# Patient Record
Sex: Male | Born: 1955
Health system: Southern US, Community
[De-identification: ages and names within clinical notes are randomized; demographics above are authoritative.]

## PROBLEM LIST (undated history)

## (undated) DIAGNOSIS — F909 Attention-deficit hyperactivity disorder, unspecified type: Secondary | ICD-10-CM

## (undated) DIAGNOSIS — T7840XA Allergy, unspecified, initial encounter: Secondary | ICD-10-CM

## (undated) DIAGNOSIS — N529 Male erectile dysfunction, unspecified: Secondary | ICD-10-CM

## (undated) DIAGNOSIS — F3181 Bipolar II disorder: Secondary | ICD-10-CM

## (undated) DIAGNOSIS — F419 Anxiety disorder, unspecified: Secondary | ICD-10-CM

## (undated) DIAGNOSIS — E785 Hyperlipidemia, unspecified: Secondary | ICD-10-CM

## (undated) DIAGNOSIS — F32A Depression, unspecified: Secondary | ICD-10-CM

## (undated) DIAGNOSIS — G0490 Encephalitis and encephalomyelitis, unspecified: Secondary | ICD-10-CM

## (undated) DIAGNOSIS — E559 Vitamin D deficiency, unspecified: Secondary | ICD-10-CM

## (undated) DIAGNOSIS — F329 Major depressive disorder, single episode, unspecified: Secondary | ICD-10-CM

## (undated) DIAGNOSIS — K5790 Diverticulosis of intestine, part unspecified, without perforation or abscess without bleeding: Secondary | ICD-10-CM

## (undated) DIAGNOSIS — Z86010 Personal history of colonic polyps: Secondary | ICD-10-CM

## (undated) HISTORY — PX: MOUTH SURGERY: SHX715

## (undated) HISTORY — DX: Allergy, unspecified, initial encounter: T78.40XA

## (undated) HISTORY — DX: Attention-deficit hyperactivity disorder, unspecified type: F90.9

## (undated) HISTORY — PX: BRAIN SURGERY: SHX531

## (undated) HISTORY — DX: Hyperlipidemia, unspecified: E78.5

## (undated) HISTORY — DX: Depression, unspecified: F32.A

## (undated) HISTORY — DX: Bipolar II disorder: F31.81

## (undated) HISTORY — DX: Vitamin D deficiency, unspecified: E55.9

## (undated) HISTORY — DX: Encephalitis and encephalomyelitis, unspecified: G04.90

## (undated) HISTORY — DX: Major depressive disorder, single episode, unspecified: F32.9

## (undated) HISTORY — PX: COLONOSCOPY: SHX174

## (undated) HISTORY — DX: Anxiety disorder, unspecified: F41.9

## (undated) HISTORY — DX: Male erectile dysfunction, unspecified: N52.9

## (undated) HISTORY — PX: TOE SURGERY: SHX1073

---
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
## 2016-07-10 ENCOUNTER — Ambulatory Visit (INDEPENDENT_AMBULATORY_CARE_PROVIDER_SITE_OTHER): Payer: 59 | Admitting: Family Medicine

## 2016-07-10 ENCOUNTER — Telehealth: Payer: Self-pay

## 2016-07-10 ENCOUNTER — Encounter: Payer: Self-pay | Admitting: Family Medicine

## 2016-07-10 VITALS — BP 134/78 | HR 63 | Temp 98.3°F | Ht 69.0 in | Wt 187.8 lb

## 2016-07-10 DIAGNOSIS — E785 Hyperlipidemia, unspecified: Secondary | ICD-10-CM | POA: Diagnosis not present

## 2016-07-10 DIAGNOSIS — F909 Attention-deficit hyperactivity disorder, unspecified type: Secondary | ICD-10-CM

## 2016-07-10 DIAGNOSIS — F3181 Bipolar II disorder: Secondary | ICD-10-CM

## 2016-07-10 DIAGNOSIS — N529 Male erectile dysfunction, unspecified: Secondary | ICD-10-CM | POA: Diagnosis not present

## 2016-07-10 MED ORDER — ARIPIPRAZOLE 5 MG PO TABS: 5.0000 mg | ORAL_TABLET | Freq: Every day | ORAL | 3 refills | Status: DC

## 2016-07-10 MED ORDER — AMPHETAMINE-DEXTROAMPHETAMINE 20 MG PO TABS: 20.0000 mg | ORAL_TABLET | Freq: Two times a day (BID) | ORAL | 0 refills | Status: DC

## 2016-07-10 MED ORDER — AMITRIPTYLINE HCL 10 MG PO TABS: 10.0000 mg | ORAL_TABLET | Freq: Every day | ORAL | 3 refills | Status: DC

## 2016-07-10 MED ORDER — ESCITALOPRAM OXALATE 20 MG PO TABS: 20.0000 mg | ORAL_TABLET | Freq: Every day | ORAL | 3 refills | Status: DC

## 2016-07-10 NOTE — Telephone Encounter (Signed)
ROI faxed to [facility] and [place].

## 2016-07-10 NOTE — Progress Notes (Signed)
Michael Durham is a 61 y.o. male is here to [place].   Patient Care Team: Briscoe Deutscher, DO as PCP - General (Family Medicine)   History of Present Illness:   Michael Durham, CMA, acting as scribe for Dr. Juleen China.  HPI  Patient comes in today to establish care.  Needs refills on his medications, but cannot remember the names of his medications with the exception of Abilify.  Recently moved to the area and needs a new PCP.  Previously was being seen in [place].  No concerns or complaints today.    Health Maintenance Due  Topic Date Due  . Hepatitis C Screening  [birth year]  . HIV Screening  07/22/1968  . TETANUS/TDAP  07/22/1972  . COLONOSCOPY  07/23/2003   PMHx, SurgHx, SocialHx, Medications, and Allergies were reviewed in the Visit Navigator and updated as appropriate.   Past Medical History:  Diagnosis Date  . ADHD   . Anxiety   . Bipolar 2 disorder (Michael Durham)   . Depression   . ED (erectile dysfunction)   . Encephalitis, Age 67   . HLD (hyperlipidemia)   . Vitamin D deficiency    Past Surgical History:  Procedure Laterality Date  . BRAIN SURGERY     Family History  Problem Relation Age of Onset  . Cancer Father   . Diabetes Brother    Social History  Substance Use Topics  . Smoking status: Former Research scientist (life sciences)  . Smokeless tobacco: Never Used  . Alcohol use Yes     Comment: occasionally   Current Medications and Allergies:   Current Outpatient Prescriptions:  .  amitriptyline (ELAVIL) 10 MG tablet, Take 1-2 tablets (10-20 mg total) by mouth at bedtime., Disp: 60 tablet, Rfl: 3 .  ARIPiprazole (ABILIFY) 5 MG tablet, Take 1 tablet (5 mg total) by mouth at bedtime., Disp: 30 tablet, Rfl: 3 .  atorvastatin (LIPITOR) 20 MG tablet, Take 20 mg by mouth daily., Disp: , Rfl: 2 .  Cholecalciferol (VITAMIN D) 2000 units CAPS, Take 2,000 Units by mouth daily., Disp: , Rfl:  .  escitalopram (LEXAPRO) 20 MG tablet, Take 1 tablet (20 mg total) by mouth daily., Disp: 30  tablet, Rfl: 3 .  tadalafil (CIALIS) 20 MG tablet, Take 20 mg by mouth daily as needed for erectile dysfunction., Disp: , Rfl:  .  amphetamine-dextroamphetamine (ADDERALL) 20 MG tablet, Take 1 tablet (20 mg total) by mouth 2 (two) times daily., Disp: 60 tablet, Rfl: 0  Allergies  Allergen Reactions  . Michael Durham [Bupropion] Other (See Comments)    Severe headache.   Review of Systems:   Review of Systems  All other systems reviewed and are negative.  Vitals:   Vitals:   07/10/16 1411  BP: 134/78  Pulse: 63  Temp: 98.3 F (36.8 C)  TempSrc: Oral  SpO2: 98%  Weight: 187 lb 12.8 oz (85.2 kg)  Height: 5\' 9"  (1.753 m)     Body mass index is 27.73 kg/m.  Physical Exam:   Physical Exam  Constitutional: He is oriented to person, place, and time. He appears well-developed and well-nourished. No distress.  HENT:  Head: Normocephalic and atraumatic.  Right Ear: External ear normal.  Left Ear: External ear normal.  Nose: Nose normal.  Mouth/Throat: Oropharynx is clear and moist.  Eyes: Conjunctivae and EOM are normal. Pupils are equal, round, and reactive to light.  Neck: Normal range of motion. Neck supple.  Cardiovascular: Normal rate, regular rhythm, normal heart sounds and intact distal pulses.  Pulmonary/Chest: Effort normal and breath sounds normal.  Abdominal: Soft. Bowel sounds are normal.  Musculoskeletal: Normal range of motion.  Neurological: He is alert and oriented to person, place, and time.  Skin: Skin is warm and dry.  Psychiatric: He has a normal mood and affect. His behavior is normal. Judgment and thought content normal.  Nursing note and vitals reviewed.  Assessment and Plan:   Michael Durham was seen today for establish care.  Diagnoses and all orders for this visit:  Adult ADHD Comments: One month refill today. Requesting records. Controlled substance contract signed.  Orders: -     amphetamine-dextroamphetamine (ADDERALL) 20 MG tablet; Take 1 tablet (20  mg total) by mouth 2 (two) times daily.  Hyperlipidemia, unspecified hyperlipidemia type Comments: No signs of complications, medication side effects, or red flags. Continue current regimen.    Erectile dysfunction, unspecified erectile dysfunction type Comments: No signs of complications, medication side effects, or red flags. Continue current regimen.    Bipolar 2 disorder ([place]) Comments: No signs of complications, medication side effects, or red flags. Continue current regimen.   Orders: -     ARIPiprazole (ABILIFY) 5 MG tablet; Take 1 tablet (5 mg total) by mouth at bedtime. -     escitalopram (LEXAPRO) 20 MG tablet; Take 1 tablet (20 mg total) by mouth daily.   . Reviewed expectations re: course of current medical issues. . Discussed self-management of symptoms. . Outlined signs and symptoms indicating need for more acute intervention. . Patient verbalized understanding and all questions were answered. Marland Kitchen Health Maintenance issues including appropriate healthy diet, exercise, and smoking avoidance were discussed with patient. . See orders for this visit as documented in the electronic medical record. . Patient received an After Visit Summary.  CMA served as Education administrator during this visit. History, Physical, and Plan performed by medical provider. The above documentation has been reviewed and is accurate and complete. Briscoe Deutscher, D.O.  Records requested if needed. Time spent with the patient: 30 minutes, of which >50% was spent in obtaining information about his symptoms, reviewing his previous labs, evaluations, and treatments, counseling him about his condition (please see the discussed topics above), and developing a plan to further investigate it; he had a number of questions which I addressed.   Briscoe Deutscher, DO [place] 07/12/2016  Future Appointments Date Time Provider [place]  08/11/2016 9:00 AM Briscoe Deutscher, DO [facility] None

## 2016-07-12 ENCOUNTER — Encounter: Payer: Self-pay | Admitting: Family Medicine

## 2016-07-12 DIAGNOSIS — N529 Male erectile dysfunction, unspecified: Secondary | ICD-10-CM | POA: Insufficient documentation

## 2016-07-12 DIAGNOSIS — F909 Attention-deficit hyperactivity disorder, unspecified type: Secondary | ICD-10-CM | POA: Insufficient documentation

## 2016-07-12 DIAGNOSIS — F3181 Bipolar II disorder: Secondary | ICD-10-CM | POA: Insufficient documentation

## 2016-07-12 DIAGNOSIS — E785 Hyperlipidemia, unspecified: Secondary | ICD-10-CM | POA: Insufficient documentation

## 2016-08-11 ENCOUNTER — Encounter: Payer: Self-pay | Admitting: Family Medicine

## 2016-08-11 ENCOUNTER — Ambulatory Visit (INDEPENDENT_AMBULATORY_CARE_PROVIDER_SITE_OTHER): Payer: 59 | Admitting: Family Medicine

## 2016-08-11 VITALS — BP 126/80 | HR 60 | Temp 97.8°F | Ht 69.0 in | Wt 190.0 lb

## 2016-08-11 DIAGNOSIS — F909 Attention-deficit hyperactivity disorder, unspecified type: Secondary | ICD-10-CM | POA: Insufficient documentation

## 2016-08-11 DIAGNOSIS — F902 Attention-deficit hyperactivity disorder, combined type: Secondary | ICD-10-CM

## 2016-08-11 DIAGNOSIS — R6889 Other general symptoms and signs: Secondary | ICD-10-CM

## 2016-08-11 DIAGNOSIS — F3181 Bipolar II disorder: Secondary | ICD-10-CM

## 2016-08-11 LAB — COMPREHENSIVE METABOLIC PANEL
ALT: 20 U/L (ref 0–53)
AST: 15 U/L (ref 0–37)
Albumin: 4.7 g/dL (ref 3.5–5.2)
Alkaline Phosphatase: 55 U/L (ref 39–117)
BUN: 10 mg/dL (ref 6–23)
CO2: 29 mEq/L (ref 19–32)
Calcium: 9.6 mg/dL (ref 8.4–10.5)
Chloride: 103 mEq/L (ref 96–112)
Creatinine, Ser: 1.11 mg/dL (ref 0.40–1.50)
GFR: 71.1 mL/min (ref >60.00)
Glucose, Bld: 102 mg/dL — ABNORMAL HIGH (ref 70–99)
Potassium: 5 mEq/L (ref 3.5–5.1)
Sodium: 137 mEq/L (ref 135–145)
Total Bilirubin: 0.5 mg/dL (ref 0.2–1.2)
Total Protein: 7.3 g/dL (ref 6.0–8.3)

## 2016-08-11 LAB — TSH: TSH: 2.44 u[IU]/mL (ref 0.35–4.50)

## 2016-08-11 LAB — CBC WITH DIFFERENTIAL/PLATELET
Basophils Absolute: 0.1 10*3/uL (ref 0.0–0.1)
Basophils Relative: 1.4 % (ref 0.0–3.0)
Eosinophils Absolute: 0.1 10*3/uL (ref 0.0–0.7)
Eosinophils Relative: 1.6 % (ref 0.0–5.0)
HCT: 47.8 % (ref 39.0–52.0)
Hemoglobin: 15.8 g/dL (ref 13.0–17.0)
Lymphocytes Relative: 34.3 % (ref 12.0–46.0)
Lymphs Abs: 2.1 10*3/uL (ref 0.7–4.0)
MCHC: 33.2 g/dL (ref 30.0–36.0)
MCV: 93.9 fl (ref 78.0–100.0)
Monocytes Absolute: 0.5 10*3/uL (ref 0.1–1.0)
Monocytes Relative: 7.7 % (ref 3.0–12.0)
Neutro Abs: 3.4 10*3/uL (ref 1.4–7.7)
Neutrophils Relative %: 55 % (ref 43.0–77.0)
Platelets: 247 10*3/uL (ref 150.0–400.0)
RBC: 5.09 Mil/uL (ref 4.22–5.81)
RDW: 13.6 % (ref 11.5–15.5)
WBC: 6.3 10*3/uL (ref 4.0–10.5)

## 2016-08-11 MED ORDER — AMPHETAMINE-DEXTROAMPHETAMINE 20 MG PO TABS: 20.0000 mg | ORAL_TABLET | Freq: Two times a day (BID) | ORAL | 0 refills | Status: DC

## 2016-08-11 NOTE — Progress Notes (Signed)
Michael Durham is a 61 y.o. male is here for follow up.  History of Present Illness:   Water quality scientist, CMA, acting as scribe for Dr. Juleen Durham.  HPI:  Patient comes in today for follow up.  States that at last visit he did not get his prescription for his regular release Adderall.  He has not been taking his Adderall since last visit because it is long-acting and he states this keeps him awake at night.  He has been taking all his other medications without difficulty.  States that he occasionally will get very cold to the point that he begins shaking uncontrollably.  He states this will last for several minutes.    Health Maintenance Due  Topic Date Due  . Hepatitis C Screening  12-09-1955  . HIV Screening  07/22/1968  . TETANUS/TDAP  07/22/1972  . COLONOSCOPY  07/23/2003  . INFLUENZA VACCINE  08/06/2016   Depression screen PHQ 2/9 07/10/2016  Decreased Interest 0  Down, Depressed, Hopeless 1  PHQ - 2 Score 1  Altered sleeping 0  Change in appetite 0  Feeling bad or failure about yourself  0  Trouble concentrating 1  Moving slowly or fidgety/restless 0  Suicidal thoughts 0  PHQ-9 Score 2   PMHx, SurgHx, SocialHx, FamHx, Medications, and Allergies were reviewed in the Visit Navigator and updated as appropriate.   Patient Active Problem List   Diagnosis Date Noted  . HLD (hyperlipidemia)   . ADHD   . ED (erectile dysfunction)   . Bipolar 2 disorder ([place])   . Adult ADHD 07/10/2016  . Depression 07/10/2016  . Anxiety 07/10/2016   Social History  Substance Use Topics  . Smoking status: Former Research scientist (life sciences)  . Smokeless tobacco: Never Used  . Alcohol use Yes     Comment: occasionally   Current Medications and Allergies:   Current Outpatient Prescriptions:  .  amitriptyline (ELAVIL) 10 MG tablet, Take 1-2 tablets (10-20 mg total) by mouth at bedtime., Disp: 60 tablet, Rfl: 3 .  ARIPiprazole (ABILIFY) 5 MG tablet, Take 1 tablet (5 mg total) by mouth at bedtime., Disp: 30  tablet, Rfl: 3 .  atorvastatin (LIPITOR) 20 MG tablet, Take 20 mg by mouth daily., Disp: , Rfl: 2 .  Cholecalciferol (VITAMIN D) 2000 units CAPS, Take 2,000 Units by mouth daily., Disp: , Rfl:  .  escitalopram (LEXAPRO) 20 MG tablet, Take 1 tablet (20 mg total) by mouth daily., Disp: 30 tablet, Rfl: 3 .  tadalafil (CIALIS) 20 MG tablet, Take 20 mg by mouth daily as needed for erectile dysfunction., Disp: , Rfl:  .  amphetamine-dextroamphetamine (ADDERALL) 20 MG tablet, Take 1 tablet (20 mg total) by mouth 2 (two) times daily. (Patient not taking: Reported on 08/11/2016), Disp: 60 tablet, Rfl: 0  Allergies  Allergen Reactions  . Wellbutrin [Bupropion] Other (See Comments)    Severe headache.   Review of Systems   Pertinent items are noted in the HPI. Otherwise, ROS is negative.  Vitals:   Vitals:   08/11/16 0908  BP: 126/80  Pulse: 60  Temp: 97.8 F (36.6 C)  TempSrc: Oral  SpO2: 99%  Weight: 190 lb (86.2 kg)  Height: 5\' 9"  (1.753 m)     Body mass index is 28.06 kg/m.  Physical Exam:   Physical Exam  Constitutional: He is oriented to person, place, and time. He appears well-developed and well-nourished. No distress.  HENT:  Head: Normocephalic and atraumatic.  Right Ear: External ear normal.  Left Ear:  External ear normal.  Nose: Nose normal.  Mouth/Throat: Oropharynx is clear and moist.  Eyes: Pupils are equal, round, and reactive to light. Conjunctivae and EOM are normal.  Neck: Normal range of motion. Neck supple.  Cardiovascular: Normal rate, regular rhythm, normal heart sounds and intact distal pulses.   Pulmonary/Chest: Effort normal and breath sounds normal.  Abdominal: Soft. Bowel sounds are normal.  Musculoskeletal: Normal range of motion.  Neurological: He is alert and oriented to person, place, and time.  Skin: Skin is warm and dry.  Psychiatric: He has a normal mood and affect. His behavior is normal. Judgment and thought content normal.  Nursing note and  vitals reviewed.   Assessment and Plan:   Michael Durham was seen today for follow-up.  Diagnoses and all orders for this visit:  Attention deficit hyperactivity disorder (ADHD), combined type -     amphetamine-dextroamphetamine (ADDERALL) 20 MG tablet; Take 1 tablet (20 mg total) by mouth 2 (two) times daily. -     amphetamine-dextroamphetamine (ADDERALL) 20 MG tablet; Take 1 tablet (20 mg total) by mouth 2 (two) times daily. -     amphetamine-dextroamphetamine (ADDERALL) 20 MG tablet; Take 1 tablet (20 mg total) by mouth 2 (two) times daily.  Bipolar 2 disorder ([place]) Comments: Doing well. Continue current regimen.   Cold feeling Comments: Will check labs. Orders: -     CBC with Differential/Platelet; Future -     Comprehensive metabolic panel -     TSH -     CBC with Differential/Platelet   . Reviewed expectations re: course of current medical issues. . Discussed self-management of symptoms. . Outlined signs and symptoms indicating need for more acute intervention. . Patient verbalized understanding and all questions were answered. Marland Kitchen Health Maintenance issues including appropriate healthy diet, exercise, and smoking avoidance were discussed with patient. . See orders for this visit as documented in the electronic medical record. . Patient received an After Visit Summary.  CMA served as Education administrator during this visit. History, Physical, and Plan performed by medical provider. The above documentation has been reviewed and is accurate and complete. Michael Durham, D.O.  Michael Deutscher, DO [place], [facility] 08/11/2016  Future Appointments Date Time Provider [place]  11/12/2016 9:45 AM Michael Deutscher, DO [facility] None

## 2016-10-31 ENCOUNTER — Other Ambulatory Visit: Payer: Self-pay | Admitting: Family Medicine

## 2016-10-31 DIAGNOSIS — F3181 Bipolar II disorder: Secondary | ICD-10-CM

## 2016-11-12 ENCOUNTER — Encounter: Payer: Self-pay | Admitting: Family Medicine

## 2016-11-12 ENCOUNTER — Ambulatory Visit (INDEPENDENT_AMBULATORY_CARE_PROVIDER_SITE_OTHER): Payer: 59 | Admitting: Family Medicine

## 2016-11-12 VITALS — BP 130/88 | HR 76 | Temp 98.1°F | Ht 69.0 in | Wt 190.8 lb

## 2016-11-12 DIAGNOSIS — G4700 Insomnia, unspecified: Secondary | ICD-10-CM

## 2016-11-12 DIAGNOSIS — F909 Attention-deficit hyperactivity disorder, unspecified type: Secondary | ICD-10-CM | POA: Diagnosis not present

## 2016-11-12 DIAGNOSIS — F3181 Bipolar II disorder: Secondary | ICD-10-CM

## 2016-11-12 DIAGNOSIS — E559 Vitamin D deficiency, unspecified: Secondary | ICD-10-CM | POA: Insufficient documentation

## 2016-11-12 DIAGNOSIS — Z23 Encounter for immunization: Secondary | ICD-10-CM

## 2016-11-12 DIAGNOSIS — E785 Hyperlipidemia, unspecified: Secondary | ICD-10-CM | POA: Diagnosis not present

## 2016-11-12 DIAGNOSIS — L819 Disorder of pigmentation, unspecified: Secondary | ICD-10-CM

## 2016-11-12 DIAGNOSIS — F331 Major depressive disorder, recurrent, moderate: Secondary | ICD-10-CM | POA: Insufficient documentation

## 2016-11-12 LAB — POCT GLYCOSYLATED HEMOGLOBIN (HGB A1C): Hemoglobin A1C: 5.6

## 2016-11-12 MED ORDER — AMPHETAMINE-DEXTROAMPHETAMINE 20 MG PO TABS: 20.0000 mg | ORAL_TABLET | Freq: Two times a day (BID) | ORAL | 0 refills | Status: DC

## 2016-11-12 MED ORDER — ATORVASTATIN CALCIUM 20 MG PO TABS: 20.0000 mg | ORAL_TABLET | Freq: Every day | ORAL | 2 refills | Status: DC

## 2016-11-12 MED ORDER — ARIPIPRAZOLE 5 MG PO TABS
ORAL_TABLET | ORAL | 1 refills | Status: DC
Start: 2016-11-12 — End: 2017-04-01

## 2016-11-12 MED ORDER — ESCITALOPRAM OXALATE 20 MG PO TABS
ORAL_TABLET | ORAL | 2 refills | Status: DC
Start: 2016-11-12 — End: 2017-04-01

## 2016-11-12 MED ORDER — AMITRIPTYLINE HCL 10 MG PO TABS: ORAL_TABLET | ORAL | 3 refills | Status: DC

## 2016-11-12 NOTE — Progress Notes (Signed)
The 10-year ASCVD risk score Mikey Bussing DC Brooke Bonito., et al., 2013) is: 9.1%   Values used to calculate the score:     Age: 61 years     Sex: Male     Is Non-Hispanic African American: No     Diabetic: No     Tobacco smoker: No     Systolic Blood Pressure: 161 mmHg     Is BP treated: No     HDL Cholesterol: 52 mg/dL     Total Cholesterol: 158 mg/dL

## 2016-11-12 NOTE — Progress Notes (Signed)
Michael Durham is a 61 y.o. male is here for follow up.  History of Present Illness:   HPI:   1. Adult ADHD. Since the last visit has the patient had any:  Appetite changes? No Unintentional weight loss? No Is medication working well ? Yes Does patient take drug holidays? No Difficulties falling to sleep or maintaining sleep? No Any anxiety?  No Any cardiac issues (fainting or paliptations)? No Suicidal thoughts? No Changes in health since last visit? No New medications? No Any illicit substance abuse? No Has the patient taken his medication today? Yes    2. Atypical pigmented skin lesion. Flank. Unknown length of time. Does not know if it has change. No itch.    3. Bipolar 2 disorder ([place]). Stable on current regimen.    4. Hyperlipidemia. Needs refills. Tolerates medication without issues.   6. Insomnia. Needs refills. No concerns.    Health Maintenance Due  Topic Date Due  . Hepatitis C Screening  1955-09-11  . HIV Screening  07/22/1968  . TETANUS/TDAP  07/22/1972  . COLONOSCOPY  07/23/2003  . INFLUENZA VACCINE  08/06/2016   Depression screen PHQ 2/9 07/10/2016  Decreased Interest 0  Down, Depressed, Hopeless 1  PHQ - 2 Score 1  Altered sleeping 0  Change in appetite 0  Feeling bad or failure about yourself  0  Trouble concentrating 1  Moving slowly or fidgety/restless 0  Suicidal thoughts 0  PHQ-9 Score 2   PMHx, SurgHx, SocialHx, FamHx, Medications, and Allergies were reviewed in the Visit Navigator and updated as appropriate.   Patient Active Problem List   Diagnosis Date Noted  . Vitamin D deficiency 11/12/2016  . Recurrent major depressive episodes, moderate ([place]) 11/12/2016  . Adult ADHD 08/11/2016  . Cold feeling 08/11/2016  . HLD (hyperlipidemia)   . ADHD   . ED (erectile dysfunction)   . Bipolar 2 disorder ([facility])    Social History   Tobacco Use  . Smoking status: Former Research scientist (life sciences)  . Smokeless tobacco: Never Used  Substance Use Topics  .  Alcohol use: Yes    Comment: occasionally  . Drug use: No   Current Medications and Allergies:   .  amitriptyline (ELAVIL) 10 MG tablet, TAKE 1 TO 2 TABLETS(10 TO 20 MG) BY MOUTH AT BEDTIME, Disp: 60 tablet, Rfl: 0 .  amphetamine-dextroamphetamine (ADDERALL) 20 MG tablet, Take 1 tablet (20 mg total) by mouth 2 (two) times daily., Disp: 60 tablet, Rfl: 0 .  amphetamine-dextroamphetamine (ADDERALL) 20 MG tablet, Take 1 tablet (20 mg total) by mouth 2 (two) times daily., Disp: 60 tablet, Rfl: 0 .  amphetamine-dextroamphetamine (ADDERALL) 20 MG tablet, Take 1 tablet (20 mg total) by mouth 2 (two) times daily., Disp: 60 tablet, Rfl: 0 .  ARIPiprazole (ABILIFY) 5 MG tablet, TAKE 1 TABLET(5 MG) BY MOUTH AT BEDTIME, Disp: 30 tablet, Rfl: 0 .  atorvastatin (LIPITOR) 20 MG tablet, Take 20 mg by mouth daily., Disp: , Rfl: 2 .  Cholecalciferol (VITAMIN D) 2000 units CAPS, Take 2,000 Units by mouth daily., Disp: , Rfl:  .  escitalopram (LEXAPRO) 20 MG tablet, TAKE 1 TABLET(20 MG) BY MOUTH DAILY, Disp: 30 tablet, Rfl: 0 .  tadalafil (CIALIS) 20 MG tablet, Take 20 mg by mouth daily as needed for erectile dysfunction., Disp: , Rfl:   Allergies  Allergen Reactions  . Wellbutrin [Bupropion] Other (See Comments)    Severe headache.   Review of Systems   Pertinent items are noted in the HPI. Otherwise,  ROS is negative.  Vitals:   Vitals:   11/12/16 0950  BP: 130/88  Pulse: 76  Temp: 98.1 F (36.7 C)  TempSrc: Oral  SpO2: 97%  Weight: 190 lb 12.8 oz (86.5 kg)  Height: 5\' 9"  (1.753 m)     Body mass index is 28.18 kg/m.   Physical Exam:   Physical Exam  Constitutional: He is oriented to person, place, and time. He appears well-developed and well-nourished. No distress.  HENT:  Head: Normocephalic and atraumatic.  Right Ear: External ear normal.  Left Ear: External ear normal.  Nose: Nose normal.  Mouth/Throat: Oropharynx is clear and moist.  Eyes: Conjunctivae and EOM are normal. Pupils  are equal, round, and reactive to light.  Neck: Normal range of motion. Neck supple.  Cardiovascular: Normal rate, regular rhythm, normal heart sounds and intact distal pulses.  Pulmonary/Chest: Effort normal and breath sounds normal.  Abdominal: Soft. Bowel sounds are normal.  Musculoskeletal: Normal range of motion.  Neurological: He is alert and oriented to person, place, and time.  Skin: Skin is warm and dry.  Irregular 5 mm hyperpigmented lesion on right flank.  Psychiatric: He has a normal mood and affect. His behavior is normal. Judgment and thought content normal.  Nursing note and vitals reviewed.   Results for orders placed or performed in visit on 08/11/16  Comprehensive metabolic panel  Result Value Ref Range   Sodium 137 135 - 145 mEq/L   Potassium 5.0 3.5 - 5.1 mEq/L   Chloride 103 96 - 112 mEq/L   CO2 29 19 - 32 mEq/L   Glucose, Bld 102 (H) 70 - 99 mg/dL   BUN 10 6 - 23 mg/dL   Creatinine, Ser 1.11 0.40 - 1.50 mg/dL   Total Bilirubin 0.5 0.2 - 1.2 mg/dL   Alkaline Phosphatase 55 39 - 117 U/L   AST 15 0 - 37 U/L   ALT 20 0 - 53 U/L   Total Protein 7.3 6.0 - 8.3 g/dL   Albumin 4.7 3.5 - 5.2 g/dL   Calcium 9.6 8.4 - 10.5 mg/dL   GFR 71.10 >60.00 mL/min  TSH  Result Value Ref Range   TSH 2.44 0.35 - 4.50 uIU/mL  CBC with Differential/Platelet  Result Value Ref Range   WBC 6.3 4.0 - 10.5 K/uL   RBC 5.09 4.22 - 5.81 Mil/uL   Hemoglobin 15.8 13.0 - 17.0 g/dL   HCT 47.8 39.0 - 52.0 %   MCV 93.9 78.0 - 100.0 fl   MCHC 33.2 30.0 - 36.0 g/dL   RDW 13.6 11.5 - 15.5 %   Platelets 247.0 150.0 - 400.0 K/uL   Neutrophils Relative % 55.0 43.0 - 77.0 %   Lymphocytes Relative 34.3 12.0 - 46.0 %   Monocytes Relative 7.7 3.0 - 12.0 %   Eosinophils Relative 1.6 0.0 - 5.0 %   Basophils Relative 1.4 0.0 - 3.0 %   Neutro Abs 3.4 1.4 - 7.7 K/uL   Lymphs Abs 2.1 0.7 - 4.0 K/uL   Monocytes Absolute 0.5 0.1 - 1.0 K/uL   Eosinophils Absolute 0.1 0.0 - 0.7 K/uL   Basophils  Absolute 0.1 0.0 - 0.1 K/uL   Assessment and Plan:   Diagnoses and all orders for this visit:  Adult ADHD -     amphetamine-dextroamphetamine (ADDERALL) 20 MG tablet; Take 1 tablet (20 mg total) 2 (two) times daily by mouth. -     amphetamine-dextroamphetamine (ADDERALL) 20 MG tablet; Take 1 tablet (20 mg total) 2 (  two) times daily by mouth. -     amphetamine-dextroamphetamine (ADDERALL) 20 MG tablet; Take 1 tablet (20 mg total) 2 (two) times daily by mouth.  Atypical pigmented skin lesion Comments: Patient will return for lesion removal/biopsy.  Bipolar 2 disorder ([facility]) -     escitalopram (LEXAPRO) 20 MG tablet; TAKE 1 TABLET(20 MG) BY MOUTH DAILY -     ARIPiprazole (ABILIFY) 5 MG tablet; TAKE 1 TABLET(5 MG) BY MOUTH AT BEDTIME  Hyperlipidemia, unspecified hyperlipidemia type -     atorvastatin (LIPITOR) 20 MG tablet; Take 1 tablet (20 mg total) daily by mouth. -     POCT glycosylated hemoglobin (Hb A1C)  Need for immunization against influenza -     Flu Vaccine QUAD 36+ mos IM  Insomnia, unspecified type -     amitriptyline (ELAVIL) 10 MG tablet; TAKE 1 TO 2 TABLETS(10 TO 20 MG) BY MOUTH AT BEDTIME   . Reviewed expectations re: course of current medical issues. . Discussed self-management of symptoms. . Outlined signs and symptoms indicating need for more acute intervention. . Patient verbalized understanding and all questions were answered. Marland Kitchen Health Maintenance issues including appropriate healthy diet, exercise, and smoking avoidance were discussed with patient. . See orders for this visit as documented in the electronic medical record. . Patient received an After Visit Summary.  Briscoe Deutscher, DO [place], [facility] 11/12/2016

## 2016-11-12 NOTE — Patient Instructions (Signed)
Schedule another appointment to remove the lesion on your right flank.

## 2016-11-19 ENCOUNTER — Ambulatory Visit: Payer: 59 | Admitting: Family Medicine

## 2016-12-03 ENCOUNTER — Encounter: Payer: Self-pay | Admitting: Family Medicine

## 2016-12-03 ENCOUNTER — Ambulatory Visit (INDEPENDENT_AMBULATORY_CARE_PROVIDER_SITE_OTHER): Payer: 59 | Admitting: Family Medicine

## 2016-12-03 VITALS — BP 122/84 | HR 67 | Temp 98.1°F | Ht 69.0 in | Wt 197.0 lb

## 2016-12-03 DIAGNOSIS — L989 Disorder of the skin and subcutaneous tissue, unspecified: Secondary | ICD-10-CM | POA: Diagnosis not present

## 2016-12-03 NOTE — Progress Notes (Signed)
Punch Biopsy  Indication: suspicious lesion Location: right flank Size: 4 x 4 mm Verbal informed consent obtained.  Pt aware of risks not limited to but including infection, bleeding, damage to near by organs. Prep: etoh/betadine Anesthesia: 1% lidocaine with epi, good effect Punch made with 3 mm punch Tolerated well, without no blood loss Routine postprocedure instructions d/w pt- keep area clean and bandaged, follow up if concerns/spreading erythema/pain

## 2017-01-22 ENCOUNTER — Telehealth: Payer: Self-pay | Admitting: Family Medicine

## 2017-01-22 NOTE — Telephone Encounter (Signed)
Copied from [place] [id number]. Topic: General - Other >> Jan 22, 2017  3:31 PM Darl Householder, RMA wrote: Reason for CRM: Medication refill request for atorvastatin 20 mg to be sent to [facility] [address]

## 2017-01-22 NOTE — Telephone Encounter (Signed)
[facility]   On    [address]      Pt  Has   Atorvastatin     rx   Available   For  Pick  Up     Pt  Advised     Per  Phone

## 2017-02-12 ENCOUNTER — Ambulatory Visit: Payer: 59 | Admitting: Family Medicine

## 2017-02-12 NOTE — Progress Notes (Signed)
Schneur Mikah Poss is a 62 y.o. male is here for follow up.  History of Present Illness:   HPI:   Since the last visit has the patient had any:  Appetite changes? No Unintentional weight loss? No Is medication working well ? Yes Does patient take drug holidays? Yes Difficulties falling to sleep or maintaining sleep? No Any anxiety?  No Any cardiac issues (fainting or paliptations)? No Suicidal thoughts? No Changes in health since last visit? No New medications? No Any illicit substance abuse? No Has the patient taken his medication today? Yes  Health Maintenance Due  Topic Date Due  . Hepatitis C Screening  1955/05/20  . HIV Screening  07/22/1968  . COLONOSCOPY  07/23/2003   Depression screen PHQ 2/9 07/10/2016  Decreased Interest 0  Down, Depressed, Hopeless 1  PHQ - 2 Score 1  Altered sleeping 0  Change in appetite 0  Feeling bad or failure about yourself  0  Trouble concentrating 1  Moving slowly or fidgety/restless 0  Suicidal thoughts 0  PHQ-9 Score 2   PMHx, SurgHx, SocialHx, FamHx, Medications, and Allergies were reviewed in the Visit Navigator and updated as appropriate.   Patient Active Problem List   Diagnosis Date Noted  . Vitamin D deficiency 11/12/2016  . Recurrent major depressive episodes, moderate ([place]) 11/12/2016  . HLD (hyperlipidemia)   . ADHD   . ED (erectile dysfunction)   . Bipolar 2 disorder (HCC)    Social History   Tobacco Use  . Smoking status: Former Research scientist (life sciences)  . Smokeless tobacco: Never Used  Substance Use Topics  . Alcohol use: Yes    Comment: occasionally  . Drug use: No   Current Medications and Allergies:   .  amitriptyline (ELAVIL) 10 MG tablet, TAKE 1 TO 2 TABLETS(10 TO 20 MG) BY MOUTH AT BEDTIME, Disp: 60 tablet, Rfl: 3 .  amphetamine-dextroamphetamine (ADDERALL) 20 MG tablet, Take 1 tablet (20 mg total) 2 (two) times daily by mouth., Disp: 60 tablet, Rfl: 0 .  amphetamine-dextroamphetamine (ADDERALL) 20 MG tablet, Take  1 tablet (20 mg total) 2 (two) times daily by mouth., Disp: 60 tablet, Rfl: 0 .  amphetamine-dextroamphetamine (ADDERALL) 20 MG tablet, Take 1 tablet (20 mg total) 2 (two) times daily by mouth., Disp: 60 tablet, Rfl: 0 .  ARIPiprazole (ABILIFY) 5 MG tablet, TAKE 1 TABLET(5 MG) BY MOUTH AT BEDTIME, Disp: 90 tablet, Rfl: 1 .  atorvastatin (LIPITOR) 20 MG tablet, Take 1 tablet (20 mg total) daily by mouth., Disp: 90 tablet, Rfl: 2 .  Cholecalciferol (VITAMIN D) 2000 units CAPS, Take 2,000 Units by mouth daily., Disp: , Rfl:  .  escitalopram (LEXAPRO) 20 MG tablet, TAKE 1 TABLET(20 MG) BY MOUTH DAILY, Disp: 90 tablet, Rfl: 2 .  tadalafil (CIALIS) 20 MG tablet, Take 20 mg by mouth daily as needed for erectile dysfunction., Disp: , Rfl:    Allergies  Allergen Reactions  . Wellbutrin [Bupropion] Other (See Comments)    Severe headache.   Review of Systems   Pertinent items are noted in the HPI. Otherwise, ROS is negative.  Vitals:   Vitals:   02/13/17 1005  BP: 130/82  Pulse: 88  Temp: 98 F (36.7 C)  TempSrc: Oral  SpO2: 98%  Weight: 189 lb 12.8 oz (86.1 kg)     Body mass index is 28.03 kg/m.   Physical Exam:   Physical Exam  Constitutional: He is oriented to person, place, and time. He appears well-developed and well-nourished. No distress.  HENT:  Head: Normocephalic and atraumatic.  Right Ear: External ear normal.  Left Ear: External ear normal.  Nose: Nose normal.  Mouth/Throat: Oropharynx is clear and moist.  Eyes: Conjunctivae and EOM are normal. Pupils are equal, round, and reactive to light.  Neck: Normal range of motion. Neck supple.  Cardiovascular: Normal rate, regular rhythm, normal heart sounds and intact distal pulses.  Pulmonary/Chest: Effort normal and breath sounds normal.  Abdominal: Soft. Bowel sounds are normal.  Musculoskeletal: Normal range of motion.  Neurological: He is alert and oriented to person, place, and time.  Skin: Skin is warm and dry.    Psychiatric: He has a normal mood and affect. His behavior is normal. Judgment and thought content normal.  Nursing note and vitals reviewed.   Assessment and Plan:   Jourdain was seen today for follow-up.  Diagnoses and all orders for this visit:  Mixed hyperlipidemia Comments: Due for FLP. No concerns with current treatment.  Orders: -     Comprehensive metabolic panel -     Lipid panel  Attention deficit hyperactivity disorder (ADHD), predominantly inattentive type Comments: Well controlled.  No signs of complications, medication side effects, or red flags.  Continue current regimen.    Orders: -     amphetamine-dextroamphetamine (ADDERALL) 20 MG tablet; Take 1 tablet (20 mg total) by mouth 2 (two) times daily. -     amphetamine-dextroamphetamine (ADDERALL) 20 MG tablet; Take 1 tablet (20 mg total) by mouth 2 (two) times daily. -     amphetamine-dextroamphetamine (ADDERALL) 20 MG tablet; Take 1 tablet (20 mg total) by mouth 2 (two) times daily.  Screening for HIV (human immunodeficiency virus) -     HIV antibody  Encounter for hepatitis C virus screening test for high risk patient -     Hepatitis C antibody   . Reviewed expectations re: course of current medical issues. . Discussed self-management of symptoms. . Outlined signs and symptoms indicating need for more acute intervention. . Patient verbalized understanding and all questions were answered. Marland Kitchen Health Maintenance issues including appropriate healthy diet, exercise, and smoking avoidance were discussed with patient. . See orders for this visit as documented in the electronic medical record. . Patient received an After Visit Summary.  Briscoe Deutscher, DO [place] 02/13/2017  No future appointments.

## 2017-02-13 ENCOUNTER — Encounter: Payer: Self-pay | Admitting: Family Medicine

## 2017-02-13 ENCOUNTER — Ambulatory Visit (INDEPENDENT_AMBULATORY_CARE_PROVIDER_SITE_OTHER): Payer: 59 | Admitting: Family Medicine

## 2017-02-13 VITALS — BP 130/82 | HR 88 | Temp 98.0°F | Wt 189.8 lb

## 2017-02-13 DIAGNOSIS — Z114 Encounter for screening for human immunodeficiency virus [HIV]: Secondary | ICD-10-CM | POA: Diagnosis not present

## 2017-02-13 DIAGNOSIS — F900 Attention-deficit hyperactivity disorder, predominantly inattentive type: Secondary | ICD-10-CM

## 2017-02-13 DIAGNOSIS — E782 Mixed hyperlipidemia: Secondary | ICD-10-CM

## 2017-02-13 DIAGNOSIS — Z1159 Encounter for screening for other viral diseases: Secondary | ICD-10-CM | POA: Diagnosis not present

## 2017-02-13 DIAGNOSIS — Z9189 Other specified personal risk factors, not elsewhere classified: Secondary | ICD-10-CM

## 2017-02-13 LAB — LIPID PANEL
Cholesterol: 135 mg/dL (ref 0–200)
HDL: 41 mg/dL (ref >39.00)
LDL Cholesterol: 84 mg/dL (ref 0–99)
NonHDL: 93.58
Total CHOL/HDL Ratio: 3
Triglycerides: 48 mg/dL (ref 0.0–149.0)
VLDL: 9.6 mg/dL (ref 0.0–40.0)

## 2017-02-13 LAB — COMPREHENSIVE METABOLIC PANEL
ALT: 18 U/L (ref 0–53)
AST: 14 U/L (ref 0–37)
Albumin: 4.5 g/dL (ref 3.5–5.2)
Alkaline Phosphatase: 54 U/L (ref 39–117)
BUN: 21 mg/dL (ref 6–23)
CO2: 27 mEq/L (ref 19–32)
Calcium: 9.3 mg/dL (ref 8.4–10.5)
Chloride: 104 mEq/L (ref 96–112)
Creatinine, Ser: 1.05 mg/dL (ref 0.40–1.50)
GFR: 75.68 mL/min (ref >60.00)
Glucose, Bld: 98 mg/dL (ref 70–99)
Potassium: 4.3 mEq/L (ref 3.5–5.1)
Sodium: 136 mEq/L (ref 135–145)
Total Bilirubin: 0.6 mg/dL (ref 0.2–1.2)
Total Protein: 7.1 g/dL (ref 6.0–8.3)

## 2017-02-13 MED ORDER — AMPHETAMINE-DEXTROAMPHETAMINE 20 MG PO TABS: 20.0000 mg | ORAL_TABLET | Freq: Two times a day (BID) | ORAL | 0 refills | Status: DC

## 2017-02-15 LAB — HEPATITIS C ANTIBODY
Hepatitis C Ab: NONREACTIVE
SIGNAL TO CUT-OFF: 0.21 (ref <1.00)

## 2017-02-15 LAB — HIV ANTIBODY (ROUTINE TESTING W REFLEX): HIV 1&2 Ab, 4th Generation: NONREACTIVE

## 2017-04-01 ENCOUNTER — Other Ambulatory Visit: Payer: Self-pay | Admitting: Surgical

## 2017-04-01 DIAGNOSIS — G4700 Insomnia, unspecified: Secondary | ICD-10-CM

## 2017-04-01 DIAGNOSIS — E785 Hyperlipidemia, unspecified: Secondary | ICD-10-CM

## 2017-04-01 DIAGNOSIS — F3181 Bipolar II disorder: Secondary | ICD-10-CM

## 2017-04-01 MED ORDER — ESCITALOPRAM OXALATE 20 MG PO TABS: ORAL_TABLET | ORAL | 2 refills | Status: DC

## 2017-04-01 MED ORDER — AMITRIPTYLINE HCL 10 MG PO TABS: ORAL_TABLET | ORAL | 1 refills | Status: DC

## 2017-04-01 MED ORDER — ARIPIPRAZOLE 5 MG PO TABS: ORAL_TABLET | ORAL | 1 refills | Status: DC

## 2017-04-01 MED ORDER — ATORVASTATIN CALCIUM 20 MG PO TABS: 20.0000 mg | ORAL_TABLET | Freq: Every day | ORAL | 2 refills | Status: DC

## 2017-04-01 NOTE — Progress Notes (Signed)
Pharmacy changed

## 2017-04-05 NOTE — Progress Notes (Signed)
Michael Durham is a 62 y.o. male is here for follow up.  History of Present Illness:   HPI: Patient presents today for medication management. He requested the appointment.   Patient is concerned of the cost of his Cialis.  He was able to find that [facility] provides 30 of the 5 mg tablets for $90.  He requests that his prescription be changed.  No concerns with the medication itself.  The patient was seen just 1 month ago.  I am okay with giving him new prescriptions for his Adderall today.  Controlled substance agreement completed today.  Patient also mentions some issues with urinary hesitancy.  He describes this as a shy bladder.  It seems to be worse at night.  He does have some decreased stream as well.  Patient states that this is been going on for years and that it was happening prior to any of his current medications.  Previous PSA normal.  There are no preventive care reminders to display for this patient. Depression screen [facility] 2/9 04/06/2017 07/10/2016  Decreased Interest 1 0  Down, Depressed, Hopeless 1 1  PHQ - 2 Score 2 1  Altered sleeping 2 0  Tired, decreased energy 1 -  Change in appetite 2 0  Feeling bad or failure about yourself  1 0  Trouble concentrating 1 1  Moving slowly or fidgety/restless 1 0  Suicidal thoughts 0 0  PHQ-9 Score 10 2  Difficult doing work/chores Somewhat difficult -   PMHx, SurgHx, SocialHx, FamHx, Medications, and Allergies were reviewed in the Visit Navigator and updated as appropriate.   Patient Active Problem List   Diagnosis Date Noted  . Vitamin D deficiency 11/12/2016  . Recurrent major depressive episodes, moderate ([place]) 11/12/2016  . HLD (hyperlipidemia)   . ADHD   . ED (erectile dysfunction)   . Bipolar 2 disorder (HCC)    Social History   Tobacco Use  . Smoking status: Former Research scientist (life sciences)  . Smokeless tobacco: Never Used  Substance Use Topics  . Alcohol use: Yes    Comment: occasionally  . Drug use: No   Current  Medications and Allergies:   .  amitriptyline (ELAVIL) 10 MG tablet, TAKE 1 TO 2 TABLETS(10 TO 20 MG) BY MOUTH AT BEDTIME, Disp: 60 tablet, Rfl: 1 .  amphetamine-dextroamphetamine (ADDERALL) 20 MG tablet, Take 1 tablet (20 mg total) by mouth 2 (two) times daily., Disp: 60 tablet, Rfl: 0 .  amphetamine-dextroamphetamine (ADDERALL) 20 MG tablet, Take 1 tablet (20 mg total) by mouth 2 (two) times daily., Disp: 60 tablet, Rfl: 0 .  amphetamine-dextroamphetamine (ADDERALL) 20 MG tablet, Take 1 tablet (20 mg total) by mouth 2 (two) times daily., Disp: 60 tablet, Rfl: 0 .  ARIPiprazole (ABILIFY) 5 MG tablet, TAKE 1 TABLET(5 MG) BY MOUTH AT BEDTIME, Disp: 90 tablet, Rfl: 1 .  atorvastatin (LIPITOR) 20 MG tablet, Take 1 tablet (20 mg total) by mouth daily., Disp: 90 tablet, Rfl: 2 .  Cholecalciferol (VITAMIN D) 2000 units CAPS, Take 2,000 Units by mouth daily., Disp: , Rfl:  .  escitalopram (LEXAPRO) 20 MG tablet, TAKE 1 TABLET(20 MG) BY MOUTH DAILY, Disp: 90 tablet, Rfl: 2 .  tadalafil (CIALIS) 20 MG tablet, Take 20 mg by mouth daily as needed for erectile dysfunction., Disp: , Rfl:    Allergies  Allergen Reactions  . Wellbutrin [Bupropion] Other (See Comments)    Severe headache.   Review of Systems   Pertinent items are noted in the HPI. Otherwise,  ROS is negative.  Vitals:   Vitals:   04/06/17 0909  BP: 122/84  Pulse: 81  Temp: 97.7 F (36.5 C)  TempSrc: Oral  SpO2: 99%  Weight: 204 lb 3.2 oz (92.6 kg)  Height: 5\' 9"  (1.753 m)     Body mass index is 30.16 kg/m. Physical Exam:   Physical Exam  Constitutional: He is oriented to person, place, and time. He appears well-developed and well-nourished. No distress.  HENT:  Head: Normocephalic and atraumatic.  Right Ear: External ear normal.  Left Ear: External ear normal.  Nose: Nose normal.  Mouth/Throat: Oropharynx is clear and moist.  Eyes: Pupils are equal, round, and reactive to light. Conjunctivae and EOM are normal.  Neck:  Normal range of motion. Neck supple.  Cardiovascular: Normal rate, regular rhythm, normal heart sounds and intact distal pulses.  Pulmonary/Chest: Effort normal and breath sounds normal.  Abdominal: Soft. Bowel sounds are normal.  Musculoskeletal: Normal range of motion.  Neurological: He is alert and oriented to person, place, and time.  Skin: Skin is warm and dry.  Psychiatric: He has a normal mood and affect. His behavior is normal. Judgment and thought content normal.  Nursing note and vitals reviewed.   Assessment and Plan:   Michael Durham was seen today for medication refill.  Diagnoses and all orders for this visit:  Erectile dysfunction, unspecified erectile dysfunction type -     tadalafil (CIALIS) 5 MG tablet; 1-3 tabs po daily as needed.  Printed and given to patient.  Attention deficit hyperactivity disorder (ADHD), predominantly inattentive type Comments: Refills today.  Printed and given to patient. Orders: -     amphetamine-dextroamphetamine (ADDERALL) 20 MG tablet; Take 1 tablet (20 mg total) by mouth 2 (two) times daily. -     amphetamine-dextroamphetamine (ADDERALL) 20 MG tablet; Take 1 tablet (20 mg total) by mouth 2 (two) times daily. -     amphetamine-dextroamphetamine (ADDERALL) 20 MG tablet; Take 1 tablet (20 mg total) by mouth 2 (two) times daily.  Bipolar 2 disorder (HCC) -     ARIPiprazole (ABILIFY) 5 MG tablet; TAKE 1 TABLET(5 MG) BY MOUTH AT BEDTIME   . Reviewed expectations re: course of current medical issues. . Discussed self-management of symptoms. . Outlined signs and symptoms indicating need for more acute intervention. . Patient verbalized understanding and all questions were answered. Marland Kitchen Health Maintenance issues including appropriate healthy diet, exercise, and smoking avoidance were discussed with patient. . See orders for this visit as documented in the electronic medical record. . Patient received an After Visit Summary.  Briscoe Deutscher,  DO [place] 04/06/2017  No future appointments.

## 2017-04-06 ENCOUNTER — Encounter: Payer: Self-pay | Admitting: Family Medicine

## 2017-04-06 ENCOUNTER — Encounter: Payer: Self-pay | Admitting: Surgical

## 2017-04-06 ENCOUNTER — Ambulatory Visit (INDEPENDENT_AMBULATORY_CARE_PROVIDER_SITE_OTHER): Payer: 59 | Admitting: Family Medicine

## 2017-04-06 VITALS — BP 122/84 | HR 81 | Temp 97.7°F | Ht 69.0 in | Wt 204.2 lb

## 2017-04-06 DIAGNOSIS — N529 Male erectile dysfunction, unspecified: Secondary | ICD-10-CM | POA: Diagnosis not present

## 2017-04-06 DIAGNOSIS — F3181 Bipolar II disorder: Secondary | ICD-10-CM

## 2017-04-06 DIAGNOSIS — F900 Attention-deficit hyperactivity disorder, predominantly inattentive type: Secondary | ICD-10-CM

## 2017-04-06 MED ORDER — AMPHETAMINE-DEXTROAMPHETAMINE 20 MG PO TABS: 20.0000 mg | ORAL_TABLET | Freq: Two times a day (BID) | ORAL | 0 refills | Status: DC

## 2017-04-06 MED ORDER — TADALAFIL 5 MG PO TABS: ORAL_TABLET | ORAL | 0 refills | Status: DC

## 2017-04-06 MED ORDER — ARIPIPRAZOLE 5 MG PO TABS: ORAL_TABLET | ORAL | 1 refills | Status: DC

## 2017-09-28 ENCOUNTER — Other Ambulatory Visit: Payer: Self-pay | Admitting: Family Medicine

## 2017-09-28 DIAGNOSIS — F3181 Bipolar II disorder: Secondary | ICD-10-CM

## 2017-12-09 ENCOUNTER — Other Ambulatory Visit: Payer: Self-pay | Admitting: Family Medicine

## 2017-12-09 DIAGNOSIS — F3181 Bipolar II disorder: Secondary | ICD-10-CM

## 2017-12-27 ENCOUNTER — Other Ambulatory Visit: Payer: Self-pay | Admitting: Family Medicine

## 2017-12-27 DIAGNOSIS — E785 Hyperlipidemia, unspecified: Secondary | ICD-10-CM

## 2018-02-09 ENCOUNTER — Other Ambulatory Visit: Payer: Self-pay | Admitting: Family Medicine

## 2018-02-09 DIAGNOSIS — N529 Male erectile dysfunction, unspecified: Secondary | ICD-10-CM

## 2018-02-09 DIAGNOSIS — F900 Attention-deficit hyperactivity disorder, predominantly inattentive type: Secondary | ICD-10-CM

## 2018-02-10 NOTE — Telephone Encounter (Signed)
Pt requesting refill has an appointment on March 4th.

## 2018-02-11 MED ORDER — AMPHETAMINE-DEXTROAMPHETAMINE 20 MG PO TABS: 20.0000 mg | ORAL_TABLET | Freq: Two times a day (BID) | ORAL | 0 refills | Status: DC

## 2018-03-10 ENCOUNTER — Encounter: Payer: Self-pay | Admitting: Family Medicine

## 2018-03-10 ENCOUNTER — Other Ambulatory Visit: Payer: Self-pay | Admitting: Family Medicine

## 2018-03-10 ENCOUNTER — Ambulatory Visit (INDEPENDENT_AMBULATORY_CARE_PROVIDER_SITE_OTHER): Payer: 59 | Admitting: Family Medicine

## 2018-03-10 VITALS — BP 122/64 | HR 71 | Temp 97.8°F | Ht 69.0 in | Wt 196.6 lb

## 2018-03-10 DIAGNOSIS — N529 Male erectile dysfunction, unspecified: Secondary | ICD-10-CM | POA: Diagnosis not present

## 2018-03-10 DIAGNOSIS — Z79899 Other long term (current) drug therapy: Secondary | ICD-10-CM

## 2018-03-10 DIAGNOSIS — E785 Hyperlipidemia, unspecified: Secondary | ICD-10-CM | POA: Diagnosis not present

## 2018-03-10 DIAGNOSIS — F900 Attention-deficit hyperactivity disorder, predominantly inattentive type: Secondary | ICD-10-CM | POA: Diagnosis not present

## 2018-03-10 DIAGNOSIS — F3181 Bipolar II disorder: Secondary | ICD-10-CM

## 2018-03-10 DIAGNOSIS — Z0000 Encounter for general adult medical examination without abnormal findings: Secondary | ICD-10-CM

## 2018-03-10 DIAGNOSIS — M79673 Pain in unspecified foot: Secondary | ICD-10-CM

## 2018-03-10 LAB — CBC WITH DIFFERENTIAL/PLATELET
Basophils Absolute: 0.1 10*3/uL (ref 0.0–0.1)
Basophils Relative: 1.5 % (ref 0.0–3.0)
Eosinophils Absolute: 0.1 10*3/uL (ref 0.0–0.7)
Eosinophils Relative: 1.6 % (ref 0.0–5.0)
HCT: 44.2 % (ref 39.0–52.0)
Hemoglobin: 15 g/dL (ref 13.0–17.0)
Lymphocytes Relative: 27.3 % (ref 12.0–46.0)
Lymphs Abs: 1.8 10*3/uL (ref 0.7–4.0)
MCHC: 34 g/dL (ref 30.0–36.0)
MCV: 90.9 fl (ref 78.0–100.0)
Monocytes Absolute: 0.5 10*3/uL (ref 0.1–1.0)
Monocytes Relative: 7.3 % (ref 3.0–12.0)
Neutro Abs: 4.1 10*3/uL (ref 1.4–7.7)
Neutrophils Relative %: 62.3 % (ref 43.0–77.0)
Platelets: 241 10*3/uL (ref 150.0–400.0)
RBC: 4.86 Mil/uL (ref 4.22–5.81)
RDW: 13.3 % (ref 11.5–15.5)
WBC: 6.6 10*3/uL (ref 4.0–10.5)

## 2018-03-10 LAB — COMPREHENSIVE METABOLIC PANEL
ALT: 19 U/L (ref 0–53)
AST: 14 U/L (ref 0–37)
Albumin: 4.7 g/dL (ref 3.5–5.2)
Alkaline Phosphatase: 57 U/L (ref 39–117)
BUN: 16 mg/dL (ref 6–23)
CO2: 30 mEq/L (ref 19–32)
Calcium: 9.6 mg/dL (ref 8.4–10.5)
Chloride: 101 mEq/L (ref 96–112)
Creatinine, Ser: 1.05 mg/dL (ref 0.40–1.50)
GFR: 70.97 mL/min (ref >60.00)
Glucose, Bld: 93 mg/dL (ref 70–99)
Potassium: 4.7 mEq/L (ref 3.5–5.1)
Sodium: 137 mEq/L (ref 135–145)
Total Bilirubin: 0.5 mg/dL (ref 0.2–1.2)
Total Protein: 7.3 g/dL (ref 6.0–8.3)

## 2018-03-10 LAB — LIPID PANEL
Cholesterol: 140 mg/dL (ref 0–200)
HDL: 56.5 mg/dL (ref >39.00)
LDL Cholesterol: 74 mg/dL (ref 0–99)
NonHDL: 83.72
Total CHOL/HDL Ratio: 2
Triglycerides: 50 mg/dL (ref 0.0–149.0)
VLDL: 10 mg/dL (ref 0.0–40.0)

## 2018-03-10 LAB — HEMOGLOBIN A1C: Hgb A1c MFr Bld: 5.7 % (ref 4.6–6.5)

## 2018-03-10 LAB — TSH: TSH: 2.06 u[IU]/mL (ref 0.35–4.50)

## 2018-03-10 MED ORDER — ARIPIPRAZOLE 5 MG PO TABS: ORAL_TABLET | ORAL | 1 refills | Status: DC

## 2018-03-10 MED ORDER — ATORVASTATIN CALCIUM 20 MG PO TABS: 20.0000 mg | ORAL_TABLET | Freq: Every day | ORAL | 0 refills | Status: DC

## 2018-03-10 MED ORDER — AMPHETAMINE-DEXTROAMPHETAMINE 20 MG PO TABS: 20.0000 mg | ORAL_TABLET | Freq: Two times a day (BID) | ORAL | 0 refills | Status: DC

## 2018-03-10 MED ORDER — ESCITALOPRAM OXALATE 20 MG PO TABS: ORAL_TABLET | ORAL | 0 refills | Status: DC

## 2018-03-10 MED ORDER — TADALAFIL 5 MG PO TABS: ORAL_TABLET | ORAL | 0 refills | Status: DC

## 2018-03-10 NOTE — Progress Notes (Signed)
Subjective:    Michael Durham is a 63 y.o. male who presents today for his Complete Annual Exam. Doing well.   Since the last visit has the patient had any:  Appetite changes? No Unintentional weight loss? No Is medication working well ? Yes Does patient take drug holidays? No Difficulties falling to sleep or maintaining sleep? No Any anxiety?  No Any cardiac issues (fainting or paliptations)? No Suicidal thoughts? No Changes in health since last visit? No New medications? No Any illicit substance abuse? No Has the patient taken his medication today? Yes  Is the patient taking medications without problems? Yes. Does the patient complain of muscle aches?  No. Trying to exercise on a regular basis? Yes. Compliant with diet? Yes.  Lab Results  Component Value Date   CHOL 140 03/10/2018   HDL 56.50 03/10/2018   LDLCALC 74 03/10/2018   TRIG 50.0 03/10/2018   CHOLHDL 2 03/10/2018   Lab Results  Component Value Date   ALT 19 03/10/2018   AST 14 03/10/2018   ALKPHOS 57 03/10/2018   BILITOT 0.5 03/10/2018     Depression screen [facility] 2/9 03/10/2018 04/06/2017 07/10/2016  Decreased Interest 1 1 0  Down, Depressed, Hopeless 1 1 1   PHQ - 2 Score 2 2 1   Altered sleeping 1 2 0  Tired, decreased energy 1 1 -  Change in appetite 0 2 0  Feeling bad or failure about yourself  1 1 0  Trouble concentrating 1 1 1   Moving slowly or fidgety/restless 0 1 0  Suicidal thoughts 0 0 0  PHQ-9 Score 6 10 2   Difficult doing work/chores Somewhat difficult Somewhat difficult -    Current Outpatient Medications:  .  amphetamine-dextroamphetamine (ADDERALL) 20 MG tablet, Take 1 tablet (20 mg total) by mouth 2 (two) times daily for 30 days., Disp: 60 tablet, Rfl: 0 .  ARIPiprazole (ABILIFY) 5 MG tablet, TAKE 1 TABLET(5 MG) BY MOUTH AT BEDTIME, Disp: 90 tablet, Rfl: 1 .  atorvastatin (LIPITOR) 20 MG tablet, Take 1 tablet (20 mg total) by mouth daily., Disp: 90 tablet, Rfl: 0 .  Cholecalciferol  (VITAMIN D) 2000 units CAPS, Take 2,000 Units by mouth daily., Disp: , Rfl:  .  escitalopram (LEXAPRO) 20 MG tablet, TAKE 1 TABLET DAILY, Disp: 90 tablet, Rfl: 0 .  tadalafil (CIALIS) 5 MG tablet, 1-3 tabs po daily as needed., Disp: 30 tablet, Rfl: 0  There are no preventive care reminders to display for this patient.  PMHx, SurgHx, SocialHx, Medications, and Allergies were reviewed in the Visit Navigator and updated as appropriate.   Past Medical History:  Diagnosis Date  . ADHD   . Anxiety   . Bipolar 2 disorder ([place])   . Depression   . ED (erectile dysfunction)   . Encephalitis, Age 5   . HLD (hyperlipidemia)   . Vitamin D deficiency      Past Surgical History:  Procedure Laterality Date  . BRAIN SURGERY       Family History  Problem Relation Age of Onset  . Cancer Father   . Diabetes Brother     Social History   Tobacco Use  . Smoking status: Former Research scientist (life sciences)  . Smokeless tobacco: Never Used  Substance Use Topics  . Alcohol use: Yes    Comment: occasionally  . Drug use: No    Review of Systems:   Pertinent items are noted in the HPI. Otherwise, ROS is negative.  Objective:    Vitals:  03/10/18 1028  BP: 122/64  Pulse: 71  Temp: 97.8 F (36.6 C)  SpO2: 99%   Body mass index is 29.03 kg/m.  General  Alert, cooperative, no distress, appears stated age  Head:  Normocephalic, without obvious abnormality, atraumatic  Eyes:  PERRL, conjunctiva/corneas clear, EOM's intact, fundi benign, both eyes       Ears:  Normal TM's and external ear canals, both ears  Nose: Nares normal, septum midline, mucosa normal, no drainage or sinus tenderness  Throat: Lips, mucosa, and tongue normal; teeth and gums normal  Neck: Supple, symmetrical, trachea midline, no adenopathy; thyroid: no enlargement/tenderness/nodules; no carotid bruit or JVD  Back:   Symmetric, no curvature, ROM normal, no CVA tenderness  Lungs:   Clear to auscultation bilaterally, respirations  unlabored  Chest Wall:  No tenderness or deformity  Heart:  Regular rate and rhythm, S1 and S2 normal, no murmur, rub or gallop  Abdomen:   Soft, non-tender, bowel sounds active all four quadrants, no masses, no organomegaly  Extremities: Extremities normal, atraumatic, no cyanosis or edema  Prostate: Not done   Skin: Skin color, texture, turgor normal, no rashes or lesions  Lymph: Cervical, supraclavicular, and axillary nodes normal  Neurologic: CNII-XII grossly intact. Normal strength, sensation and reflexes throughout   EKG: normal EKG, normal sinus rhythm.   AssessmentPlan:   Michael Durham was seen today for annual exam.  Diagnoses and all orders for this visit:  Routine physical examination  Bipolar 2 disorder ([place]) -     escitalopram (LEXAPRO) 20 MG tablet; TAKE 1 TABLET DAILY -     ARIPiprazole (ABILIFY) 5 MG tablet; TAKE 1 TABLET(5 MG) BY MOUTH AT BEDTIME  Hyperlipidemia, unspecified hyperlipidemia type -     Comprehensive metabolic panel -     Lipid panel -     atorvastatin (LIPITOR) 20 MG tablet; Take 1 tablet (20 mg total) by mouth daily. -     Lipid panel  Attention deficit hyperactivity disorder (ADHD), predominantly inattentive type Comments: Refill today. Orders: -     amphetamine-dextroamphetamine (ADDERALL) 20 MG tablet; Take 1 tablet (20 mg total) by mouth 2 (two) times daily for 30 days.  Erectile dysfunction, unspecified erectile dysfunction type -     tadalafil (CIALIS) 5 MG tablet; 1-3 tabs po daily as needed.  Medication management -     CBC with Differential/Platelet -     TSH -     EKG 12-Lead -     CBC with Differential/Platelet -     Comprehensive metabolic panel -     Hemoglobin A1c -     TSH  Pain of foot, unspecified laterality -     Ambulatory referral to Podiatry    Patient Counseling: [x]   Nutrition: Stressed importance of moderation in sodium/caffeine intake, saturated fat and cholesterol, caloric balance, sufficient intake of fresh  fruits, vegetables, and fiber  [x]   Stressed the importance of regular exercise.   []   Substance Abuse: Discussed cessation/primary prevention of tobacco, alcohol, or other drug use; driving or other dangerous activities under the influence; availability of treatment for abuse.   [x]   Injury prevention: Discussed safety belts, safety helmets, smoke detector, smoking near bedding or upholstery.   []   Sexuality: Discussed sexually transmitted diseases, partner selection, use of condoms, avoidance of unintended pregnancy  and contraceptive alternatives.   [x]   Dental health: Discussed importance of regular tooth brushing, flossing, and dental visits.  [x]   Health maintenance and immunizations reviewed. Please refer to Health maintenance section.  Briscoe Deutscher, DO [place]

## 2018-03-16 ENCOUNTER — Ambulatory Visit (INDEPENDENT_AMBULATORY_CARE_PROVIDER_SITE_OTHER): Payer: 59 | Admitting: Podiatry

## 2018-03-16 ENCOUNTER — Ambulatory Visit (INDEPENDENT_AMBULATORY_CARE_PROVIDER_SITE_OTHER): Payer: 59

## 2018-03-16 DIAGNOSIS — M79671 Pain in right foot: Secondary | ICD-10-CM | POA: Diagnosis not present

## 2018-03-16 NOTE — Patient Instructions (Signed)
Ingrown Toenail  An ingrown toenail occurs when the corner or sides of a toenail grow into the surrounding skin. This causes discomfort and pain. The big toe is most commonly affected, but any of the toes can be affected. If an ingrown toenail is not treated, it can become infected.  What are the causes?  This condition may be caused by:   Wearing shoes that are too small or tight.   An injury, such as stubbing your toe or having your toe stepped on.   Improper cutting or care of your toenails.   Having nail or foot abnormalities that were present from birth (congenital abnormalities), such as having a nail that is too big for your toe.  What increases the risk?  The following factors may make you more likely to develop ingrown toenails:   Age. Nails tend to get thicker with age, so ingrown nails are more common among older people.   Cutting your toenails incorrectly, such as cutting them very short or cutting them unevenly.  An ingrown toenail is more likely to get infected if you have:   Diabetes.   Blood flow (circulation) problems.  What are the signs or symptoms?  Symptoms of an ingrown toenail may include:   Pain, soreness, or tenderness.   Redness.   Swelling.   Hardening of the skin that surrounds the toenail.  Signs that an ingrown toenail may be infected include:   Fluid or pus.   Symptoms that get worse instead of better.  How is this diagnosed?  An ingrown toenail may be diagnosed based on your medical history, your symptoms, and a physical exam. If you have fluid or blood coming from your toenail, a sample may be collected to test for the specific type of bacteria that is causing the infection.  How is this treated?  Treatment depends on how severe your ingrown toenail is. You may be able to care for your toenail at home.   If you have an infection, you may be prescribed antibiotic medicines.   If you have fluid or pus draining from your toenail, your health care provider may drain  it.   If you have trouble walking, you may be given crutches to use.   If you have a severe or infected ingrown toenail, you may need a procedure to remove part or all of the nail.  Follow these instructions at home:  Foot care     Do not pick at your toenail or try to remove it yourself.   Soak your foot in warm, soapy water. Do this for 20 minutes, 3 times a day, or as often as told by your health care provider. This helps to keep your toe clean and keep your skin soft.   Wear shoes that fit well and are not too tight. Your health care provider may recommend that you wear open-toed shoes while you heal.   Trim your toenails regularly and carefully. Cut your toenails straight across to prevent injury to the skin at the corners of the toenail. Do not cut your nails in a curved shape.   Keep your feet clean and dry to help prevent infection.  Medicines   Take over-the-counter and prescription medicines only as told by your health care provider.   If you were prescribed an antibiotic, take it as told by your health care provider. Do not stop taking the antibiotic even if you start to feel better.  Activity   Return to your normal activities   as told by your health care provider. Ask your health care provider what activities are safe for you.   Avoid activities that cause pain.  General instructions   If your health care provider told you to use crutches to help you move around, use them as instructed.   Keep all follow-up visits as told by your health care provider. This is important.  Contact a health care provider if:   You have more redness, swelling, pain, or other symptoms that do not improve with treatment.   You have fluid, blood, or pus coming from your toenail.  Get help right away if:   You have a red streak on your skin that starts at your foot and spreads up your leg.   You have a fever.  Summary   An ingrown toenail occurs when the corner or sides of a toenail grow into the surrounding  skin. This causes discomfort and pain. The big toe is most commonly affected, but any of the toes can be affected.   If an ingrown toenail is not treated, it can become infected.   Fluid or pus draining from your toenail is a sign of infection. Your health care provider may need to drain it. You may be given antibiotics to treat the infection.   Trimming your toenails regularly and properly can help you prevent an ingrown toenail.  This information is not intended to replace advice given to you by your health care provider. Make sure you discuss any questions you have with your health care provider.  Document Released: 12/21/1999 Document Revised: 09/10/2016 Document Reviewed: 09/10/2016  [employer]vier Interactive Patient Education  2019 [employer]

## 2018-03-24 ENCOUNTER — Other Ambulatory Visit: Payer: Self-pay | Admitting: Podiatry

## 2018-03-24 DIAGNOSIS — M79671 Pain in right foot: Secondary | ICD-10-CM

## 2018-03-25 NOTE — Progress Notes (Signed)
Subjective:   Patient ID: Michael Durham, male   DOB: 63 y.o.   MRN: [id number]   HPI 63 year old male presents the office today for concerns of a painful bunion on the right side which is been on there for last 10 years.  He states that he previously did drop an object off about 10 to 15 years ago and he thinks this is what started it.  He states that certain shoes to cause irritation and pain.  He also states that he gets an ingrown toenail to the right big toenail as well pointing the lateral aspect which is been a chronic issue for the last couple of years.  He denies any drainage or pus coming from the area.  No swelling or redness.  He has no other concerns.   Review of Systems  All other systems reviewed and are negative.  Past Medical History:  Diagnosis Date  . ADHD   . Anxiety   . Bipolar 2 disorder ([place])   . Depression   . ED (erectile dysfunction)   . Encephalitis, Age 38   . HLD (hyperlipidemia)   . Vitamin D deficiency     Past Surgical History:  Procedure Laterality Date  . BRAIN SURGERY       Current Outpatient Medications:  .  amphetamine-dextroamphetamine (ADDERALL) 20 MG tablet, Take 1 tablet (20 mg total) by mouth 2 (two) times daily for 30 days., Disp: 60 tablet, Rfl: 0 .  ARIPiprazole (ABILIFY) 5 MG tablet, TAKE 1 TABLET(5 MG) BY MOUTH AT BEDTIME, Disp: 90 tablet, Rfl: 1 .  atorvastatin (LIPITOR) 20 MG tablet, Take 1 tablet (20 mg total) by mouth daily., Disp: 90 tablet, Rfl: 0 .  Cholecalciferol (VITAMIN D) 2000 units CAPS, Take 2,000 Units by mouth daily., Disp: , Rfl:  .  escitalopram (LEXAPRO) 20 MG tablet, TAKE 1 TABLET DAILY, Disp: 90 tablet, Rfl: 0 .  tadalafil (CIALIS) 5 MG tablet, 1-3 tabs po daily as needed., Disp: 30 tablet, Rfl: 0  Allergies  Allergen Reactions  . Wellbutrin [Bupropion] Other (See Comments)    Severe headache.         Objective:  Physical Exam  General: AAO x3, NAD  Dermatological: Mild incurvation present to the  lateral aspect the right hallux toenail but there is no significant edema, erythema there is no drainage of pus or any signs of infection today.  No open lesions.  Vascular: Dorsalis Pedis artery and Posterior Tibial artery pedal pulses are 2/4 bilateral with immedate capillary fill time. There is no pain with calf compression, swelling, warmth, erythema.   Neruologic: Grossly intact via light touch bilateral.  Protective threshold with Semmes Wienstein monofilament intact to all pedal sites bilateral.   Musculoskeletal: Significant restriction in range of motion of the right first MPJ there is dorsal spurring present the first metatarsal phalangeal joint.  There is mild erythema formed rubs inside shoes but there is no skin breakdown increased warmth or signs of infection.  Muscular strength 5/5 in all groups tested bilateral.  Gait: Unassisted, Nonantalgic.     Assessment:   Right first MPJ hallux rigidus; ingrown toenail    Plan:  -Treatment options discussed including all alternatives, risks, and complications -Etiology of symptoms were discussed -X-rays were obtained and reviewed with the patient.  Significant arthritic changes are present the first MPJ. -This time we discussed the etiology natural progression of hallux rigidus.  At this time we discussed orthotics as well as wearing a rigid bottom shoe.  Ultimately if needed we can consider steroid injection based on very minimal discomfort today.  Also long-term consider first MPJ arthrodesis if needed. -Regarding ingrown toenail we discussed ingrown toenails we discussed a partial nail avulsion but he wants to hold off on this.  Discussed nail trimming techniques.  Monitoring signs or symptoms of infection.  Trula Slade DPM

## 2018-03-28 ENCOUNTER — Other Ambulatory Visit: Payer: Self-pay | Admitting: Family Medicine

## 2018-03-28 DIAGNOSIS — E785 Hyperlipidemia, unspecified: Secondary | ICD-10-CM

## 2018-04-05 ENCOUNTER — Encounter: Payer: Self-pay | Admitting: Family Medicine

## 2018-04-12 ENCOUNTER — Encounter: Payer: Self-pay | Admitting: Family Medicine

## 2018-05-24 ENCOUNTER — Encounter: Payer: Self-pay | Admitting: Family Medicine

## 2018-05-24 ENCOUNTER — Other Ambulatory Visit: Payer: Self-pay

## 2018-05-24 DIAGNOSIS — F900 Attention-deficit hyperactivity disorder, predominantly inattentive type: Secondary | ICD-10-CM

## 2018-05-24 MED ORDER — AMPHETAMINE-DEXTROAMPHETAMINE 20 MG PO TABS: 20.0000 mg | ORAL_TABLET | Freq: Two times a day (BID) | ORAL | 0 refills | Status: DC

## 2018-05-24 NOTE — Telephone Encounter (Signed)
One refill ready for you to send he will need f/u after this one have noted on refill.

## 2018-05-27 ENCOUNTER — Encounter: Payer: Self-pay | Admitting: Family Medicine

## 2018-05-28 NOTE — Telephone Encounter (Signed)
Called pt and advised that refill was sent to [facility] on 05/24/18. Scheduled virtual med f/u for June 17th.

## 2018-06-01 ENCOUNTER — Other Ambulatory Visit: Payer: Self-pay | Admitting: Family Medicine

## 2018-06-01 NOTE — Telephone Encounter (Signed)
See request °

## 2018-06-01 NOTE — Telephone Encounter (Signed)
Copied from [place] [id number]. Topic: Quick Communication - Rx Refill/Question >> Jun 01, 2018  1:17 PM Celene Kras A wrote: Medication: amphetamine-dextroamphetamine (ADDERALL) 20 MG tablet  Has the patient contacted their pharmacy? Yes.  Medication was sent to the wrong pharmacy. He prefers the medication go to [facility]. Pt states he is out of medication today. Please advise. (Agent: If no, request that the patient contact the pharmacy for the refill.) (Agent: If yes, when and what did the pharmacy advise?)  Preferred Pharmacy (with phone number or street name): [place], [place] [address] Phone: [phone] Fax: [phone] Not a 24 hour pharmacy; exact hours not known.    Agent: Please be advised that RX refills may take up to 3 business days. We ask that you follow-up with your pharmacy.

## 2018-06-02 ENCOUNTER — Other Ambulatory Visit: Payer: Self-pay

## 2018-06-02 ENCOUNTER — Other Ambulatory Visit: Payer: Self-pay | Admitting: Family Medicine

## 2018-06-02 DIAGNOSIS — F900 Attention-deficit hyperactivity disorder, predominantly inattentive type: Secondary | ICD-10-CM

## 2018-06-02 MED ORDER — AMPHETAMINE-DEXTROAMPHETAMINE 20 MG PO TABS: 20.0000 mg | ORAL_TABLET | Freq: Two times a day (BID) | ORAL | 0 refills | Status: DC

## 2018-06-02 NOTE — Telephone Encounter (Signed)
Pt has an appt already on 6/17. Does pt need to come in sooner?

## 2018-06-02 NOTE — Telephone Encounter (Signed)
Last OV 03/10/18 Last refill 05/24/2018 #60/0 to [facility] pt and left VM to call the pharmacy. - Is he able to pick rx up from [facility] or do we need to change it to [facility]?

## 2018-06-02 NOTE — Telephone Encounter (Signed)
Please call patient we will not send refill until app made.

## 2018-06-02 NOTE — Addendum Note (Signed)
Addended by: Francella Solian on: 06/02/2018 03:55 PM   Modules accepted: Orders

## 2018-06-02 NOTE — Telephone Encounter (Signed)
See note  Copied from [place] [id number]. Topic: Quick Communication - Rx Refill/Question >> Jun 01, 2018  1:17 PM Celene Kras A wrote: Medication: amphetamine-dextroamphetamine (ADDERALL) 20 MG tablet  Has the patient contacted their pharmacy? Yes.  Medication was sent to the wrong pharmacy. He prefers the medication go to [facility]. Pt states he is out of medication today. Please advise. (Agent: If no, request that the patient contact the pharmacy for the refill.) (Agent: If yes, when and what did the pharmacy advise?)  Preferred Pharmacy (with phone number or street name): [place], [place] [address] Phone: [phone] Fax: [phone] Not a 24 hour pharmacy; exact hours not known.    Agent: Please be advised that RX refills may take up to 3 business days. We ask that you follow-up with your pharmacy. >> Jun 02, 2018 10:00 AM Jodie Echevaria wrote: Patient called back and request that Rx is sent to the [facility] please

## 2018-06-22 NOTE — Progress Notes (Signed)
Virtual Visit via Video   Due to the COVID-19 pandemic, this visit was completed with telemedicine (audio/video) technology to reduce patient and provider exposure as well as to preserve personal protective equipment.   I connected with Michael Durham by a video enabled telemedicine application and verified that I am speaking with the correct person using two identifiers. Location patient: Home Location provider: [facility], Office Persons participating in the virtual visit: Melchizedek Espinola, Briscoe Deutscher, DO Lonell Grandchild, CMA acting as scribe for Dr. Briscoe Deutscher.   I discussed the limitations of evaluation and management by telemedicine and the availability of in person appointments. The patient expressed understanding and agreed to proceed.  Care Team   Patient Care Team: Briscoe Deutscher, DO as PCP - General (Family Medicine)  Subjective:   HPI:  ADHD: Patient is currently taking Adderall 20mg  twice a day.  Since the last visit has the patient had any:  Appetite changes? No Unintentional weight loss? No Is medication working well ? Yes Does patient take drug holidays? No Difficulties falling to sleep or maintaining sleep? No Any anxiety?  No Any cardiac issues (fainting or paliptations)? No Suicidal thoughts? No Changes in health since last visit? No New medications? No Any illicit substance abuse? No Has the patient taken his medication today? Yes  Review of Systems  Constitutional: Negative for chills and fever.  HENT: Negative for hearing loss and tinnitus.   Eyes: Negative for blurred vision and double vision.  Respiratory: Negative for cough, shortness of breath and wheezing.   Cardiovascular: Negative for chest pain, palpitations and leg swelling.  Gastrointestinal: Negative for nausea and vomiting.  Genitourinary: Negative for dysuria, frequency and urgency.  Musculoskeletal: Negative for myalgias and neck pain.  Neurological: Negative for  dizziness and headaches.  Psychiatric/Behavioral: Negative for depression and suicidal ideas.    Patient Active Problem List   Diagnosis Date Noted  . Vitamin D deficiency 11/12/2016  . Recurrent major depressive episodes, moderate ([place]) 11/12/2016  . HLD (hyperlipidemia)   . ADHD   . ED (erectile dysfunction)   . Bipolar 2 disorder ([facility])     Social History   Tobacco Use  . Smoking status: Former Research scientist (life sciences)  . Smokeless tobacco: Never Used  Substance Use Topics  . Alcohol use: Yes    Comment: occasionally   Current Outpatient Medications:  .  amphetamine-dextroamphetamine (ADDERALL) 20 MG tablet, Take 1 tablet (20 mg total) by mouth 2 (two) times daily for 30 days., Disp: 60 tablet, Rfl: 0 .  ARIPiprazole (ABILIFY) 5 MG tablet, TAKE 1 TABLET(5 MG) BY MOUTH AT BEDTIME, Disp: 90 tablet, Rfl: 1 .  atorvastatin (LIPITOR) 20 MG tablet, TAKE 1 TABLET DAILY, Disp: 90 tablet, Rfl: 3 .  Cholecalciferol (VITAMIN D) 2000 units CAPS, Take 2,000 Units by mouth daily., Disp: , Rfl:  .  escitalopram (LEXAPRO) 20 MG tablet, TAKE 1 TABLET DAILY, Disp: 90 tablet, Rfl: 0 .  tadalafil (CIALIS) 5 MG tablet, 1-3 tabs po daily as needed., Disp: 30 tablet, Rfl: 0 .  amphetamine-dextroamphetamine (ADDERALL) 20 MG tablet, Take 1 tablet (20 mg total) by mouth daily., Disp: 60 tablet, Rfl: 00 .  latanoprost (XALATAN) 0.005 % ophthalmic solution, INSTILL 1 DROP INTO EACH EYE AT BEDTIME, Disp: , Rfl:   Allergies  Allergen Reactions  . Wellbutrin [Bupropion] Other (See Comments)    Severe headache.   Objective:   VITALS: Per patient if applicable, see vitals. GENERAL: Alert, appears well and in no acute distress. HEENT:  Atraumatic, conjunctiva clear, no obvious abnormalities on inspection of external nose and ears. NECK: Normal movements of the head and neck. CARDIOPULMONARY: No increased WOB. Speaking in clear sentences. I:E ratio WNL.  MS: Moves all visible extremities without noticeable  abnormality. PSYCH: Pleasant and cooperative, well-groomed. Speech normal rate and rhythm. Affect is appropriate. Insight and judgement are appropriate. Attention is focused, linear, and appropriate.  NEURO: CN grossly intact. Oriented as arrived to appointment on time with no prompting. Moves both UE equally.  SKIN: No obvious lesions, wounds, erythema, or cyanosis noted on face or hands.  Depression screen [facility] 2/9 06/23/2018 03/10/2018 04/06/2017  Decreased Interest 0 1 1  Down, Depressed, Hopeless 0 1 1  PHQ - 2 Score 0 2 2  Altered sleeping 0 1 2  Tired, decreased energy 0 1 1  Change in appetite 0 0 2  Feeling bad or failure about yourself  0 1 1  Trouble concentrating 0 1 1  Moving slowly or fidgety/restless 0 0 1  Suicidal thoughts 0 0 0  PHQ-9 Score 0 6 10  Difficult doing work/chores Not difficult at all Somewhat difficult Somewhat difficult   Assessment and Plan:   [name] was seen today for follow-up.  Diagnoses and all orders for this visit:  Attention deficit hyperactivity disorder (ADHD), combined type -     amphetamine-dextroamphetamine (ADDERALL) 20 MG tablet; Take 1 tablet (20 mg total) by mouth daily. -     amphetamine-dextroamphetamine (ADDERALL) 20 MG tablet; Take 1 tablet (20 mg total) by mouth 2 (two) times daily. -     amphetamine-dextroamphetamine (ADDERALL) 20 MG tablet; Take 1 tablet (20 mg total) by mouth 2 (two) times daily.  Encounter for screening colonoscopy -     Ambulatory referral to Gastroenterology   . COVID-19 Education: The signs and symptoms of COVID-19 were discussed with the patient and how to seek care for testing if needed. The importance of social distancing was discussed today. . Reviewed expectations re: course of current medical issues. . Discussed self-management of symptoms. . Outlined signs and symptoms indicating need for more acute intervention. . Patient verbalized understanding and all questions were answered. Marland Kitchen Health Maintenance  issues including appropriate healthy diet, exercise, and smoking avoidance were discussed with patient. . See orders for this visit as documented in the electronic medical record.  Briscoe Deutscher, DO  Records requested if needed. Time spent: 25 minutes, of which >50% was spent in obtaining information about his symptoms, reviewing his previous labs, evaluations, and treatments, counseling him about his condition (please see the discussed topics above), and developing a plan to further investigate it; he had a number of questions which I addressed.

## 2018-06-23 ENCOUNTER — Ambulatory Visit (INDEPENDENT_AMBULATORY_CARE_PROVIDER_SITE_OTHER): Payer: 59 | Admitting: Family Medicine

## 2018-06-23 ENCOUNTER — Other Ambulatory Visit: Payer: Self-pay

## 2018-06-23 ENCOUNTER — Encounter: Payer: Self-pay | Admitting: Family Medicine

## 2018-06-23 VITALS — Ht 69.0 in | Wt 200.0 lb

## 2018-06-23 DIAGNOSIS — F902 Attention-deficit hyperactivity disorder, combined type: Secondary | ICD-10-CM | POA: Diagnosis not present

## 2018-06-23 DIAGNOSIS — Z1211 Encounter for screening for malignant neoplasm of colon: Secondary | ICD-10-CM | POA: Diagnosis not present

## 2018-06-23 MED ORDER — AMPHETAMINE-DEXTROAMPHETAMINE 20 MG PO TABS: 20.0000 mg | ORAL_TABLET | Freq: Two times a day (BID) | ORAL | 0 refills | Status: DC

## 2018-06-23 MED ORDER — AMPHETAMINE-DEXTROAMPHETAMINE 20 MG PO TABS: 20.0000 mg | ORAL_TABLET | Freq: Every day | ORAL | 0 refills | Status: DC

## 2018-07-13 ENCOUNTER — Other Ambulatory Visit: Payer: Self-pay | Admitting: Family Medicine

## 2018-07-13 ENCOUNTER — Encounter: Payer: Self-pay | Admitting: Family Medicine

## 2018-07-13 ENCOUNTER — Other Ambulatory Visit: Payer: Self-pay

## 2018-07-13 ENCOUNTER — Ambulatory Visit: Payer: 59 | Admitting: Family Medicine

## 2018-07-13 DIAGNOSIS — F3181 Bipolar II disorder: Secondary | ICD-10-CM

## 2018-07-13 MED ORDER — ESCITALOPRAM OXALATE 20 MG PO TABS: ORAL_TABLET | ORAL | 0 refills | Status: DC

## 2018-07-23 ENCOUNTER — Other Ambulatory Visit: Payer: Self-pay

## 2018-07-23 ENCOUNTER — Encounter: Payer: Self-pay | Admitting: Family Medicine

## 2018-07-23 ENCOUNTER — Other Ambulatory Visit: Payer: Self-pay | Admitting: Family Medicine

## 2018-07-23 DIAGNOSIS — F902 Attention-deficit hyperactivity disorder, combined type: Secondary | ICD-10-CM

## 2018-07-23 DIAGNOSIS — F900 Attention-deficit hyperactivity disorder, predominantly inattentive type: Secondary | ICD-10-CM

## 2018-07-23 DIAGNOSIS — F3181 Bipolar II disorder: Secondary | ICD-10-CM

## 2018-07-23 NOTE — Progress Notes (Unsigned)
Patient needing rx before next date due for the rx.

## 2018-08-03 ENCOUNTER — Ambulatory Visit (INDEPENDENT_AMBULATORY_CARE_PROVIDER_SITE_OTHER): Payer: Medicare Other | Admitting: Family Medicine

## 2018-08-03 ENCOUNTER — Encounter: Payer: Self-pay | Admitting: Family Medicine

## 2018-08-03 VITALS — BP 120/78 | HR 78 | Temp 98.2°F | Ht 69.0 in | Wt 205.0 lb

## 2018-08-03 DIAGNOSIS — Z79899 Other long term (current) drug therapy: Secondary | ICD-10-CM

## 2018-08-03 DIAGNOSIS — F3181 Bipolar II disorder: Secondary | ICD-10-CM

## 2018-08-03 DIAGNOSIS — F902 Attention-deficit hyperactivity disorder, combined type: Secondary | ICD-10-CM | POA: Diagnosis not present

## 2018-08-03 DIAGNOSIS — Z860100 Personal history of colon polyps, unspecified: Secondary | ICD-10-CM

## 2018-08-03 DIAGNOSIS — F331 Major depressive disorder, recurrent, moderate: Secondary | ICD-10-CM | POA: Diagnosis not present

## 2018-08-03 DIAGNOSIS — H9193 Unspecified hearing loss, bilateral: Secondary | ICD-10-CM

## 2018-08-03 DIAGNOSIS — E782 Mixed hyperlipidemia: Secondary | ICD-10-CM

## 2018-08-03 DIAGNOSIS — Z0000 Encounter for general adult medical examination without abnormal findings: Secondary | ICD-10-CM

## 2018-08-03 DIAGNOSIS — N529 Male erectile dysfunction, unspecified: Secondary | ICD-10-CM

## 2018-08-03 DIAGNOSIS — K5790 Diverticulosis of intestine, part unspecified, without perforation or abscess without bleeding: Secondary | ICD-10-CM

## 2018-08-03 DIAGNOSIS — Z23 Encounter for immunization: Secondary | ICD-10-CM

## 2018-08-03 HISTORY — DX: Personal history of colonic polyps: Z86.010

## 2018-08-03 HISTORY — DX: Diverticulosis of intestine, part unspecified, without perforation or abscess without bleeding: K57.90

## 2018-08-03 HISTORY — DX: Personal history of colon polyps, unspecified: Z86.0100

## 2018-08-03 LAB — CBC WITH DIFFERENTIAL/PLATELET
Basophils Absolute: 0.1 10*3/uL (ref 0.0–0.1)
Basophils Relative: 1.5 % (ref 0.0–3.0)
Eosinophils Absolute: 0.1 10*3/uL (ref 0.0–0.7)
Eosinophils Relative: 1.8 % (ref 0.0–5.0)
HCT: 43.7 % (ref 39.0–52.0)
Hemoglobin: 14.5 g/dL (ref 13.0–17.0)
Lymphocytes Relative: 27.9 % (ref 12.0–46.0)
Lymphs Abs: 1.9 10*3/uL (ref 0.7–4.0)
MCHC: 33.3 g/dL (ref 30.0–36.0)
MCV: 90.6 fl (ref 78.0–100.0)
Monocytes Absolute: 0.4 10*3/uL (ref 0.1–1.0)
Monocytes Relative: 6.4 % (ref 3.0–12.0)
Neutro Abs: 4.1 10*3/uL (ref 1.4–7.7)
Neutrophils Relative %: 62.4 % (ref 43.0–77.0)
Platelets: 208 10*3/uL (ref 150.0–400.0)
RBC: 4.82 Mil/uL (ref 4.22–5.81)
RDW: 13.5 % (ref 11.5–15.5)
WBC: 6.6 10*3/uL (ref 4.0–10.5)

## 2018-08-03 LAB — COMPREHENSIVE METABOLIC PANEL
ALT: 32 U/L (ref 0–53)
AST: 21 U/L (ref 0–37)
Albumin: 4.5 g/dL (ref 3.5–5.2)
Alkaline Phosphatase: 59 U/L (ref 39–117)
BUN: 12 mg/dL (ref 6–23)
CO2: 31 mEq/L (ref 19–32)
Calcium: 9.6 mg/dL (ref 8.4–10.5)
Chloride: 101 mEq/L (ref 96–112)
Creatinine, Ser: 1.16 mg/dL (ref 0.40–1.50)
GFR: 63.18 mL/min (ref >60.00)
Glucose, Bld: 98 mg/dL (ref 70–99)
Potassium: 4.8 mEq/L (ref 3.5–5.1)
Sodium: 136 mEq/L (ref 135–145)
Total Bilirubin: 0.6 mg/dL (ref 0.2–1.2)
Total Protein: 7.1 g/dL (ref 6.0–8.3)

## 2018-08-03 LAB — LIPID PANEL
Cholesterol: 121 mg/dL (ref 0–200)
HDL: 50.7 mg/dL (ref >39.00)
LDL Cholesterol: 57 mg/dL (ref 0–99)
NonHDL: 70.39
Total CHOL/HDL Ratio: 2
Triglycerides: 66 mg/dL (ref 0.0–149.0)
VLDL: 13.2 mg/dL (ref 0.0–40.0)

## 2018-08-03 LAB — HEMOGLOBIN A1C: Hgb A1c MFr Bld: 5.9 % (ref 4.6–6.5)

## 2018-08-03 NOTE — Progress Notes (Signed)
Subjective:   The patient is here for annual Medicare wellness examination and management of other chronic and acute problems.  . The risk factors are reflected in the social history. . The roster of all physicians providing medical care to patient - is listed in the Snapshot section of the chart. . Activities of daily living:  The patient is 100% independent in all ADLs: dressing, toileting, feeding as well as independent mobility. . Home safety  The patient has smoke detectors in the home. They wear seatbelts.  There are no firearms at home. There is no violence in the home.  . There is no risks for hepatitis, STDs, or HIV. There is no history of blood transfusion. They have no travel history to infectious disease endemic areas of the world. . The patient has seen a dentist in the last six months. They have seen their eye doctor in the last year. They admit to slight hearing difficulty with regard to whispered voices and some television programs.  They have deferred audiologic testing in the last year.  They do not  have excessive sun exposure. Discussed the need for sun protection: hats, long sleeves and use of sunscreen if there is significant sun exposure.  . The importance of a healthy diet is discussed. They do have a healthy diet. . The benefits of regular aerobic exercise were discussed.  . There are no signs or vegative symptoms of depression, including irritability, change in appetite, anhedonia, sadness/tearfullness. . Cognitive assessment completed and without concerns. . The following portions of the patient's history were reviewed and updated as appropriate: allergies, current medications, past family history, past medical history, past surgical history, past social history  and problem list.  Patient Care Team: Briscoe Deutscher, DO as PCP - General (Family Medicine)  Past medical, surgical, social and family history reviewed and updated:  Patient Active Problem List    Diagnosis Date Noted  . Diverticulosis 08/03/2018  . Hx of colonic polyp - last colonoscopy with Dr. Alinda Sierras on 12/21/13 08/03/2018  . Vitamin D deficiency 11/12/2016  . Recurrent major depressive episodes, moderate ([place]) 11/12/2016  . HLD (hyperlipidemia)   . ADHD   . ED (erectile dysfunction)   . Bipolar 2 disorder [facility])    Past Surgical History:  Procedure Laterality Date  . BRAIN SURGERY     Social History   Tobacco Use  . Smoking status: Former Research scientist (life sciences)  . Smokeless tobacco: Never Used  . Tobacco comment: Quit > 15 years ago, smoked 1-2 ppd for 30 years  Substance Use Topics  . Alcohol use: Yes    Comment: occasionally   Social History   Socioeconomic History  . Marital status: Married    Spouse name: Not on file  . Number of children: Not on file  . Years of education: Not on file  . Highest education level: Not on file  Occupational History  . Not on file  Social Needs  . Financial resource strain: Not on file  . Food insecurity    Worry: Never true    Inability: Never true  . Transportation needs    Medical: No    Non-medical: No  Tobacco Use  . Smoking status: Former Research scientist (life sciences)  . Smokeless tobacco: Never Used  . Tobacco comment: Quit > 15 years ago, smoked 1-2 ppd for 30 years  Substance and Sexual Activity  . Alcohol use: Yes    Comment: occasionally  . Drug use: No  . Sexual activity: Yes  Partners: Female    Birth control/protection: None  Lifestyle  . Physical activity    Days per week: 0 days    Minutes per session: 0 min  . Stress: To some extent  Relationships  . Social Herbalist on phone: Not on file    Gets together: Not on file    Attends religious service: Not on file    Active member of club or organization: Not on file    Attends meetings of clubs or organizations: Not on file    Relationship status: Married  . Intimate partner violence    Fear of current or ex partner: No    Emotionally abused: No    Physically abused:  No    Forced sexual activity: No  Other Topics Concern  . Not on file  Social History Narrative  . Not on file   Current medication list and allergy/intolerance information reviewed and updated:   Current Outpatient Medications:  .  amphetamine-dextroamphetamine (ADDERALL) 20 MG tablet, Take 1 tablet (20 mg total) by mouth daily., Disp: 60 tablet, Rfl: 0 .  ARIPiprazole (ABILIFY) 5 MG tablet, TAKE 1 TABLET(5 MG) BY MOUTH AT BEDTIME, Disp: 90 tablet, Rfl: 1 .  atorvastatin (LIPITOR) 20 MG tablet, TAKE 1 TABLET DAILY (NEED FOLLOW UP FOR FURTHER REFILLS), Disp: 90 tablet, Rfl: 3 .  Cholecalciferol (VITAMIN D) 2000 units CAPS, Take 2,000 Units by mouth daily., Disp: , Rfl:  .  escitalopram (LEXAPRO) 20 MG tablet, TAKE 1 TABLET DAILY, Disp: 90 tablet, Rfl: 0 .  latanoprost (XALATAN) 0.005 % ophthalmic solution, INSTILL 1 DROP INTO EACH EYE AT BEDTIME, Disp: , Rfl:  .  tadalafil (CIALIS) 5 MG tablet, 1-3 tabs po daily as needed., Disp: 30 tablet, Rfl: 0  Allergies  Allergen Reactions  . Wellbutrin [Bupropion] Other (See Comments)    Severe headache.   Review of Systems: No headache, visual changes, nausea, vomiting, diarrhea, constipation, dizziness, abdominal pain, skin rash, fevers, chills, night sweats, weight loss, swollen lymph nodes, body aches, joint swelling, muscle aches, chest pain, shortness of breath, mood changes, visual or auditory hallucinations.   Health Risk Assessment:   CLINICAL INTAKE, INCLUDING ADLS, SENSORY DIFFICULTY Pre-visit preparation completed: Yes  Pain : No/denies pain  Nutritional Status: BMI > 30  Obese  Activities of Daily Living: Independent Ambulation: Independent Medication Administration: Independent Home Management: Independent  Do you feel unsafe in your current relationship?: No Do you feel physically threatened by others?: No Anyone hurting you at home, work, or school?: No  How often do you need to have someone help you when you read  instructions, pamphlets, or other written materials from your doctor or pharmacy?: 1 - Never  Interpreter Needed?: No  GOALS Goals    . DIET - INCREASE WATER INTAKE    . Increase physical activity      FUNCTIONAL STATUS SURVEY, EXERCISE, CARDIAC RISK FACTORS Is the patient deaf or have difficulty hearing?: Yes(referral to autiology) Does the patient have difficulty seeing, even when wearing glasses/contacts?: No Does the patient have difficulty concentrating, remembering, or making decisions?: No Does the patient have difficulty walking or climbing stairs?: No Does the patient have difficulty dressing or bathing?: No Does the patient have difficulty doing errands alone such as visiting a doctor's office or shopping?: No  DEPRESSION QUESTIONNAIRE Depression screen [facility] 2/9 08/03/2018 06/23/2018 03/10/2018  Decreased Interest 1 0 1  Down, Depressed, Hopeless 0 0 1  PHQ - 2 Score 1 0 2  Altered sleeping 1 0 1  Tired, decreased energy 2 0 1  Change in appetite 0 0 0  Feeling bad or failure about yourself  0 0 1  Trouble concentrating 1 0 1  Moving slowly or fidgety/restless 0 0 0  Suicidal thoughts 0 0 0  PHQ-9 Score 5 0 6  Difficult doing work/chores Not difficult at all Not difficult at all Somewhat difficult   FALL RISK Fall Risk  08/03/2018 06/23/2018 03/10/2018 07/10/2016  Falls in the past year? 0 0 0 No  Number falls in past yr: 0 0 0 -  Injury with Fall? 0 0 0 -   COGNITIVE FUNCTION Mini-Cog - 08/03/18 1211    Normal clock drawing test?  yes    How many words correct?  3      Objective:   Vitals:   08/03/18 1040  BP: 120/78  Pulse: 78  Temp: 98.2 F (36.8 C)  SpO2: 97%   Body mass index is 30.27 kg/m.  Physical Exam  Constitutional: He is oriented to person, place, and time. He appears well-developed and well-nourished. No distress.  HENT:  Head: Normocephalic and atraumatic.  Eyes: Conjunctivae are normal.  Neck: Normal range of motion. No thyromegaly present.   Cardiovascular: Normal rate, regular rhythm and intact distal pulses. Exam reveals no gallop and no friction rub.  No murmur heard. Pulmonary/Chest: Effort normal and breath sounds normal. No respiratory distress. He has no wheezes. He exhibits no tenderness.  Abdominal: Soft. Bowel sounds are normal. There is no abdominal tenderness.  Musculoskeletal: Normal range of motion.        General: No deformity or edema.  Neurological: He is alert and oriented to person, place, and time.  Skin: Skin is warm and dry. He is not diaphoretic.  Psychiatric: He has a normal mood and affect.   EKG: normal sinus rhythm (reviewed March EKG).  Timed Get Up and Go performed: yes, taking < 12 seconds  Assessment and Plan:   Lc was seen today for medicare wellness.  Diagnoses and all orders for this visit:  Medicare welcome exam  Recurrent major depressive episodes, moderate (HCC)  Mixed hyperlipidemia -     Comprehensive metabolic panel -     Lipid panel  Attention deficit hyperactivity disorder (ADHD), combined type  Erectile dysfunction, unspecified erectile dysfunction type  Bipolar 2 disorder (HCC)  Decreased hearing of both ears -     Ambulatory referral to Audiology  Medication management -     CBC with Differential/Platelet -     Hemoglobin A1c  Need for pneumococcal vaccine -     Pneumococcal polysaccharide vaccine 23-valent greater than or equal to 2yo subcutaneous/IM  Diverticulosis  Hx of colonic polyp - last colonoscopy with Dr. Alinda Sierras on 12/21/13   Immunization History  Administered Date(s) Administered  . Influenza Inj Mdck Quad Pf 12/07/2015  . Influenza Whole 10/20/2017  . Influenza, Seasonal, Injecte, Preservative Fre 10/14/2012  . Influenza,inj,Quad PF,6+ Mos 10/20/2013, 10/26/2014, 11/12/2016  . Influenza,inj,quad, With Preservative 11/19/2017  . Influenza-Unspecified 12/07/2015  . Pneumococcal Conjugate-13 10/26/2014  . Pneumococcal Polysaccharide-23  08/03/2018  . Tdap 01/06/2006, 12/07/2015  . Zoster 10/20/2013   Qualifies for Shingles Vaccine? Yes Note: Contraindications include severe allergic reaction (e.g., anaphylaxis) after a previous dose or to a vaccine component, known severe immunodeficiency (e.g., from hematologic and solid tumors, receipt of chemotherapy, congenital immunodeficiency,  long-term immunosuppressive therapy(g) or patients with HIV infection who are severely immunocompromised), and pregnancy.  Screening Tests Health Maintenance  Topic Date Due  . PNA vac Low Risk Adult (2 of 2 - PPSV23) 07/23/2018  . INFLUENZA VACCINE  08/07/2018  . COLONOSCOPY  12/22/2018  . TETANUS/TDAP  12/06/2025  . Hepatitis C Screening  Completed  . HIV Screening  Completed   Cancer Screenings: Lung: Low Dose CT Chest recommended if Age 68-80 years, 30 pack-year currently smoking OR have quit w/in 15years. Patient does not qualify. Colorectal: due in December.  Advanced Directives has an advanced directive - a copy will be provided.  I have personally reviewed and noted the following in the patient's chart:   . Medical and social history . Use of alcohol, tobacco or illicit drugs  . Current medications and supplements . Functional ability and status . Nutritional status . Physical activity . Advanced directives . List of other physicians . Hospitalizations, surgeries, and ER visits in previous 12 months . Vitals . Screenings to include cognitive, depression, and falls . Referrals and appointments  In addition, I have reviewed and discussed with patient certain preventive protocols, quality metrics, and best practice recommendations. A personalized care plan for preventive services as well as general preventive health recommendations were provided to patient.   Briscoe Deutscher, DO

## 2018-08-03 NOTE — Patient Instructions (Signed)
Health Maintenance  Topic Date Due  . PNA vac Low Risk Adult (2 of 2 - PPSV23) 07/23/2018  . INFLUENZA VACCINE  08/07/2018  . COLONOSCOPY  12/22/2018  . TETANUS/TDAP  12/06/2025  . Hepatitis C Screening  Completed  . HIV Screening  Completed   Dr .Alinda Sierras was your GI doctor. I updated the records.

## 2018-08-05 ENCOUNTER — Encounter: Payer: Self-pay | Admitting: Family Medicine

## 2018-08-05 DIAGNOSIS — N529 Male erectile dysfunction, unspecified: Secondary | ICD-10-CM

## 2018-08-05 MED ORDER — TADALAFIL 5 MG PO TABS: ORAL_TABLET | ORAL | 0 refills | Status: DC

## 2018-08-05 NOTE — Telephone Encounter (Signed)
Pulled up and ready for you send

## 2018-08-06 MED ORDER — TADALAFIL 5 MG PO TABS: ORAL_TABLET | ORAL | 0 refills | Status: DC

## 2018-08-06 NOTE — Telephone Encounter (Signed)
Sorry sent to wrong pharmacy

## 2018-09-06 ENCOUNTER — Other Ambulatory Visit: Payer: Self-pay | Admitting: Family Medicine

## 2018-09-06 DIAGNOSIS — F902 Attention-deficit hyperactivity disorder, combined type: Secondary | ICD-10-CM

## 2018-09-07 ENCOUNTER — Encounter: Payer: Self-pay | Admitting: Family Medicine

## 2018-09-07 DIAGNOSIS — F902 Attention-deficit hyperactivity disorder, combined type: Secondary | ICD-10-CM

## 2018-09-08 MED ORDER — AMPHETAMINE-DEXTROAMPHETAMINE 20 MG PO TABS: 20.0000 mg | ORAL_TABLET | Freq: Two times a day (BID) | ORAL | 0 refills | Status: DC

## 2018-09-08 NOTE — Telephone Encounter (Signed)
See note

## 2018-09-08 NOTE — Telephone Encounter (Signed)
Pt called for an update on the Rx refill request. Pt stated the Rx is at the pharmacy but they will not allow him to get it due to the way the Rx was written. Pt requests call back.

## 2018-09-08 NOTE — Telephone Encounter (Signed)
Last fill 06/23/18  #60/0 Last OV 08/03/18 Ok for refill?

## 2018-09-10 MED ORDER — AMPHETAMINE-DEXTROAMPHETAMINE 20 MG PO TABS: 20.0000 mg | ORAL_TABLET | Freq: Two times a day (BID) | ORAL | 0 refills | Status: DC

## 2018-10-09 ENCOUNTER — Other Ambulatory Visit: Payer: Self-pay | Admitting: Family Medicine

## 2018-10-09 ENCOUNTER — Encounter: Payer: Self-pay | Admitting: Family Medicine

## 2018-10-09 ENCOUNTER — Encounter: Payer: Self-pay | Admitting: Podiatry

## 2018-10-09 DIAGNOSIS — F3181 Bipolar II disorder: Secondary | ICD-10-CM

## 2018-10-11 ENCOUNTER — Other Ambulatory Visit: Payer: Self-pay

## 2018-10-11 DIAGNOSIS — F3181 Bipolar II disorder: Secondary | ICD-10-CM

## 2018-10-11 MED ORDER — ESCITALOPRAM OXALATE 20 MG PO TABS: ORAL_TABLET | ORAL | 0 refills | Status: DC

## 2018-10-11 MED ORDER — ARIPIPRAZOLE 5 MG PO TABS: ORAL_TABLET | ORAL | 1 refills | Status: DC

## 2018-10-14 ENCOUNTER — Encounter: Payer: Self-pay | Admitting: Family Medicine

## 2018-10-14 ENCOUNTER — Ambulatory Visit (INDEPENDENT_AMBULATORY_CARE_PROVIDER_SITE_OTHER): Payer: Medicare Other

## 2018-10-14 ENCOUNTER — Other Ambulatory Visit: Payer: Self-pay

## 2018-10-14 DIAGNOSIS — Z23 Encounter for immunization: Secondary | ICD-10-CM

## 2018-10-27 ENCOUNTER — Other Ambulatory Visit: Payer: Self-pay | Admitting: Family Medicine

## 2018-10-27 DIAGNOSIS — N529 Male erectile dysfunction, unspecified: Secondary | ICD-10-CM

## 2018-10-27 MED ORDER — TADALAFIL 5 MG PO TABS: ORAL_TABLET | ORAL | 0 refills | Status: DC

## 2018-11-24 ENCOUNTER — Other Ambulatory Visit: Payer: Self-pay

## 2018-11-25 ENCOUNTER — Encounter: Payer: Self-pay | Admitting: Family Medicine

## 2018-11-25 ENCOUNTER — Ambulatory Visit (INDEPENDENT_AMBULATORY_CARE_PROVIDER_SITE_OTHER): Payer: Medicare Other | Admitting: Family Medicine

## 2018-11-25 VITALS — BP 120/64 | HR 82 | Temp 98.5°F | Wt 201.0 lb

## 2018-11-25 DIAGNOSIS — F902 Attention-deficit hyperactivity disorder, combined type: Secondary | ICD-10-CM

## 2018-11-25 DIAGNOSIS — F331 Major depressive disorder, recurrent, moderate: Secondary | ICD-10-CM

## 2018-11-25 NOTE — Progress Notes (Signed)
Subjective:    Patient ID: Michael Durham, male    DOB: 23-Nov-1955, 63 y.o.   MRN: [id number]  No chief complaint on file.   HPI Patient was seen today for f/u on chronic conditions and TOC, previously seen by Dr. Briscoe Durham.  Pt states he is on lexapro 20 mg but wants to decrease it to 10 mg daily.  Pt notes sleep, appetite, and mood are good.  Pt states he has not interest/motivation.  Pt is now retired.  He may take naps during the day.  Pt is learning to play the guitar with online lessons.  Pt is not currently exercising. Pt is also on Adderall 20 mg BID.  Pt states he had formal ADHD testing about 10 yrs ago.  Past Medical History:  Diagnosis Date  . ADHD   . Anxiety   . Bipolar 2 disorder ([place])   . Depression   . Diverticulosis 08/03/2018  . ED (erectile dysfunction)   . Encephalitis, Age 38   . HLD (hyperlipidemia)   . Hx of colonic polyp - last colonoscopy with Dr. Alinda Durham on 12/21/13 08/03/2018  . Vitamin D deficiency     Allergies  Allergen Reactions  . Wellbutrin [Bupropion] Other (See Comments)    Severe headache.    ROS General: Denies fever, chills, night sweats, changes in weight, changes in appetite HEENT: Denies headaches, ear pain, changes in vision, rhinorrhea, sore throat CV: Denies CP, palpitations, SOB, orthopnea Pulm: Denies SOB, cough, wheezing GI: Denies abdominal pain, nausea, vomiting, diarrhea, constipation GU: Denies dysuria, hematuria, frequency, vaginal discharge Msk: Denies muscle cramps, joint pains Neuro: Denies weakness, numbness, tingling Skin: Denies rashes, bruising Psych: Denies anxiety, hallucinations  +depression, decreased motivation     Objective:    Blood pressure 120/64, pulse 82, temperature 98.5 F (36.9 C), temperature source Temporal, weight 201 lb (91.2 kg), SpO2 98 %.   Gen. Pleasant, well-nourished, flat affect, in no distress HEENT: [place]/AT, face symmetric, conjunctiva clear, no scleral icterus, PERRLA, EOMI, nares  patent without drainage Lungs: no accessory muscle use, CTAB, no wheezes or rales Cardiovascular: RRR, no m/r/g, no peripheral edema Neuro:  A&Ox3, CN II-XII intact, normal gait Skin:  Warm, no lesions/ rash   Wt Readings from Last 3 Encounters:  11/25/18 201 lb (91.2 kg)  08/03/18 205 lb (93 kg)  06/23/18 200 lb (90.7 kg)    Lab Results  Component Value Date   WBC 6.6 08/03/2018   HGB 14.5 08/03/2018   HCT 43.7 08/03/2018   PLT 208.0 08/03/2018   GLUCOSE 98 08/03/2018   CHOL 121 08/03/2018   TRIG 66.0 08/03/2018   HDL 50.70 08/03/2018   LDLCALC 57 08/03/2018   ALT 32 08/03/2018   AST 21 08/03/2018   NA 136 08/03/2018   K 4.8 08/03/2018   CL 101 08/03/2018   CREATININE 1.16 08/03/2018   BUN 12 08/03/2018   CO2 31 08/03/2018   TSH 2.06 03/10/2018   HGBA1C 5.9 08/03/2018    Assessment/Plan:  Recurrent major depressive episodes, moderate (HCC) -Stable -PHQ 9 score 7 -GAD-7 score 1 -Discussed hesitation with decreasing Lexapro 20 mg to 10 mg given patient's lack of motivation however agreed to try.  Patient to start taking half Lexapro 20 mg tab daily -discussed finding activities to get pt up and active. -Given contact information for counseling.  Pt to schedule an appt. -f/u in 4-6 wks  Attention deficit hyperactivity disorder (ADHD), combined type -Stable -Continue Adderall 20 mg twice daily  F/u  4-6 weeks, sooner if needed  Michael Mitts, MD

## 2018-11-25 NOTE — Patient Instructions (Addendum)
Today we discussed decreasing your Lexapro to 10 mg daily.  You can take 1/2 of the pills you currently have.  If you feel like the decrease is not working for you, then you can restart the whole pill.  Living With Depression Everyone experiences occasional disappointment, sadness, and loss in their lives. When you are feeling down, blue, or sad for at least 2 weeks in a row, it may mean that you have depression. Depression can affect your thoughts and feelings, relationships, daily activities, and physical health. It is caused by changes in the way your brain functions. If you receive a diagnosis of depression, your health care provider will tell you which type of depression you have and what treatment options are available to you. If you are living with depression, there are ways to help you recover from it and also ways to prevent it from coming back. How to cope with lifestyle changes Coping with stress     Stress is your body's reaction to life changes and events, both good and bad. Stressful situations may include:  Getting married.  The death of a spouse.  Losing a job.  Retiring.  Having a baby. Stress can last just a few hours or it can be ongoing. Stress can play a major role in depression, so it is important to learn both how to cope with stress and how to think about it differently. Talk with your health care provider or a counselor if you would like to learn more about stress reduction. He or she may suggest some stress reduction techniques, such as:  Music therapy. This can include creating music or listening to music. Choose music that you enjoy and that inspires you.  Mindfulness-based meditation. This kind of meditation can be done while sitting or walking. It involves being aware of your normal breaths, rather than trying to control your breathing.  Centering prayer. This is a kind of meditation that involves focusing on a spiritual word or phrase. Choose a word, phrase,  or sacred image that is meaningful to you and that brings you peace.  Deep breathing. To do this, expand your stomach and inhale slowly through your nose. Hold your breath for 3-5 seconds, then exhale slowly, allowing your stomach muscles to relax.  Muscle relaxation. This involves intentionally tensing muscles then relaxing them. Choose a stress reduction technique that fits your lifestyle and personality. Stress reduction techniques take time and practice to develop. Set aside 5-15 minutes a day to do them. Therapists can offer training in these techniques. The training may be covered by some insurance plans. Other things you can do to manage stress include:  Keeping a stress diary. This can help you learn what triggers your stress and ways to control your response.  Understanding what your limits are and saying no to requests or events that lead to a schedule that is too full.  Thinking about how you respond to certain situations. You may not be able to control everything, but you can control how you react.  Adding humor to your life by watching funny films or TV shows.  Making time for activities that help you relax and not feeling guilty about spending your time this way.  Medicines Your health care provider may suggest certain medicines if he or she feels that they will help improve your condition. Avoid using alcohol and other substances that may prevent your medicines from working properly (may interact). It is also important to:  Talk with your pharmacist or  health care provider about all the medicines that you take, their possible side effects, and what medicines are safe to take together.  Make it your goal to take part in all treatment decisions (shared decision-making). This includes giving input on the side effects of medicines. It is best if shared decision-making with your health care provider is part of your total treatment plan. If your health care provider prescribes a  medicine, you may not notice the full benefits of it for 4-8 weeks. Most people who are treated for depression need to be on medicine for at least 6-12 months after they feel better. If you are taking medicines as part of your treatment, do not stop taking medicines without first talking to your health care provider. You may need to have the medicine slowly decreased (tapered) over time to decrease the risk of harmful side effects. Relationships Your health care provider may suggest family therapy along with individual therapy and drug therapy. While there may not be family problems that are causing you to feel depressed, it is still important to make sure your family learns as much as they can about your mental health. Having your family's support can help make your treatment successful. How to recognize changes in your condition Everyone has a different response to treatment for depression. Recovery from major depression happens when you have not had signs of major depression for two months. This may mean that you will start to:  Have more interest in doing activities.  Feel less hopeless than you did 2 months ago.  Have more energy.  Overeat less often, or have better or improving appetite.  Have better concentration. Your health care provider will work with you to decide the next steps in your recovery. It is also important to recognize when your condition is getting worse. Watch for these signs:  Having fatigue or low energy.  Eating too much or too little.  Sleeping too much or too little.  Feeling restless, agitated, or hopeless.  Having trouble concentrating or making decisions.  Having unexplained physical complaints.  Feeling irritable, angry, or aggressive. Get help as soon as you or your family members notice these symptoms coming back. How to get support and help from others How to talk with friends and family members about your condition  Talking to friends and family  members about your condition can provide you with one way to get support and guidance. Reach out to trusted friends or family members, explain your symptoms to them, and let them know that you are working with a health care provider to treat your depression. Financial resources Not all insurance plans cover mental health care, so it is important to check with your insurance carrier. If paying for co-pays or counseling services is a problem, search for a local or county mental health care center. They may be able to offer public mental health care services at low or no cost when you are not able to see a private health care provider. If you are taking medicine for depression, you may be able to get the generic form, which may be less expensive. Some makers of prescription medicines also offer help to patients who cannot afford the medicines they need. Follow these instructions at home:   Get the right amount and quality of sleep.  Cut down on using caffeine, tobacco, alcohol, and other potentially harmful substances.  Try to exercise, such as walking or lifting small weights.  Take over-the-counter and prescription medicines only as told  by your health care provider.  Eat a healthy diet that includes plenty of vegetables, fruits, whole grains, low-fat dairy products, and lean protein. Do not eat a lot of foods that are high in solid fats, added sugars, or salt.  Keep all follow-up visits as told by your health care provider. This is important. Contact a health care provider if:  You stop taking your antidepressant medicines, and you have any of these symptoms: ? Nausea. ? Headache. ? Feeling lightheaded. ? Chills and body aches. ? Not being able to sleep (insomnia).  You or your friends and family think your depression is getting worse. Get help right away if:  You have thoughts of hurting yourself or others. If you ever feel like you may hurt yourself or others, or have thoughts about  taking your own life, get help right away. You can go to your nearest emergency department or call:  Your local emergency services (911 in the [place]  A suicide crisis helpline, such as the [place] at 671-646-8733. This is open 24-hours a day. Summary  If you are living with depression, there are ways to help you recover from it and also ways to prevent it from coming back.  Work with your health care team to create a management plan that includes counseling, stress management techniques, and healthy lifestyle habits. This information is not intended to replace advice given to you by your health care provider. Make sure you discuss any questions you have with your health care provider. Document Released: 11/26/2015 Document Revised: 04/16/2018 Document Reviewed: 11/26/2015 Elsevier Patient Education  2020 [place] With Attention Deficit Hyperactivity Disorder If you have been diagnosed with attention deficit hyperactivity disorder (ADHD), you may be relieved that you now know why you have felt or behaved a certain way. Still, you may feel overwhelmed about the treatment ahead. You may also wonder how to get the support you need and how to deal with the condition day-to-day. With treatment and support, you can live with ADHD and manage your symptoms. How to manage lifestyle changes Managing stress Stress is your body's reaction to life changes and events, both good and bad. To cope with the stress of an ADHD diagnosis, it may help to:  Learn more about ADHD.  Exercise regularly. Even a short daily walk can lower stress levels.  Participate in training or education programs (including social skills training classes) that teach you to deal with symptoms.  Medicines Your health care provider may suggest certain medicines if he or she feels that they will help to improve your condition. Stimulant medicines are usually prescribed to treat ADHD, and  therapy may also be prescribed. It is important to:  Avoid using alcohol and other substances that may prevent your medicines from working properly [facility]).  Talk with your pharmacist or health care provider about all the medicines that you take, their possible side effects, and what medicines are safe to take together.  Make it your goal to take part in all treatment decisions (shared decision-making). Ask about possible side effects of medicines that your health care provider recommends, and tell him or her how you feel about having those side effects. It is best if shared decision-making with your health care provider is part of your total treatment plan. Relationships To strengthen your relationships with family members while treating your condition, consider taking part in family therapy. You might also attend self-help groups alone or with a loved one. Be honest  about how your symptoms affect your relationships. Make an effort to communicate respectfully instead of fighting, and find ways to show others that you care. Psychotherapy may be useful in helping you cope with how ADHD affects your relationships. How to recognize changes in your condition The following signs may mean that your treatment is working well and your condition is improving:  Consistently being on time for appointments.  Being more organized at home and work.  Other people noticing improvements in your behavior.  Achieving goals that you set for yourself.  Thinking more clearly. The following signs may mean that your treatment is not working very well:  Feeling impatience or more confusion.  Missing, forgetting, or being late for appointments.  An increasing sense of disorganization and messiness.  More difficulty in reaching goals that you set for yourself.  Loved ones becoming angry or frustrated with you. Where to find support Talking to others  Keep emotion out of important discussions and  speak in a calm, logical way.  Listen closely and patiently to your loved ones. Try to understand their point of view, and try to avoid getting defensive.  Take responsibility for the consequences of your actions.  Ask that others do not take your behaviors personally.  Aim to solve problems as they come up, and express your feelings instead of bottling them up.  Talk openly about what you need from your loved ones and how they can support you.  Consider going to family therapy sessions or having your family meet with a specialist who deals with ADHD-related behavior problems. Finances Not all insurance plans cover mental health care, so it is important to check with your insurance carrier. If paying for co-pays or counseling services is a problem, search for a local or county mental health care center. Public mental health care services may be offered there at a low cost or no cost when you are not able to see a private health care provider. If you are taking medicine for ADHD, you may be able to get the generic form, which may be less expensive than brand-name medicine. Some makers of prescription medicines also offer help to patients who cannot afford the medicines that they need. Follow these instructions at home:  Take over-the-counter and prescription medicines only as told by your health care provider. Check with your health care provider before taking any new medicines.  Create structure and an organized atmosphere at home. For example: ? Make a list of tasks, then rank them from most important to least important. Work on one task at a time until your listed tasks are done. ? Make a daily schedule and follow it consistently every day. ? Use an appointment calendar, and check it 2 or 3 times a day to keep on track. Keep it with you when you leave the house. ? Create spaces where you keep certain things, and always put things back in their places after you use them.  Keep all  follow-up visits as told by your health care provider. This is important. Questions to ask your health care provider:  What are the risks and benefits of taking medicines?  Would I benefit from therapy?  How often should I follow up with a health care provider? Contact a health care provider if:  You have side effects from your medicines, such as: ? Repeated muscle twitches, coughing, or speech outbursts. ? Sleep problems. ? Loss of appetite. ? Depression. ? New or worsening behavior problems. ? Dizziness. ?  Unusually fast heartbeat. ? Stomach pains. ? Headaches. Get help right away if:  You have a severe reaction to a medicine.  Your behavior suddenly gets worse. Summary  With treatment and support, you can live with ADHD and manage your symptoms.  The medicines that are most often prescribed for ADHD are stimulants.  Consider taking part in family therapy or self-help groups with family members or friends.  When you talk with friends and family about your ADHD, be patient and communicate openly.  Take over-the-counter and prescription medicines only as told by your health care provider. Check with your health care provider before taking any new medicines. This information is not intended to replace advice givour health care provider. Document Released: 04/24/2016 Document Revised: 04/16/2018 Document Reviewed: 04/24/2016 Elsevier Patient Education  [place].

## 2018-12-19 ENCOUNTER — Encounter: Payer: Self-pay | Admitting: Family Medicine

## 2018-12-21 ENCOUNTER — Other Ambulatory Visit: Payer: Self-pay | Admitting: Family Medicine

## 2018-12-21 DIAGNOSIS — F902 Attention-deficit hyperactivity disorder, combined type: Secondary | ICD-10-CM

## 2018-12-21 MED ORDER — AMPHETAMINE-DEXTROAMPHETAMINE 20 MG PO TABS: 20.0000 mg | ORAL_TABLET | Freq: Two times a day (BID) | ORAL | 0 refills | Status: DC

## 2018-12-29 ENCOUNTER — Telehealth (INDEPENDENT_AMBULATORY_CARE_PROVIDER_SITE_OTHER): Payer: Medicare Other | Admitting: Family Medicine

## 2018-12-29 DIAGNOSIS — F902 Attention-deficit hyperactivity disorder, combined type: Secondary | ICD-10-CM | POA: Diagnosis not present

## 2018-12-29 DIAGNOSIS — F331 Major depressive disorder, recurrent, moderate: Secondary | ICD-10-CM | POA: Diagnosis not present

## 2018-12-29 MED ORDER — AMPHETAMINE-DEXTROAMPHETAMINE 20 MG PO TABS: 20.0000 mg | ORAL_TABLET | Freq: Two times a day (BID) | ORAL | 0 refills | Status: DC

## 2018-12-29 NOTE — Progress Notes (Signed)
Virtual Visit via Video Note  I connected with Michael Durham on 12/29/18 at  1:00 PM EST by a video enabled telemedicine application 2/2 [id number] pandemic and verified that I am speaking with the correct person using two identifiers.  Location patient: home Location provider:work or home office Persons participating in the virtual visit: patient, provider  I discussed the limitations of evaluation and management by telemedicine and the availability of in person appointments. The patient expressed understanding and agreed to proceed.   HPI: Pt is a 63 yo male with pmh sig for AHDD, depression, anxiety, diverticulosis, HLD, vit D def.  Pt states he has been doing well since last OFV.  Pt decided not to decrease lexapro from 20 mg to 10 mg until he has an appt with [facility].  Pt has yet to schedule a [facility] appt.    Taking Adderall 20 mg BID for ADHD.  ROS: See pertinent positives and negatives per HPI.  Past Medical History:  Diagnosis Date  . ADHD   . Anxiety   . Bipolar 2 disorder ([place])   . Depression   . Diverticulosis 08/03/2018  . ED (erectile dysfunction)   . Encephalitis, Age 66   . HLD (hyperlipidemia)   . Hx of colonic polyp - last colonoscopy with Dr. Alinda Sierras on 12/21/13 08/03/2018  . Vitamin D deficiency     Past Surgical History:  Procedure Laterality Date  . BRAIN SURGERY      Family History  Problem Relation Age of Onset  . Cancer Father   . Diabetes Brother   . Dementia Mother      Current Outpatient Medications:  .  amphetamine-dextroamphetamine (ADDERALL) 20 MG tablet, Take 1 tablet (20 mg total) by mouth 2 (two) times daily., Disp: 60 tablet, Rfl: 0 .  amphetamine-dextroamphetamine (ADDERALL) 20 MG tablet, Take 1 tablet (20 mg total) by mouth 2 (two) times daily., Disp: 60 tablet, Rfl: 0 .  ARIPiprazole (ABILIFY) 5 MG tablet, TAKE 1 TABLET(5 MG) BY MOUTH AT BEDTIME, Disp: 90 tablet, Rfl: 1 .  atorvastatin (LIPITOR) 20 MG tablet, TAKE 1 TABLET DAILY (NEED FOLLOW UP FOR  FURTHER REFILLS), Disp: 90 tablet, Rfl: 3 .  Cholecalciferol (VITAMIN D) 2000 units CAPS, Take 2,000 Units by mouth daily., Disp: , Rfl:  .  escitalopram (LEXAPRO) 20 MG tablet, TAKE 1 TABLET DAILY, Disp: 90 tablet, Rfl: 0 .  latanoprost (XALATAN) 0.005 % ophthalmic solution, INSTILL 1 DROP INTO EACH EYE AT BEDTIME, Disp: , Rfl:  .  tadalafil (CIALIS) 5 MG tablet, 1-3 tabs po daily as needed., Disp: 30 tablet, Rfl: 0  EXAM:  VITALS per patient if applicable: RR between 123456 bpm  GENERAL: Pleasant, alert, oriented, appears well and in no acute distress  HEENT: atraumatic, conjunctiva clear, no obvious abnormalities on inspection of external nose and ears  NECK: normal movements of the head and neck  LUNGS: on inspection no signs of respiratory distress, breathing rate appears normal, no obvious gross SOB, gasping or wheezing  CV: no obvious cyanosis  MS: moves all visible extremities without noticeable abnormality  PSYCH/NEURO: pleasant and cooperative, no obvious depression or anxiety, speech and thought processing grossly intact  ASSESSMENT AND PLAN:  Discussed the following assessment and plan:  Recurrent major depressive episodes, moderate (HCC) -stable -PHQ 9 score 7 last OFV 11/25/18 -GAD 7 score 1 last OFV 11/25/18 -Agree with continuing lexapro 20 mg daily until seen by [facility]. -given info for area providers.  (Dr. Casimiro Needle and Dr. Clovis Pu)  Pt encouraged  to schedule an appt. -will continue to monitor. -given precautions  Attention deficit hyperactivity disorder (ADHD), combined type  -Stable -refill sent, can be picked up on or after 01/21/19 - Plan: amphetamine-dextroamphetamine (ADDERALL) 20 MG tablet   F/u in 1-2 months, sooner if needed.  Of note: pt arrived to office for virtual visit.  Upon chart review , it seems a generic mychart message was sent last wk asking pt to arrive 15 min prior to appt.  I discussed the assessment and treatment plan with the patient.  The patient was provided an opportunity to ask questions and all were answered. The patient agreed with the plan and demonstrated an understanding of the instructions.   The patient was advised to call back or seek an in-person evaluation if the symptoms worsen or if the condition fails to improve as anticipated.   Billie Ruddy, MD

## 2019-01-09 ENCOUNTER — Encounter: Payer: Self-pay | Admitting: Family Medicine

## 2019-01-11 ENCOUNTER — Other Ambulatory Visit: Payer: Self-pay | Admitting: Family Medicine

## 2019-01-11 DIAGNOSIS — F3181 Bipolar II disorder: Secondary | ICD-10-CM

## 2019-01-11 MED ORDER — ESCITALOPRAM OXALATE 20 MG PO TABS: ORAL_TABLET | ORAL | 0 refills | Status: DC

## 2019-01-14 ENCOUNTER — Other Ambulatory Visit: Payer: Self-pay | Admitting: Family Medicine

## 2019-01-14 DIAGNOSIS — Z86010 Personal history of colonic polyps: Secondary | ICD-10-CM

## 2019-01-18 ENCOUNTER — Other Ambulatory Visit: Payer: Self-pay

## 2019-01-18 DIAGNOSIS — N529 Male erectile dysfunction, unspecified: Secondary | ICD-10-CM

## 2019-01-18 MED ORDER — TADALAFIL 5 MG PO TABS: ORAL_TABLET | ORAL | 0 refills | Status: DC

## 2019-01-25 ENCOUNTER — Encounter: Payer: Self-pay | Admitting: Gastroenterology

## 2019-01-25 ENCOUNTER — Encounter: Payer: Self-pay | Admitting: Family Medicine

## 2019-01-25 ENCOUNTER — Other Ambulatory Visit: Payer: Self-pay

## 2019-01-25 DIAGNOSIS — E785 Hyperlipidemia, unspecified: Secondary | ICD-10-CM

## 2019-01-25 MED ORDER — ATORVASTATIN CALCIUM 20 MG PO TABS: ORAL_TABLET | ORAL | 3 refills | Status: DC

## 2019-01-25 NOTE — Telephone Encounter (Signed)
Rx sent 

## 2019-02-10 ENCOUNTER — Encounter: Payer: Self-pay | Admitting: Family Medicine

## 2019-02-16 ENCOUNTER — Encounter: Payer: Self-pay | Admitting: Family Medicine

## 2019-02-21 ENCOUNTER — Other Ambulatory Visit: Payer: Self-pay | Admitting: Family Medicine

## 2019-02-21 DIAGNOSIS — F902 Attention-deficit hyperactivity disorder, combined type: Secondary | ICD-10-CM

## 2019-02-21 MED ORDER — AMPHETAMINE-DEXTROAMPHETAMINE 20 MG PO TABS: 20.0000 mg | ORAL_TABLET | Freq: Two times a day (BID) | ORAL | 0 refills | Status: DC

## 2019-02-24 ENCOUNTER — Ambulatory Visit: Payer: Medicare Other | Admitting: Gastroenterology

## 2019-03-15 ENCOUNTER — Encounter: Payer: 59 | Admitting: Family Medicine

## 2019-04-07 ENCOUNTER — Ambulatory Visit (INDEPENDENT_AMBULATORY_CARE_PROVIDER_SITE_OTHER): Payer: Medicare Other | Admitting: Gastroenterology

## 2019-04-07 ENCOUNTER — Encounter: Payer: Self-pay | Admitting: Gastroenterology

## 2019-04-07 ENCOUNTER — Encounter: Payer: Self-pay | Admitting: Family Medicine

## 2019-04-07 VITALS — BP 130/80 | HR 76 | Temp 97.6°F | Ht 69.0 in | Wt 202.4 lb

## 2019-04-07 DIAGNOSIS — Z86010 Personal history of colonic polyps: Secondary | ICD-10-CM

## 2019-04-07 DIAGNOSIS — Z1211 Encounter for screening for malignant neoplasm of colon: Secondary | ICD-10-CM | POA: Diagnosis not present

## 2019-04-07 MED ORDER — SUPREP BOWEL PREP KIT 17.5-3.13-1.6 GM/177ML PO SOLN: 1.0000 | ORAL | 0 refills | Status: DC

## 2019-04-07 NOTE — Patient Instructions (Signed)
If you are age 66 or older, your body mass index should be between 23-30. Your Body mass index is 29.89 kg/m. If this is out of the aforementioned range listed, please consider follow up with your Primary Care Provider.  If you are age 42 or younger, your body mass index should be between 19-25. Your Body mass index is 29.89 kg/m. If this is out of the aformentioned range listed, please consider follow up with your Primary Care Provider.    Due to recent changes in healthcare laws, you may see the results of your imaging and laboratory studies on MyChart before your provider has had a chance to review them.  We understand that in some cases there may be results that are confusing or concerning to you. Not all laboratory results come back in the same time frame and the provider may be waiting for multiple results in order to interpret others.  Please give [place] 48 hours in order for your provider to thoroughly review all the results before contacting the office for clarification of your results.   You have been scheduled for a colonoscopy. Please follow written instructions given to you at your visit today.  Please pick up your prep supplies at the pharmacy within the next 1-3 days. If you use inhalers (even only as needed), please bring them with you on the day of your procedure.  We have sent the following medications to your pharmacy for you to pick up at your convenience: Suprep   Thank you for choosing me and [facility].  Dr. Rush Landmark

## 2019-04-07 NOTE — Progress Notes (Signed)
[facility] VISIT   Primary Care Provider Billie Ruddy, MD [place] [address] [phone]  Referring Provider Billie Ruddy, MD [address] [place],  [place] [address] [phone]  Patient Profile: Michael Durham is a 64 y.o. male with a pmh significant for bipolar disorder, anxiety/depression, ADHD, ED, reported diverticulosis, hyperlipidemia, reported colon polyps.  The patient presents to the [facility] for an evaluation and management of problem(s) noted below:  Problem List 1. Hx of colonic polyps   2. Colon cancer screening     History of Present Illness This is the patient's first visit to the outpatient [facility] clinic.  The patient has had a prior colonoscopy within the last 6 years and has a history of colon polyps per his report.  We have not been able to obtain report of prior colonoscopy or pathology from colonoscopy.  He was told to have a 5-year follow-up and he is here for that at this time.  The patient describes having a bowel movement at least once daily.  His bowel movements are noted to be soft and formed.  He does not notice any blood in his stools (melena/hematochezia/maroon).  Patient describes no other significant GI symptoms as noted below.  The patient has never had an upper endoscopy.  Patient does not take significant nonsteroidals or BC/Goody powders.  GI Review of Systems Positive as above Negative for pyrosis, dysphagia, odynophagia, nausea, vomiting, pain, change in bowel habits  Review of Systems General: Denies fevers/chills/weight loss HEENT: Denies oral lesions Cardiovascular: Denies chest pain/palpitations Pulmonary: Denies shortness of breath Gastroenterological: See HPI Genitourinary: Denies darkened urine or hematuria Hematological: Denies easy bruising/bleeding Endocrine: Denies temperature intolerance Dermatological: Denies  jaundice Psychological: Mood is stable   Medications Current Outpatient Medications  Medication Sig Dispense Refill  . amphetamine-dextroamphetamine (ADDERALL) 20 MG tablet Take 1 tablet (20 mg total) by mouth 2 (two) times daily. 60 tablet 0  . ARIPiprazole (ABILIFY) 5 MG tablet TAKE 1 TABLET(5 MG) BY MOUTH AT BEDTIME 90 tablet 1  . atorvastatin (LIPITOR) 20 MG tablet Take 1 tablet by mouth daily 90 tablet 3  . Cholecalciferol (VITAMIN D) 2000 units CAPS Take 2,000 Units by mouth daily.    Marland Kitchen escitalopram (LEXAPRO) 20 MG tablet TAKE 1 TABLET DAILY 90 tablet 0  . latanoprost (XALATAN) 0.005 % ophthalmic solution INSTILL 1 DROP INTO EACH EYE AT BEDTIME    . tadalafil (CIALIS) 5 MG tablet 1-3 tabs po daily as needed. 30 tablet 0  . Na Sulfate-K Sulfate-Mg Sulf (SUPREP BOWEL PREP KIT) 17.5-3.13-1.6 GM/177ML SOLN Take 1 kit by mouth as directed. For colonoscopy prep 354 mL 0   No current facility-administered medications for this visit.    Allergies Allergies  Allergen Reactions  . Wellbutrin [Bupropion] Other (See Comments)    Severe headache.  . Tetanus Toxoids     "old horse serum preparation"    Histories Past Medical History:  Diagnosis Date  . ADHD   . Anxiety   . Bipolar 2 disorder ([place])   . Depression   . Diverticulosis 08/03/2018  . ED (erectile dysfunction)   . Encephalitis, Age 13   . HLD (hyperlipidemia)   . Hx of colonic polyp - last colonoscopy with Dr. Alinda Sierras on 12/21/13 08/03/2018  . Vitamin D deficiency    Past Surgical History:  Procedure Laterality Date  . BRAIN SURGERY    . MOUTH SURGERY     Social History  Socioeconomic History  . Marital status: Married    Spouse name: Not on file  . Number of children: Not on file  . Years of education: Not on file  . Highest education level: Not on file  Occupational History  . Not on file  Tobacco Use  . Smoking status: Former Research scientist (life sciences)  . Smokeless tobacco: Never Used  . Tobacco comment: Quit > 15 years ago,  smoked 1-2 ppd for 30 years  Substance and Sexual Activity  . Alcohol use: Yes    Comment: occasionally  . Drug use: No  . Sexual activity: Yes    Partners: Female    Birth control/protection: None  Other Topics Concern  . Not on file  Social History Narrative  . Not on file   Social Determinants of Health   Financial Resource Strain:   . Difficulty of Paying Living Expenses:   Food Insecurity: No Food Insecurity  . Worried About Charity fundraiser in the Last Year: Never true  . Ran Out of Food in the Last Year: Never true  Transportation Needs: No Transportation Needs  . Lack of Transportation (Medical): No  . Lack of Transportation (Non-Medical): No  Physical Activity: Inactive  . Days of Exercise per Week: 0 days  . Minutes of Exercise per Session: 0 min  Stress: Stress Concern Present  . Feeling of Stress : To some extent  Social Connections: Unknown  . Frequency of Communication with Friends and Family: Not on file  . Frequency of Social Gatherings with Friends and Family: Not on file  . Attends Religious Services: Not on file  . Active Member of Clubs or Organizations: Not on file  . Attends Archivist Meetings: Not on file  . Marital Status: Married  Human resources officer Violence: Not At Risk  . Fear of Current or Ex-Partner: No  . Emotionally Abused: No  . Physically Abused: No  . Sexually Abused: No   Family History  Problem Relation Age of Onset  . Prostate cancer Father   . Bladder Cancer Father   . Diabetes Brother   . Dementia Mother   . Breast cancer Maternal Aunt   . Colon cancer Neg Hx   . Esophageal cancer Neg Hx   . Stomach cancer Neg Hx   . Pancreatic cancer Neg Hx   . Inflammatory bowel disease Neg Hx   . Liver disease Neg Hx   . Rectal cancer Neg Hx    I have reviewed his medical, social, and family history in detail and updated the electronic medical record as necessary.    PHYSICAL EXAMINATION  BP 130/80   Pulse 76   Temp  97.6 F (36.4 C)   Ht _0  (1.753 m)   Wt 202 lb 6.4 oz (91.8 kg)   BMI 29.89 kg/m  Wt Readings from Last 3 Encounters:  04/07/19 202 lb 6.4 oz (91.8 kg)  11/25/18 201 lb (91.2 kg)  08/03/18 205 lb (93 kg)  GEN: NAD, appears stated age, doesn't appear chronically ill PSYCH: Cooperative, without pressured speech EYE: Conjunctivae pink, sclerae anicteric ENT: MMM CV: RR without R/Gs  RESP: CTAB posteriorly, without wheezing GI: NABS, soft, NT/ND, without rebound or guarding MSK/EXT: No significant lower extremity edema SKIN: No jaundice NEURO:  Alert & Oriented x 3, no focal deficits   REVIEW OF DATA  I reviewed the following data at the time of this encounter:  GI Procedures and Studies  Previously reported colonoscopy within 5 to 6  years and a history of polyps but unable to obtain pathology and was told to have a 5-year follow-up  Laboratory Studies  Reviewed those in epic  Imaging Studies  No relevant studies to review   ASSESSMENT  Mr. Vreeland is a 64 y.o. male with a pmh significant for bipolar disorder, anxiety/depression, ADHD, ED, reported diverticulosis, hyperlipidemia, reported colon polyps.  The patient is seen today for evaluation and management of:  1. Hx of colonic polyps   2. Colon cancer screening    The patient is hemodynamically and clinically stable.  There is a report of prior colon polyps with a plan for potential 5-year follow-up per referral status as much as the patient can recall.  Patient does not have any other significant red flag symptoms.  We will proceed with scheduling a colonoscopy for colon cancer screening and reported colon polyp surveillance.  The risks and benefits of endoscopic evaluation were discussed with the patient; these include but are not limited to the risk of perforation, infection, bleeding, missed lesions, lack of diagnosis, severe illness requiring hospitalization, as well as anesthesia and sedation related illnesses.  The  patient is agreeable to proceed.  All patient questions were answered, to the best of my ability, and the patient agrees to the aforementioned plan of action with follow-up as indicated.   PLAN  Proceed with scheduling colonoscopy for screening/polyp surveillance Recommend considering fiber supplementation at least once daily   Orders Placed This Encounter  Procedures  . Ambulatory referral to Gastroenterology    New Prescriptions   NA SULFATE-K SULFATE-MG SULF (SUPREP BOWEL PREP KIT) 17.5-3.13-1.6 GM/177ML SOLN    Take 1 kit by mouth as directed. For colonoscopy prep   Modified Medications   No medications on file    Planned Follow Up No follow-ups on file.   Total Time in Face-to-Face and in Coordination of Care for patient including independent/personal interpretation/review of prior testing, medical history, examination, medication adjustment, communicating results with the patient directly, and documentation with the EHR is 30 minutes.   Justice Britain, MD [facility] Office # [phone]

## 2019-04-07 NOTE — Telephone Encounter (Signed)
Last ov:11/25/2018 Last filled:02/21/19

## 2019-04-09 ENCOUNTER — Encounter: Payer: Self-pay | Admitting: Gastroenterology

## 2019-04-09 DIAGNOSIS — Z1211 Encounter for screening for malignant neoplasm of colon: Secondary | ICD-10-CM | POA: Insufficient documentation

## 2019-04-09 DIAGNOSIS — Z860100 Personal history of colon polyps, unspecified: Secondary | ICD-10-CM | POA: Insufficient documentation

## 2019-04-12 NOTE — Telephone Encounter (Signed)
Pt is scheduled for tomorrow 04/13/2019 at 2.30 pm for adderral refill

## 2019-04-13 ENCOUNTER — Encounter: Payer: Self-pay | Admitting: Family Medicine

## 2019-04-13 ENCOUNTER — Telehealth (INDEPENDENT_AMBULATORY_CARE_PROVIDER_SITE_OTHER): Payer: Medicare Other | Admitting: Family Medicine

## 2019-04-13 DIAGNOSIS — F902 Attention-deficit hyperactivity disorder, combined type: Secondary | ICD-10-CM

## 2019-04-13 DIAGNOSIS — F3181 Bipolar II disorder: Secondary | ICD-10-CM | POA: Diagnosis not present

## 2019-04-13 MED ORDER — AMPHETAMINE-DEXTROAMPHETAMINE 20 MG PO TABS: 20.0000 mg | ORAL_TABLET | Freq: Two times a day (BID) | ORAL | 0 refills | Status: DC

## 2019-04-13 MED ORDER — ESCITALOPRAM OXALATE 20 MG PO TABS: ORAL_TABLET | ORAL | 3 refills | Status: DC

## 2019-04-13 MED ORDER — ARIPIPRAZOLE 5 MG PO TABS: ORAL_TABLET | ORAL | 3 refills | Status: DC

## 2019-04-13 NOTE — Progress Notes (Signed)
Virtual Visit via Video Note  I connected with@ on 04/13/19 at  2:30 PM EDT by a video enabled telemedicine application 2/2 TOIZT-24 pandemic and verified that I am speaking with the correct person using two identifiers.  Location patient: home Location provider:work or home office Persons participating in the virtual visit: patient, provider  I discussed the limitations of evaluation and management by telemedicine and the availability of in person appointments. The patient expressed understanding and agreed to proceed.   HPI: Pt states he is doing well, trying to stay busy.  Pt got his 2nd COVID vaccine.  States his wife gets hers over the wknd.  Pt did have fever, chills, fatigue after the 2nd vaccine which lasted 24 hrs.  Pt states his mood, sleep, and appetite are good.  Pt requesting a refill on Adderall 20 mg, Lexapro, and Abilify.   ROS: See pertinent positives and negatives per HPI.  Past Medical History:  Diagnosis Date  . ADHD   . Anxiety   . Bipolar 2 disorder ([place])   . Depression   . Diverticulosis 08/03/2018  . ED (erectile dysfunction)   . Encephalitis, Age 77   . HLD (hyperlipidemia)   . Hx of colonic polyp - last colonoscopy with Dr. Alinda Sierras on 12/21/13 08/03/2018  . Vitamin D deficiency     Past Surgical History:  Procedure Laterality Date  . BRAIN SURGERY    . MOUTH SURGERY      Family History  Problem Relation Age of Onset  . Prostate cancer Father   . Bladder Cancer Father   . Diabetes Brother   . Dementia Mother   . Breast cancer Maternal Aunt   . Colon cancer Neg Hx   . Esophageal cancer Neg Hx   . Stomach cancer Neg Hx   . Pancreatic cancer Neg Hx   . Inflammatory bowel disease Neg Hx   . Liver disease Neg Hx   . Rectal cancer Neg Hx      Current Outpatient Medications:  .  amphetamine-dextroamphetamine (ADDERALL) 20 MG tablet, Take 1 tablet (20 mg total) by mouth 2 (two) times daily., Disp: 60 tablet, Rfl: 0 .  ARIPiprazole (ABILIFY) 5 MG  tablet, TAKE 1 TABLET(5 MG) BY MOUTH AT BEDTIME, Disp: 90 tablet, Rfl: 1 .  atorvastatin (LIPITOR) 20 MG tablet, Take 1 tablet by mouth daily, Disp: 90 tablet, Rfl: 3 .  Cholecalciferol (VITAMIN D) 2000 units CAPS, Take 2,000 Units by mouth daily., Disp: , Rfl:  .  escitalopram (LEXAPRO) 20 MG tablet, TAKE 1 TABLET DAILY, Disp: 90 tablet, Rfl: 0 .  latanoprost (XALATAN) 0.005 % ophthalmic solution, INSTILL 1 DROP INTO EACH EYE AT BEDTIME, Disp: , Rfl:  .  Na Sulfate-K Sulfate-Mg Sulf (SUPREP BOWEL PREP KIT) 17.5-3.13-1.6 GM/177ML SOLN, Take 1 kit by mouth as directed. For colonoscopy prep, Disp: 354 mL, Rfl: 0 .  tadalafil (CIALIS) 5 MG tablet, 1-3 tabs po daily as needed., Disp: 30 tablet, Rfl: 0  EXAM:  VITALS per patient if applicable:  RR between 12-20 bpm  GENERAL: alert, oriented, appears well and in no acute distress  HEENT: atraumatic, conjunctiva clear, no obvious abnormalities on inspection of external nose and ears  NECK: normal movements of the head and neck  LUNGS: on inspection no signs of respiratory distress, breathing rate appears normal, no obvious gross SOB, gasping or wheezing  CV: no obvious cyanosis  MS: moves all visible extremities without noticeable abnormality  PSYCH/NEURO: pleasant and cooperative, no obvious depression or anxiety,  speech and thought processing grossly intact  ASSESSMENT AND PLAN:  Discussed the following assessment and plan:  Attention deficit hyperactivity disorder (ADHD), combined type  -stable -PDMP database reviewed. - Plan: amphetamine-dextroamphetamine (ADDERALL) 20 MG tablet, amphetamine-dextroamphetamine (ADDERALL) 20 MG tablet, amphetamine-dextroamphetamine (ADDERALL) 20 MG tablet  Bipolar 2 disorder ([facility])  -stable -f/u with BH encouraged. - Plan: escitalopram (LEXAPRO) 20 MG tablet, ARIPiprazole (ABILIFY) 5 MG tablet  F/u in 3 months, sooner if needed.  CPE in June.   I discussed the assessment and treatment plan with  the patient. The patient was provided an opportunity to ask questions and all were answered. The patient agreed with the plan and demonstrated an understanding of the instructions.   The patient was advised to call back or seek an in-person evaluation if the symptoms worsen or if the condition fails to improve as anticipated.   Billie Ruddy, MD

## 2019-05-19 ENCOUNTER — Telehealth: Payer: Self-pay | Admitting: Gastroenterology

## 2019-05-19 NOTE — Telephone Encounter (Signed)
Thank you for update. Understood. GM

## 2019-05-19 NOTE — Telephone Encounter (Signed)
Dr. Rush Landmark, this patient rescheduled his 05/20/19 colonoscopy to 06/17/19.  He stated that he just read prep instructions today and had not been on a low-fibre diet.  He reported that he has eaten nuts and seeds.

## 2019-05-20 ENCOUNTER — Encounter: Payer: Medicare Other | Admitting: Gastroenterology

## 2019-06-04 ENCOUNTER — Encounter: Payer: Self-pay | Admitting: Family Medicine

## 2019-06-14 ENCOUNTER — Ambulatory Visit (AMBULATORY_SURGERY_CENTER): Payer: Medicare Other | Admitting: Gastroenterology

## 2019-06-14 ENCOUNTER — Other Ambulatory Visit: Payer: Self-pay

## 2019-06-14 ENCOUNTER — Encounter: Payer: Self-pay | Admitting: Gastroenterology

## 2019-06-14 VITALS — BP 128/73 | HR 53 | Temp 98.0°F | Resp 12 | Ht 69.0 in | Wt 202.0 lb

## 2019-06-14 DIAGNOSIS — Z86010 Personal history of colonic polyps: Secondary | ICD-10-CM

## 2019-06-14 DIAGNOSIS — D123 Benign neoplasm of transverse colon: Secondary | ICD-10-CM

## 2019-06-14 DIAGNOSIS — D122 Benign neoplasm of ascending colon: Secondary | ICD-10-CM

## 2019-06-14 DIAGNOSIS — D128 Benign neoplasm of rectum: Secondary | ICD-10-CM

## 2019-06-14 DIAGNOSIS — Z1211 Encounter for screening for malignant neoplasm of colon: Secondary | ICD-10-CM

## 2019-06-14 MED ORDER — SODIUM CHLORIDE 0.9 % IV SOLN: 500.0000 mL | Freq: Once | INTRAVENOUS | Status: DC

## 2019-06-14 NOTE — Progress Notes (Signed)
VS-CW 

## 2019-06-14 NOTE — Progress Notes (Signed)
Called to room to assist during endoscopic procedure.  Patient ID and intended procedure confirmed with present staff. Received instructions for my participation in the procedure from the performing physician.  

## 2019-06-14 NOTE — Op Note (Signed)
[facility] Patient Name: Michael Durham Procedure Date: 06/14/2019 2:27 PM MRN: [id number] Endoscopist: Justice Britain , MD Age: 64 Referring MD:  Date of Birth: [birth year] Gender: Male Account #: [id number] Procedure:                Colonoscopy Indications:              High risk colon cancer surveillance: Personal                            history of colonic polyps Medicines:                Monitored Anesthesia Care Procedure:                Pre-Anesthesia Assessment:                           - Prior to the procedure, a History and Physical                            was performed, and patient medications and                            allergies were reviewed. The patient's tolerance of                            previous anesthesia was also reviewed. The risks                            and benefits of the procedure and the sedation                            options and risks were discussed with the patient.                            All questions were answered, and informed consent                            was obtained. Prior Anticoagulants: The patient has                            taken no previous anticoagulant or antiplatelet                            agents. ASA Grade Assessment: II - A patient with                            mild systemic disease. After reviewing the risks                            and benefits, the patient was deemed in                            satisfactory condition to undergo the procedure.  After obtaining informed consent, the colonoscope                            was passed under direct vision. Throughout the                            procedure, the patient's blood pressure, pulse, and                            oxygen saturations were monitored continuously. The                            Colonoscope was introduced through the anus and                            advanced to the the cecum, identified  by                            appendiceal orifice and ileocecal valve. The                            colonoscopy was performed without difficulty. The                            patient tolerated the procedure. The quality of the                            bowel preparation was fair. The ileocecal valve,                            appendiceal orifice, and rectum were photographed. Scope In: 2:42:50 PM Scope Out: 3:17:08 PM Scope Withdrawal Time: 0 hours 31 minutes 19 seconds  Total Procedure Duration: 0 hours 34 minutes 18 seconds  Findings:                 Skin tags were found on perianal exam.                           The digital rectal exam findings include                            hemorrhoids. Pertinent negatives include no                            palpable rectal lesions.                           Copious quantities of semi-liquid stool was found                            in the entire colon, precluding visualization.                            Lavage of the area was performed using copious  amounts, resulting in incomplete clearance with                            fair visualization.                           12 sessile polyps were found in the rectum (3),                            transverse colon (4) and ascending colon (5). The                            polyps were 2 to 8 mm in size. These polyps were                            removed with a cold snare. Resection and retrieval                            were complete.                           Multiple polyps noted in the rectum - likely                            hyperplastic in appearance >30.                           Multiple small-mouthed diverticula were found in                            the recto-sigmoid colon, sigmoid colon and                            descending colon.                           Non-bleeding non-thrombosed external and internal                             hemorrhoids were found during retroflexion, during                            perianal exam and during digital exam. The                            hemorrhoids were Grade II (internal hemorrhoids                            that prolapse but reduce spontaneously). Complications:            No immediate complications. Estimated Blood Loss:     Estimated blood loss was minimal. Impression:               - Perianal skin tags and hemorrhoids found on  perianal exam/digital rectal exam.                           - Stool in the entire examined colon. Preparation                            of the colon was fair even after copious lavage.                           - 12, 2 to 8 mm polyps in the rectum, in the                            transverse colon and in the ascending colon,                            removed with a cold snare. Resected and retrieved.                           - Hyperplastic appearing polyps in the rectum.                           - Diverticulosis in the recto-sigmoid colon, in the                            sigmoid colon and in the descending colon.                           - Non-bleeding non-thrombosed external and internal                            hemorrhoids. Recommendation:           - The patient will be observed post-procedure,                            until all discharge criteria are met.                           - Discharge patient to home.                           - Patient has a contact number available for                            emergencies. The signs and symptoms of potential                            delayed complications were discussed with the                            patient. Return to normal activities tomorrow.                            Written discharge instructions were provided to the  patient.                           - Use FiberCon 1-2 tablets PO daily.                           -  Continue present medications.                           - Await pathology results.                           - Repeat colonoscopy within next 6-12 months for                            surveillance/screening purposes due to inadequate                            fair preparation even after extensive lavage and                            the number of polyps with concern for other polyps                            that could have been missed. Will need a 2-day                            preparation and once daily Miralax for 1-week prior                            to procedure.                           - The findings and recommendations were discussed                            with the patient. Justice Britain, MD 06/14/2019 3:27:04 PM

## 2019-06-14 NOTE — Progress Notes (Signed)
pt tolerated well. VSS. awake and to recovery. Report given to RN.  

## 2019-06-14 NOTE — Patient Instructions (Signed)
Please read handouts provided. Continue present medications. Await pathology results. Use FiberCon 1-2 tablets daily.     YOU HAD AN ENDOSCOPIC PROCEDURE TODAY AT [facility]:   Refer to the procedure report that was given to you for any specific questions about what was found during the examination.  If the procedure report does not answer your questions, please call your gastroenterologist to clarify.  If you requested that your care partner not be given the details of your procedure findings, then the procedure report has been included in a sealed envelope for you to review at your convenience later.  YOU SHOULD EXPECT: Some feelings of bloating in the abdomen. Passage of more gas than usual.  Walking can help get rid of the air that was put into your GI tract during the procedure and reduce the bloating. If you had a lower endoscopy (such as a colonoscopy or flexible sigmoidoscopy) you may notice spotting of blood in your stool or on the toilet paper. If you underwent a bowel prep for your procedure, you may not have a normal bowel movement for a few days.  Please Note:  You might notice some irritation and congestion in your nose or some drainage.  This is from the oxygen used during your procedure.  There is no need for concern and it should clear up in a day or so.  SYMPTOMS TO REPORT IMMEDIATELY:   Following lower endoscopy (colonoscopy or flexible sigmoidoscopy):  Excessive amounts of blood in the stool  Significant tenderness or worsening of abdominal pains  Swelling of the abdomen that is new, acute  Fever of 100F or higher    For urgent or emergent issues, a gastroenterologist can be reached at any hour by calling [phone]. Do not use MyChart messaging for urgent concerns.    DIET:  We do recommend a small meal at first, but then you may proceed to your regular diet.  Drink plenty of fluids but you should avoid alcoholic beverages for 24  hours.  ACTIVITY:  You should plan to take it easy for the rest of today and you should NOT DRIVE or use heavy machinery until tomorrow (because of the sedation medicines used during the test).    FOLLOW UP: Our staff will call the number listed on your records 48-72 hours following your procedure to check on you and address any questions or concerns that you may have regarding the information given to you following your procedure. If we do not reach you, we will leave a message.  We will attempt to reach you two times.  During this call, we will ask if you have developed any symptoms of COVID 19. If you develop any symptoms (ie: fever, flu-like symptoms, shortness of breath, cough etc.) before then, please call [phone].  If you test positive for Covid 19 in the 2 weeks post procedure, please call and report this information to [place].    If any biopsies were taken you will be contacted by phone or by letter within the next 1-3 weeks.  Please call us at [phone] if you have not heard about the biopsies in 3 weeks.    SIGNATURES/CONFIDENTIALITY: You and/or your care partner have signed paperwork which will be entered into your electronic medical record.  These signatures attest to the fact that that the information above on your After Visit Summary has been reviewed and is understood.  Full responsibility of the confidentiality of this discharge information lies with you and/or  your care-partner. 

## 2019-06-16 ENCOUNTER — Telehealth: Payer: Self-pay | Admitting: *Deleted

## 2019-06-16 NOTE — Telephone Encounter (Signed)
°  Follow up Call-  Call back number 06/14/2019  Post procedure Call Back phone  # [phone]  Permission to leave phone message Yes  Some recent data might be hidden     Patient questions:  Do you have a fever, pain , or abdominal swelling? No. Pain Score  0 *  Have you tolerated food without any problems? Yes.    Have you been able to return to your normal activities? Yes.    Do you have any questions about your discharge instructions: Diet   No. Medications  No. Follow up visit  No.  Do you have questions or concerns about your Care? No.  Actions: * If pain score is 4 or above: No action needed, pain <4.  1. Have you developed a fever since your procedure? no  2.   Have you had an respiratory symptoms (SOB or cough) since your procedure? no  3.   Have you tested positive for COVID 19 since your procedure no  4.   Have you had any family members/close contacts diagnosed with the COVID 19 since your procedure?  no   If yes to any of these questions please route to Joylene Rod, RN and Erenest Rasher, RN

## 2019-06-17 ENCOUNTER — Encounter: Payer: Medicare Other | Admitting: Gastroenterology

## 2019-06-17 ENCOUNTER — Encounter: Payer: Self-pay | Admitting: Gastroenterology

## 2019-07-29 ENCOUNTER — Encounter: Payer: Self-pay | Admitting: Family Medicine

## 2019-08-08 ENCOUNTER — Telehealth: Payer: Self-pay | Admitting: Family Medicine

## 2019-08-08 NOTE — Telephone Encounter (Signed)
Pt called about his prescription of  amphetamine-dextroamphetamine (ADDERALL) 20 MG tablet   Stated he is out of meds and need it to be refilled  Pharmacy at [facility] on [address]  Please advise

## 2019-08-08 NOTE — Telephone Encounter (Signed)
Attempted to call pt to schedule for  F/U  for his Adderall refill, left a detailed message for pt to call the office, a MyChart message sent to pt  as well.

## 2019-08-09 NOTE — Telephone Encounter (Signed)
Left a second detailed message on pt mobile number and also send a MyChart message for pt to call the office and schedule a f/u for adderall refill

## 2019-08-09 NOTE — Telephone Encounter (Signed)
Pt has appt tomorrow with pcp.

## 2019-08-10 ENCOUNTER — Telehealth (INDEPENDENT_AMBULATORY_CARE_PROVIDER_SITE_OTHER): Payer: Medicare Other | Admitting: Family Medicine

## 2019-08-10 ENCOUNTER — Encounter: Payer: Self-pay | Admitting: Family Medicine

## 2019-08-10 DIAGNOSIS — M79671 Pain in right foot: Secondary | ICD-10-CM | POA: Diagnosis not present

## 2019-08-10 DIAGNOSIS — L600 Ingrowing nail: Secondary | ICD-10-CM

## 2019-08-10 DIAGNOSIS — F902 Attention-deficit hyperactivity disorder, combined type: Secondary | ICD-10-CM | POA: Diagnosis not present

## 2019-08-10 MED ORDER — AMPHETAMINE-DEXTROAMPHETAMINE 20 MG PO TABS: 20.0000 mg | ORAL_TABLET | Freq: Two times a day (BID) | ORAL | 0 refills | Status: DC

## 2019-08-10 NOTE — Progress Notes (Signed)
Virtual Visit via Video Note Connected with pt via video visit.  Video was working, however audio was not.  Pt called by this provider for audio.  I connected with Michael Durham on 08/10/19 at 10:00 AM EDT by a video enabled telemedicine application 2/2 [id number] pandemic and verified that I am speaking with the correct person using two identifiers.  Location patient: home Location provider:work or home office Persons participating in the virtual visit: patient, provider  I discussed the limitations of evaluation and management by telemedicine and the availability of in person appointments. The patient expressed understanding and agreed to proceed.   HPI: Pt is a 64 yo male with pmh sig for ADHD, bipolar 2 d/o, h/o MDD, h/o diverticulosis, ED, HLD, and anxiety who was seen for f/u.  On R foot pt has ingrown R toenail and MTP joint of R great toe pain.  Pain is a constant 3-4/10 but can get worse if shoe rubs against it.  Pt seen by someone in the past but wants a second opinion as was advised had bone spurs and would require fusion.  Pt states sleep and appetite are good. Concentration is good for the most part.  Notes some difficulty in am prior to taking Adderall 20 mg.  Denies changes in weight.  Staying busy around the house with various projects.  ROS: See pertinent positives and negatives per HPI.  Past Medical History:  Diagnosis Date  . ADHD   . Allergy   . Anxiety   . Bipolar 2 disorder ([place])   . Depression   . Diverticulosis   . ED (erectile dysfunction)   . Encephalitis, Age 31   . HLD (hyperlipidemia)   . Hx of colonic polyp - last colonoscopy with Dr. Alinda Sierras on 12/21/13 08/03/2018  . Vitamin D deficiency     Past Surgical History:  Procedure Laterality Date  . BRAIN SURGERY    . COLONOSCOPY    . MOUTH SURGERY      Family History  Problem Relation Age of Onset  . Prostate cancer Father   . Bladder Cancer Father   . Diabetes Brother   . Dementia Mother   .  Breast cancer Maternal Aunt   . Colon cancer Neg Hx   . Esophageal cancer Neg Hx   . Stomach cancer Neg Hx   . Pancreatic cancer Neg Hx   . Inflammatory bowel disease Neg Hx   . Liver disease Neg Hx   . Rectal cancer Neg Hx     Current Outpatient Medications:  .  amphetamine-dextroamphetamine (ADDERALL) 20 MG tablet, Take 1 tablet (20 mg total) by mouth 2 (two) times daily., Disp: 60 tablet, Rfl: 0 .  ARIPiprazole (ABILIFY) 5 MG tablet, TAKE 1 TABLET(5 MG) BY MOUTH AT BEDTIME, Disp: 90 tablet, Rfl: 3 .  atorvastatin (LIPITOR) 20 MG tablet, Take 1 tablet by mouth daily, Disp: 90 tablet, Rfl: 3 .  Cholecalciferol (VITAMIN D) 2000 units CAPS, Take 2,000 Units by mouth daily., Disp: , Rfl:  .  escitalopram (LEXAPRO) 20 MG tablet, TAKE 1 TABLET DAILY, Disp: 90 tablet, Rfl: 3 .  tadalafil (CIALIS) 5 MG tablet, 1-3 tabs po daily as needed., Disp: 30 tablet, Rfl: 0  EXAM:  VITALS per patient if applicable:  RR between 12-20 bpm  GENERAL: alert, oriented, appears well and in no acute distress  HEENT: atraumatic, conjunctiva clear, no obvious abnormalities on inspection of external nose and ears  NECK: normal movements of the head and neck  LUNGS: on inspection no signs of respiratory distress, breathing rate appears normal, no obvious gross SOB, gasping or wheezing  CV: no obvious cyanosis  MS: moves all visible extremities without noticeable abnormality  PSYCH/NEURO: pleasant and cooperative, no obvious depression or anxiety, speech and thought processing grossly intact  ASSESSMENT AND PLAN:  Discussed the following assessment and plan:  Ingrown toenail of right foot  -Discussed supportive care including soaking toe in Epsom salt, using cotton or dental floss to prevent edge of toenail from digging into the skin - Plan: Ambulatory referral to Podiatry  Right foot pain  -2/2 h/o previous injury and possible bone spurs as noted on imaging. - Plan: Ambulatory referral to  Podiatry  Attention deficit hyperactivity disorder (ADHD), combined type -Stable -PDMP reviewed - Plan: amphetamine-dextroamphetamine (ADDERALL) 20 MG tablet, amphetamine-dextroamphetamine (ADDERALL) 20 MG tablet     I discussed the assessment and treatment plan with the patient. The patient was provided an opportunity to ask questions and all were answered. The patient agreed with the plan and demonstrated an understanding of the instructions.   The patient was advised to call back or seek an in-person evaluation if the symptoms worsen or if the condition fails to improve as anticipated.  Billie Ruddy, MD

## 2019-08-11 ENCOUNTER — Ambulatory Visit (INDEPENDENT_AMBULATORY_CARE_PROVIDER_SITE_OTHER): Payer: Medicare Other | Admitting: Podiatry

## 2019-08-11 ENCOUNTER — Other Ambulatory Visit: Payer: Self-pay

## 2019-08-11 ENCOUNTER — Encounter: Payer: Self-pay | Admitting: Podiatry

## 2019-08-11 ENCOUNTER — Ambulatory Visit (INDEPENDENT_AMBULATORY_CARE_PROVIDER_SITE_OTHER): Payer: Medicare Other

## 2019-08-11 DIAGNOSIS — M2011 Hallux valgus (acquired), right foot: Secondary | ICD-10-CM

## 2019-08-11 DIAGNOSIS — M2012 Hallux valgus (acquired), left foot: Secondary | ICD-10-CM | POA: Diagnosis not present

## 2019-08-11 DIAGNOSIS — L600 Ingrowing nail: Secondary | ICD-10-CM | POA: Diagnosis not present

## 2019-08-11 DIAGNOSIS — M2021 Hallux rigidus, right foot: Secondary | ICD-10-CM

## 2019-08-11 NOTE — Progress Notes (Signed)
Subjective:   Patient ID: Michael Durham, male   DOB: 64 y.o.   MRN: [id number]   HPI Patient presents stating his big toe joint right is getting worse and I would like it taken care of and I have an ingrown toenail on my big toe right that I am constantly having to work on.  States it is gradually become more of an issue over the years for him   ROS      Objective:  Physical Exam  Neurovascular status intact with significant arthritis around the big toe joint right with reduced range of motion that is significant and pain around the joint surface with inflammation along with incurvated lateral border right hallux      Assessment:  Chronic hallux limitus rigidus condition right along with ingrown toenail chronic right     Plan:  H&P reviewed condition and I recommended that due to the longstanding nature spur formation that we consider joint implantation with removal of spurs versus fusion.  I reviewed pros and cons of this and he wants it done and at this point I allowed him to read consent form going over alternative treatments complications and the fact total recovery can take 6 months to 1 year.  Patient wants surgery understands recovery is willing to accept risk and will have ingrown fixed during the postoperative.  Patient is scheduled for outpatient surgery and is encouraged to call us with questions and had air fracture walker dispensed today with all instructions on usage and I want him to wear it prior to surgery to get used to it prior to the surgery he will need  X-rays indicate significant severe arthritis around the first MPJ right with dorsal medial lateral spurring with no indications of pathology left

## 2019-08-17 ENCOUNTER — Telehealth: Payer: Self-pay | Admitting: *Deleted

## 2019-08-17 NOTE — Telephone Encounter (Signed)
"  I am calling to set up surgery with Dr. Paulla Dolly."

## 2019-08-17 NOTE — Telephone Encounter (Signed)
Michael Durham, I've been trying to reach you for almost a week now to schedule sx. Please give me a call back. Thank you.

## 2019-08-17 NOTE — Telephone Encounter (Signed)
Called pt to get him scheduled for sx. Apologized for just now contacting him and explained I'm filling in for [facility], our surgical coordinator. Scheduled pt for sx on Tuesday, 08-31. Told pt if he hasn't already, to go ahead and register online with [facility] [employer] for [facility] and if he was unable to do so, that someone from there would call and get the information they needed over the phone. Pt asked what time his sx would be and I explained I couldn't give him a time but that someone would call from the sx center a day or two prior to let him know what time to arrive. Told pt to call in the meantime with any questions and or concerns.

## 2019-08-17 NOTE — Telephone Encounter (Signed)
I'm trying to schedule sx for my foot. Please give me a call back.

## 2019-09-05 ENCOUNTER — Telehealth: Payer: Self-pay | Admitting: Podiatry

## 2019-09-05 MED ORDER — OXYCODONE-ACETAMINOPHEN 10-325 MG PO TABS: 1.0000 | ORAL_TABLET | ORAL | 0 refills | Status: DC | PRN

## 2019-09-05 MED ORDER — OXYCODONE-ACETAMINOPHEN 5-325 MG PO TABS: 1.0000 | ORAL_TABLET | ORAL | Status: AC | PRN

## 2019-09-05 NOTE — Addendum Note (Signed)
Addended by: Wallene Huh on: 09/05/2019 04:10 PM   Modules accepted: Orders

## 2019-09-05 NOTE — Addendum Note (Signed)
Addended by: Wallene Huh on: 09/05/2019 05:36 PM   Modules accepted: Orders

## 2019-09-05 NOTE — Telephone Encounter (Signed)
Pharmacy called with questions regarding patients prescription for Oxycodone.  Please give pharmacy a call back.

## 2019-09-06 ENCOUNTER — Other Ambulatory Visit: Payer: Self-pay | Admitting: Podiatry

## 2019-09-06 ENCOUNTER — Encounter: Payer: Self-pay | Admitting: *Deleted

## 2019-09-06 ENCOUNTER — Telehealth: Payer: Self-pay | Admitting: *Deleted

## 2019-09-06 DIAGNOSIS — M2021 Hallux rigidus, right foot: Secondary | ICD-10-CM

## 2019-09-06 MED ORDER — OXYCODONE-ACETAMINOPHEN 5-325 MG PO TABS: 1.0000 | ORAL_TABLET | ORAL | 0 refills | Status: DC | PRN

## 2019-09-06 NOTE — Telephone Encounter (Signed)
Pharmacist from [facility] on [address] in [place], [place] called yesterday (09/05/19) regarding concerns they had about the Oxycodone 10-325 mg that Dr. Paulla Dolly sent for patient.  Patient had surgery on 09/06/19 Vilinda Blanks Implant RT  I told pharmacist that Dr. Paulla Dolly gave prescription for patient because they were having surgery on Tuesday, September 06, 2019.  They stated, "Patient has never been on a controlled substance and was Opiod Naive".  Pharmacist stated patient could have:  Oxycodone 5-325 mg every 4 hours. Max is 6 tablets a day today. The max amount of pain pills patient can have is 42 tablets with no refills. This is about a 7 days worth of pain medication.  Dr. Paulla Dolly did re-submit Oxycodone 5-325 mg to patient's pharmacy on 09/05/19 for patient's surgery today.

## 2019-09-06 NOTE — Telephone Encounter (Signed)
error 

## 2019-09-09 ENCOUNTER — Encounter: Payer: Self-pay | Admitting: Family Medicine

## 2019-09-14 ENCOUNTER — Other Ambulatory Visit: Payer: Self-pay

## 2019-09-14 ENCOUNTER — Ambulatory Visit (INDEPENDENT_AMBULATORY_CARE_PROVIDER_SITE_OTHER): Payer: Medicare Other

## 2019-09-14 ENCOUNTER — Encounter: Payer: Self-pay | Admitting: Podiatry

## 2019-09-14 ENCOUNTER — Ambulatory Visit (INDEPENDENT_AMBULATORY_CARE_PROVIDER_SITE_OTHER): Payer: Medicare Other | Admitting: Podiatry

## 2019-09-14 DIAGNOSIS — M2011 Hallux valgus (acquired), right foot: Secondary | ICD-10-CM

## 2019-09-14 DIAGNOSIS — M2012 Hallux valgus (acquired), left foot: Secondary | ICD-10-CM

## 2019-09-15 NOTE — Progress Notes (Signed)
Subjective:   Patient ID: Tenna Delaine, male   DOB: 64 y.o.   MRN: [id number]   HPI Patient states he is doing well and states that he is very pleased so far with his surgery and he also needs his big toenail taken off during this postoperative.. States he is walking well with the boot and has been elevating   ROS      Objective:  Physical Exam  Neurovascular status intact negative Bevelyn Buckles' sign was noted with patient's right foot healing well wound edges well coapted first MPJ has excellent range of motion no crepitus of the joint surface noted     Assessment:  Doing well post implant procedure first MPJ right     Plan:  H&P reviewed condition recommended continued range of motion dispensed surgical shoe continue immobilization elevation compression and at next visit we will also remove the toenail  X-ray indicates that the implant is in place good alignment is noted with slight gapping lateral but will not be any problem for this patient

## 2019-09-30 ENCOUNTER — Encounter: Payer: Medicare Other | Admitting: Podiatry

## 2019-10-05 ENCOUNTER — Ambulatory Visit (INDEPENDENT_AMBULATORY_CARE_PROVIDER_SITE_OTHER): Payer: Medicare Other

## 2019-10-05 ENCOUNTER — Other Ambulatory Visit: Payer: Self-pay

## 2019-10-05 ENCOUNTER — Ambulatory Visit (INDEPENDENT_AMBULATORY_CARE_PROVIDER_SITE_OTHER): Payer: Medicare Other | Admitting: Podiatry

## 2019-10-05 ENCOUNTER — Encounter: Payer: Self-pay | Admitting: Podiatry

## 2019-10-05 DIAGNOSIS — M2021 Hallux rigidus, right foot: Secondary | ICD-10-CM | POA: Diagnosis not present

## 2019-10-05 NOTE — Progress Notes (Signed)
Subjective:   Patient ID: Michael Durham, male   DOB: 64 y.o.   MRN: [id number]   HPI Patient presents stating I am doing very well with surgery and I wanted my nail removed but I am going out of town went away to I get back   ROS      Objective:  Physical Exam  Neurovascular status intact negative Bevelyn Buckles' sign noted with range of motion of the first MPJ excellent with mild dysfunction of the extensor tendon where she had had a tear of the tendon secondary to the bone spur formation with a thickened damaged right hallux toenail     Assessment:  Doing well post implant procedure first MPJ right with damaged right hallux nail     Plan:  H&P reviewed x-ray and discussed nail removal which he wants done but will get away 3 to 4 weeks to do.  I went ahead and I can allow him to return to tennis shoes and soft shoes we did discuss the extensor tendon we will see how it responds to tennis shoe usage.  Compression stocking dispensed today reappoint to recheck and reviewed x-ray  X-rays indicate the implants in place good alignment noted currently with excellent resection of bone spurs

## 2019-10-19 ENCOUNTER — Telehealth: Payer: Self-pay | Admitting: Family Medicine

## 2019-10-19 NOTE — Telephone Encounter (Signed)
Left message for patient to schedule Annual Wellness Visit.  Please schedule with Nurse Health Advisor Shannon Crews, RN at [facility]  

## 2019-10-26 ENCOUNTER — Other Ambulatory Visit: Payer: Self-pay

## 2019-10-26 ENCOUNTER — Ambulatory Visit (INDEPENDENT_AMBULATORY_CARE_PROVIDER_SITE_OTHER): Payer: Medicare Other

## 2019-10-26 ENCOUNTER — Ambulatory Visit (INDEPENDENT_AMBULATORY_CARE_PROVIDER_SITE_OTHER): Payer: Medicare Other | Admitting: Podiatry

## 2019-10-26 DIAGNOSIS — L600 Ingrowing nail: Secondary | ICD-10-CM | POA: Diagnosis not present

## 2019-10-26 DIAGNOSIS — M2021 Hallux rigidus, right foot: Secondary | ICD-10-CM | POA: Diagnosis not present

## 2019-10-26 MED ORDER — NEOMYCIN-POLYMYXIN-HC 3.5-10000-1 OT SOLN: OTIC | 0 refills | Status: DC

## 2019-10-26 NOTE — Progress Notes (Signed)
Subjective:   Patient ID: Michael Durham, male   DOB: 64 y.o.   MRN: [id number]   HPI Patient presents stating that he is very pleased with his surgery and is here to have his big toenail taken off today   ROS      Objective:  Physical Exam  Neurovascular status intact negative Bevelyn Buckles' sign noted excellent range of motion first MPJ with incision site healed well wound edges well coapted with a thickened incurvated right hallux nail     Assessment:  Doing well post implant procedure right with thickened dystrophic big toenail right     Plan:  H&P conditions reviewed recommended nail removal.  He wants this done I explained procedure risk and I infiltrated the right hallux 60 mg like Marcaine mixture sterile prep done and using sterile instrumentation remove the hallux nail exposed matrix and applied phenol 5 applications 30 seconds followed by alcohol lavage sterile dressing.  Gave instructions on soaks and to leave dressing on 24 hours but take it off earlier if it should start to throb and encouraged patient to call with questions concerns which may arise  X-rays indicate that the implant is in place and functioning well

## 2019-10-26 NOTE — Patient Instructions (Signed)

## 2019-11-11 ENCOUNTER — Encounter: Payer: Self-pay | Admitting: Family Medicine

## 2019-11-17 NOTE — Telephone Encounter (Signed)
Pt is calling in to check the status of the Rx ADDERALL stating that he is out and need it b/c he is heading going out of town tomorrow

## 2019-11-18 NOTE — Telephone Encounter (Signed)
Pt is scheduled for medication f/u and refill on Monday 11/21/2019

## 2019-11-21 ENCOUNTER — Telehealth (INDEPENDENT_AMBULATORY_CARE_PROVIDER_SITE_OTHER): Payer: Medicare Other | Admitting: Family Medicine

## 2019-11-21 ENCOUNTER — Other Ambulatory Visit: Payer: Self-pay

## 2019-11-21 ENCOUNTER — Encounter: Payer: Self-pay | Admitting: Family Medicine

## 2019-11-21 DIAGNOSIS — F902 Attention-deficit hyperactivity disorder, combined type: Secondary | ICD-10-CM

## 2019-11-21 DIAGNOSIS — R0683 Snoring: Secondary | ICD-10-CM | POA: Diagnosis not present

## 2019-11-21 MED ORDER — AMPHETAMINE-DEXTROAMPHETAMINE 20 MG PO TABS: 20.0000 mg | ORAL_TABLET | Freq: Two times a day (BID) | ORAL | 0 refills | Status: DC

## 2019-11-21 NOTE — Progress Notes (Signed)
Virtual Visit via Video Note  I connected with Michael Durham on 11/21/19 at  9:00 AM EST by a video enabled telemedicine application 2/2 [id number] pandemic and verified that I am speaking with the correct person using two identifiers.  Location patient: home Location provider:work or home office Persons participating in the virtual visit: patient, provider  I discussed the limitations of evaluation and management by telemedicine and the availability of in person appointments. The patient expressed understanding and agreed to proceed.   HPI: Pt is a 64 yo male with ADHD, anxiety, HLD, ED, bipolar 2 d/o, h/o vitamin D deficiency for follow-up.  Patient states he is doing well on Adderall 20 mg twice daily.  Patient notes weight is stable at 185 lbs.  Getting a little exercise.  Pt notes sleep is getting back to normal since the time change.  Taking a nap after lunch.  Pt snores, but does not wake himself up.  Pt wakes up in am feeling groggy.   Patient states energy is good.  Patient going on a trip to the [employer] in the next few weeks.  Had his "big toe joint replaced" 1 month ago, by Dr. Paulla Dolly.  Pt dropped a pair of sheers on his toe 15 yrs and had arthritis ever since.  Now doesn't have to soak his toe any more every day.  Pt went to see his 12 yo mother who has Alzheimer's in [place].  Pt states she is thin and not how he remembered her.    ROS: See pertinent positives and negatives per HPI.  Past Medical History:  Diagnosis Date  . ADHD   . Allergy   . Anxiety   . Bipolar 2 disorder ([place])   . Depression   . Diverticulosis   . ED (erectile dysfunction)   . Encephalitis, Age 53   . HLD (hyperlipidemia)   . Hx of colonic polyp - last colonoscopy with Dr. Alinda Sierras on 12/21/13 08/03/2018  . Vitamin D deficiency     Past Surgical History:  Procedure Laterality Date  . BRAIN SURGERY    . COLONOSCOPY    . MOUTH SURGERY      Family History  Problem Relation Age of Onset   . Prostate cancer Father   . Bladder Cancer Father   . Diabetes Brother   . Dementia Mother   . Breast cancer Maternal Aunt   . Colon cancer Neg Hx   . Esophageal cancer Neg Hx   . Stomach cancer Neg Hx   . Pancreatic cancer Neg Hx   . Inflammatory bowel disease Neg Hx   . Liver disease Neg Hx   . Rectal cancer Neg Hx      Current Outpatient Medications:  .  amphetamine-dextroamphetamine (ADDERALL) 20 MG tablet, Take 1 tablet (20 mg total) by mouth 2 (two) times daily., Disp: 60 tablet, Rfl: 0 .  amphetamine-dextroamphetamine (ADDERALL) 20 MG tablet, Take 1 tablet (20 mg total) by mouth 2 (two) times daily., Disp: 60 tablet, Rfl: 0 .  ARIPiprazole (ABILIFY) 5 MG tablet, TAKE 1 TABLET(5 MG) BY MOUTH AT BEDTIME, Disp: 90 tablet, Rfl: 3 .  atorvastatin (LIPITOR) 20 MG tablet, Take 1 tablet by mouth daily, Disp: 90 tablet, Rfl: 3 .  Cholecalciferol (VITAMIN D) 2000 units CAPS, Take 2,000 Units by mouth daily., Disp: , Rfl:  .  escitalopram (LEXAPRO) 20 MG tablet, TAKE 1 TABLET DAILY, Disp: 90 tablet, Rfl: 3 .  neomycin-polymyxin-hydrocortisone (CORTISPORIN) OTIC solution, 1-2 drops to toe twice  daily after soaking, Disp: 10 mL, Rfl: 0 .  oxyCODONE-acetaminophen (PERCOCET/ROXICET) 5-325 MG tablet, Take 1 tablet by mouth every 4 (four) hours as needed for severe pain., Disp: 30 tablet, Rfl: 0 .  tadalafil (CIALIS) 5 MG tablet, 1-3 tabs po daily as needed., Disp: 30 tablet, Rfl: 0  EXAM:  VITALS per patient if applicable: RR between 93-71 bpm, weight 185 LBS.  GENERAL: alert, oriented, appears well and in no acute distress  HEENT: atraumatic, conjunctiva clear, no obvious abnormalities on inspection of external nose and ears  NECK: normal movements of the head and neck  LUNGS: on inspection no signs of respiratory distress, breathing rate appears normal, no obvious gross SOB, gasping or wheezing  CV: no obvious cyanosis  MS: moves all visible extremities without noticeable  abnormality  PSYCH/NEURO: pleasant and cooperative, no obvious depression or anxiety, speech and thought processing grossly intact  ASSESSMENT AND PLAN:  Discussed the following assessment and plan:  Attention deficit hyperactivity disorder (ADHD), combined type -Stable -PDMP reviewed -Continue Adderall 20 mg twice daily - Plan: amphetamine-dextroamphetamine (ADDERALL) 20 MG tablet, amphetamine-dextroamphetamine (ADDERALL) 20 MG tablet, amphetamine-dextroamphetamine (ADDERALL) 20 MG tablet  Snoring -Patient snoring, wakes up groggy consider sleep study -Lifestyle modifications encouraged -Will continue to monitor   Follow-up in 3 months for ADHD.  Consider in person OFV for CPE and labs.  I discussed the assessment and treatment plan with the patient. The patient was provided an opportunity to ask questions and all were answered. The patient agreed with the plan and demonstrated an understanding of the instructions.   The patient was advised to call back or seek an in-person evaluation if the symptoms worsen or if the condition fails to improve as anticipated.   Billie Ruddy, MD

## 2020-02-10 NOTE — Progress Notes (Signed)
Subjective:   Michael Durham is a 65 y.o. male who presents for Medicare Annual/Subsequent preventive examination.  Review of Systems    N/A  Cardiac Risk Factors include: advanced age (>18men, >6 women);male gender;dyslipidemia     Objective:    Today's Vitals   02/13/20 0911  BP: 120/78  Pulse: 75  Temp: 97.6 F (36.4 C)  TempSrc: Oral  SpO2: 98%  Weight: 186 lb 9 oz (84.6 kg)  Height: 5\' 9"  (1.753 m)   Body mass index is 27.55 kg/m.  Advanced Directives 02/13/2020  Does Patient Have a Medical Advance Directive? Yes  Type of Paramedic of [place];Living will  Copy of [place] in Chart? Yes - validated most recent copy scanned in chart (See row information)    Current Medications (verified) Outpatient Encounter Medications as of 02/13/2020  Medication Sig  . amphetamine-dextroamphetamine (ADDERALL) 20 MG tablet Take 1 tablet (20 mg total) by mouth 2 (two) times daily.  Marland Kitchen amphetamine-dextroamphetamine (ADDERALL) 20 MG tablet Take 1 tablet (20 mg total) by mouth 2 (two) times daily.  Marland Kitchen amphetamine-dextroamphetamine (ADDERALL) 20 MG tablet Take 1 tablet (20 mg total) by mouth 2 (two) times daily.  . ARIPiprazole (ABILIFY) 5 MG tablet TAKE 1 TABLET(5 MG) BY MOUTH AT BEDTIME  . atorvastatin (LIPITOR) 20 MG tablet Take 1 tablet by mouth daily  . Cholecalciferol (VITAMIN D) 2000 units CAPS Take 2,000 Units by mouth daily.  Marland Kitchen escitalopram (LEXAPRO) 20 MG tablet TAKE 1 TABLET DAILY  . tadalafil (CIALIS) 5 MG tablet 1-3 tabs po daily as needed.  . neomycin-polymyxin-hydrocortisone (CORTISPORIN) OTIC solution 1-2 drops to toe twice daily after soaking (Patient not taking: Reported on 02/13/2020)  . [DISCONTINUED] oxyCODONE-acetaminophen (PERCOCET/ROXICET) 5-325 MG tablet Take 1 tablet by mouth every 4 (four) hours as needed for severe pain.   No facility-administered encounter medications on file as of 02/13/2020.    Allergies  (verified) Wellbutrin [bupropion] and Tetanus toxoids   History: Past Medical History:  Diagnosis Date  . ADHD   . Allergy   . Anxiety   . Bipolar 2 disorder ([place])   . Depression   . Diverticulosis   . ED (erectile dysfunction)   . Encephalitis, Age 74   . HLD (hyperlipidemia)   . Hx of colonic polyp - last colonoscopy with Dr. Alinda Sierras on 12/21/13 08/03/2018  . Vitamin D deficiency    Past Surgical History:  Procedure Laterality Date  . BRAIN SURGERY    . COLONOSCOPY    . MOUTH SURGERY     Family History  Problem Relation Age of Onset  . Prostate cancer Father   . Bladder Cancer Father   . Diabetes Brother   . Dementia Mother   . Breast cancer Maternal Aunt   . Colon cancer Neg Hx   . Esophageal cancer Neg Hx   . Stomach cancer Neg Hx   . Pancreatic cancer Neg Hx   . Inflammatory bowel disease Neg Hx   . Liver disease Neg Hx   . Rectal cancer Neg Hx    Social History   Socioeconomic History  . Marital status: Married    Spouse name: Not on file  . Number of children: Not on file  . Years of education: Not on file  . Highest education level: Not on file  Occupational History  . Not on file  Tobacco Use  . Smoking status: Former Research scientist (life sciences)  . Smokeless tobacco: Never Used  . Tobacco comment: Quit >  15 years ago, smoked 1-2 ppd for 30 years  Vaping Use  . Vaping Use: Never used  Substance and Sexual Activity  . Alcohol use: Yes    Comment: occasionally  . Drug use: No  . Sexual activity: Yes    Partners: Female    Birth control/protection: None  Other Topics Concern  . Not on file  Social History Narrative  . Not on file   Social Determinants of Health   Financial Resource Strain: Low Risk   . Difficulty of Paying Living Expenses: Not hard at all  Food Insecurity: No Food Insecurity  . Worried About Charity fundraiser in the Last Year: Never true  . Ran Out of Food in the Last Year: Never true  Transportation Needs: No Transportation Needs  . Lack of  Transportation (Medical): No  . Lack of Transportation (Non-Medical): No  Physical Activity: Inactive  . Days of Exercise per Week: 0 days  . Minutes of Exercise per Session: 0 min  Stress: No Stress Concern Present  . Feeling of Stress : Not at all  Social Connections: Moderately Integrated  . Frequency of Communication with Friends and Family: Twice a week  . Frequency of Social Gatherings with Friends and Family: Once a week  . Attends Religious Services: Never  . Active Member of Clubs or Organizations: Yes  . Attends Archivist Meetings: 1 to 4 times per year  . Marital Status: Married    Tobacco Counseling Counseling given: Not Answered Comment: Quit > 15 years ago, smoked 1-2 ppd for 30 years   Clinical Intake:  Pre-visit preparation completed: Yes  Pain : No/denies pain     Nutritional Risks: None Diabetes: No  How often do you need to have someone help you when you read instructions, pamphlets, or other written materials from your doctor or pharmacy?: 1 - Never  Diabetic? No  Interpreter Needed?: No  Information entered by :: [place] of Daily Living In your present state of health, do you have any difficulty performing the following activities: 02/13/2020  Hearing? N  Vision? N  Walking or climbing stairs? N  Dressing or bathing? N  Doing errands, shopping? N  Preparing Food and eating ? N  Using the Toilet? N  In the past six months, have you accidently leaked urine? N  Do you have problems with loss of bowel control? N  Managing your Medications? N  Managing your Finances? N  Housekeeping or managing your Housekeeping? N  Some recent data might be hidden    Patient Care Team: Billie Ruddy, MD as PCP - General (Family Medicine)  Indicate any recent Medical Services you may have received from other than [facility] providers in the past year (date may be approximate).     Assessment:   This is a routine wellness  examination for Michael Durham.  Hearing/Vision screen  Hearing Screening   125Hz  250Hz  500Hz  1000Hz  2000Hz  3000Hz  4000Hz  6000Hz  8000Hz   Right ear:           Left ear:           Vision Screening Comments: Patient states gets eyes examined once per year.Has glasses   Dietary issues and exercise activities discussed: Current Exercise Habits: The patient does not participate in regular exercise at present  Goals    . DIET - INCREASE WATER INTAKE    . Exercise 150 min/wk Moderate Activity    . Increase physical activity  Depression Screen PHQ 2/9 Scores 02/13/2020 11/25/2018 08/03/2018 06/23/2018 03/10/2018 04/06/2017 07/10/2016  PHQ - 2 Score 1 2 1  0 2 2 1   PHQ- 9 Score 5 7 5  0 6 10 2     Fall Risk Fall Risk  02/13/2020 08/03/2018 06/23/2018 03/10/2018 07/10/2016  Falls in the past year? 0 0 0 0 No  Number falls in past yr: 0 0 0 0 -  Injury with Fall? 0 0 0 0 -  Risk for fall due to : No Fall Risks - - - -  Follow up Falls evaluation completed;Falls prevention discussed - - - -    FALL RISK PREVENTION PERTAINING TO THE HOME:  Any stairs in or around the home? No  If so, are there any without handrails? No  Home free of loose throw rugs in walkways, pet beds, electrical cords, etc? Yes  Adequate lighting in your home to reduce risk of falls? Yes   ASSISTIVE DEVICES UTILIZED TO PREVENT FALLS:  Life alert? No  Use of a cane, walker or w/c? No  Grab bars in the bathroom? No  Shower chair or bench in shower? No  Elevated toilet seat or a handicapped toilet? No   TIMED UP AND GO:  Was the test performed? Yes .  Length of time to ambulate 10 feet: 3 sec.   Gait steady and fast without use of assistive device  Cognitive Function:   Normal cognitive status assessed by direct observation by this Nurse Health Advisor. No abnormalities found.        Immunizations Immunization History  Administered Date(s) Administered  . Fluad Quad(high Dose 65+) 10/14/2018  . Influenza Inj Mdck Quad Pf  12/07/2015  . Influenza Whole 10/20/2017  . Influenza, Seasonal, Injecte, Preservative Fre 10/14/2012  . Influenza,inj,Quad PF,6+ Mos 10/20/2013, 10/26/2014, 11/12/2016  . Influenza,inj,quad, With Preservative 11/19/2017  . Influenza-Unspecified 12/07/2015, 10/07/2019  . PFIZER(Purple Top)SARS-COV-2 Vaccination 02/16/2019, 03/09/2019  . Pneumococcal Conjugate-13 10/26/2014  . Pneumococcal Polysaccharide-23 08/03/2018  . Tdap 01/06/2006, 12/07/2015  . Zoster 10/20/2013    TDAP status: Up to date  Flu Vaccine status: Up to date  Pneumococcal vaccine status: Up to date  Covid-19 vaccine status: Completed vaccines  Qualifies for Shingles Vaccine? Yes   Zostavax completed Yes   Shingrix Completed?: No.    Education has been provided regarding the importance of this vaccine. Patient has been advised to call insurance company to determine out of pocket expense if they have not yet received this vaccine. Advised may also receive vaccine at local pharmacy or Health Dept. Verbalized acceptance and understanding.  Screening Tests Health Maintenance  Topic Date Due  . COVID-19 Vaccine (3 - Booster for Pfizer series) 09/09/2019  . COLONOSCOPY (Pts 45-63yrs Insurance coverage will need to be confirmed)  06/13/2020  . TETANUS/TDAP  12/06/2025  . INFLUENZA VACCINE  Completed  . Hepatitis C Screening  Completed  . PNA vac Low Risk Adult  Completed    Health Maintenance  Health Maintenance Due  Topic Date Due  . COVID-19 Vaccine (3 - Booster for Pfizer series) 09/09/2019    Colorectal cancer screening: Type of screening: Colonoscopy. Completed 06/14/2019. Repeat every 1 years  Lung Cancer Screening: (Low Dose CT Chest recommended if Age 3-80 years, 30 pack-year currently smoking OR have quit w/in 15years.) does not qualify.   Lung Cancer Screening Referral: N/A   Additional Screening:  Hepatitis C Screening: does qualify; Completed 02/13/2017  Vision Screening: Recommended annual  ophthalmology exams for early detection of glaucoma and  other disorders of the eye. Is the patient up to date with their annual eye exam?  Yes  Who is the provider or what is the name of the office in which the patient attends annual eye exams? Dr. Leighton Roach If pt is not established with a provider, would they like to be referred to a provider to establish care? No .   Dental Screening: Recommended annual dental exams for proper oral hygiene  Community Resource Referral / Chronic Care Management: CRR required this visit?  No   CCM required this visit?  No      Plan:     I have personally reviewed and noted the following in the patient's chart:   . Medical and social history . Use of alcohol, tobacco or illicit drugs  . Current medications and supplements . Functional ability and status . Nutritional status . Physical activity . Advanced directives . List of other physicians . Hospitalizations, surgeries, and ER visits in previous 12 months . Vitals . Screenings to include cognitive, depression, and falls . Referrals and appointments  In addition, I have reviewed and discussed with patient certain preventive protocols, quality metrics, and best practice recommendations. A written personalized care plan for preventive services as well as general preventive health recommendations were provided to patient.     Ofilia Neas, LPN   [id number]   Nurse Notes: None

## 2020-02-13 ENCOUNTER — Ambulatory Visit (INDEPENDENT_AMBULATORY_CARE_PROVIDER_SITE_OTHER): Payer: Medicare Other

## 2020-02-13 ENCOUNTER — Other Ambulatory Visit: Payer: Self-pay

## 2020-02-13 VITALS — BP 120/78 | HR 75 | Temp 97.6°F | Ht 69.0 in | Wt 186.6 lb

## 2020-02-13 DIAGNOSIS — Z0000 Encounter for general adult medical examination without abnormal findings: Secondary | ICD-10-CM

## 2020-02-13 NOTE — Patient Instructions (Signed)
Michael Durham , Thank you for taking time to come for your Medicare Wellness Visit. I appreciate your ongoing commitment to your health goals. Please review the following plan we discussed and let me know if I can assist you in the future.   Screening recommendations/referrals: Colonoscopy: Up to date, next due 06/13/2020 Recommended yearly ophthalmology/optometry visit for glaucoma screening and checkup Recommended yearly dental visit for hygiene and checkup  Vaccinations: Influenza vaccine: Up to date, next due fall 2022  Pneumococcal vaccine: Completed series  Tdap vaccine: Up to date, next due 12/06/2025 Shingles vaccine: Currently due for Shingirx, if you wish to receive we recommend that you do so at your local pharmacy.     Advanced directives: Copies on file   Conditions/risks identified: none   Next appointment: None   Preventive Care 65 Years and Older, Male Preventive care refers to lifestyle choices and visits with your health care provider that can promote health and wellness. What does preventive care include?  A yearly physical exam. This is also called an annual well check.  Dental exams once or twice a year.  Routine eye exams. Ask your health care provider how often you should have your eyes checked.  Personal lifestyle choices, including:  Daily care of your teeth and gums.  Regular physical activity.  Eating a healthy diet.  Avoiding tobacco and drug use.  Limiting alcohol use.  Practicing safe sex.  Taking low doses of aspirin every day.  Taking vitamin and mineral supplements as recommended by your health care provider. What happens during an annual well check? The services and screenings done by your health care provider during your annual well check will depend on your age, overall health, lifestyle risk factors, and family history of disease. Counseling  Your health care provider may ask you questions about your:  Alcohol use.  Tobacco  use.  Drug use.  Emotional well-being.  Home and relationship well-being.  Sexual activity.  Eating habits.  History of falls.  Memory and ability to understand (cognition).  Work and work Statistician. Screening  You may have the following tests or measurements:  Height, weight, and BMI.  Blood pressure.  Lipid and cholesterol levels. These may be checked every 5 years, or more frequently if you are over 26 years old.  Skin check.  Lung cancer screening. You may have this screening every year starting at age 61 if you have a 30-pack-year history of smoking and currently smoke or have quit within the past 15 years.  Fecal occult blood test (FOBT) of the stool. You may have this test every year starting at age 88.  Flexible sigmoidoscopy or colonoscopy. You may have a sigmoidoscopy every 5 years or a colonoscopy every 10 years starting at age 33.  Prostate cancer screening. Recommendations will vary depending on your family history and other risks.  Hepatitis C blood test.  Hepatitis B blood test.  Sexually transmitted disease (STD) testing.  Diabetes screening. This is done by checking your blood sugar (glucose) after you have not eaten for a while (fasting). You may have this done every 1-3 years.  Abdominal aortic aneurysm (AAA) screening. You may need this if you are a current or former smoker.  Osteoporosis. You may be screened starting at age 45 if you are at high risk. Talk with your health care provider about your test results, treatment options, and if necessary, the need for more tests. Vaccines  Your health care provider may recommend certain vaccines, such as:  Influenza vaccine. This is recommended every year.  Tetanus, diphtheria, and acellular pertussis (Tdap, Td) vaccine. You may need a Td booster every 10 years.  Zoster vaccine. You may need this after age 17.  Pneumococcal 13-valent conjugate (PCV13) vaccine. One dose is recommended after age  66.  Pneumococcal polysaccharide (PPSV23) vaccine. One dose is recommended after age 72. Talk to your health care provider about which screenings and vaccines you need and how often you need them. This information is not intended to replace advice given to you by your health care provider. Make sure you discuss any questions you have with your health care provider. Document Released: 01/19/2015 Document Revised: 09/12/2015 Document Reviewed: 10/24/2014 Elsevier Interactive Patient Education  2017 [place] Prevention in the Home Falls can cause injuries. They can happen to people of all ages. There are many things you can do to make your home safe and to help prevent falls. What can I do on the outside of my home?  Regularly fix the edges of walkways and driveways and fix any cracks.  Remove anything that might make you trip as you walk through a door, such as a raised step or threshold.  Trim any bushes or trees on the path to your home.  Use bright outdoor lighting.  Clear any walking paths of anything that might make someone trip, such as rocks or tools.  Regularly check to see if handrails are loose or broken. Make sure that both sides of any steps have handrails.  Any raised decks and porches should have guardrails on the edges.  Have any leaves, snow, or ice cleared regularly.  Use sand or salt on walking paths during winter.  Clean up any spills in your garage right away. This includes oil or grease spills. What can I do in the bathroom?  Use night lights.  Install grab bars by the toilet and in the tub and shower. Do not use towel bars as grab bars.  Use non-skid mats or decals in the tub or shower.  If you need to sit down in the shower, use a plastic, non-slip stool.  Keep the floor dry. Clean up any water that spills on the floor as soon as it happens.  Remove soap buildup in the tub or shower regularly.  Attach bath mats securely with double-sided  non-slip rug tape.  Do not have throw rugs and other things on the floor that can make you trip. What can I do in the bedroom?  Use night lights.  Make sure that you have a light by your bed that is easy to reach.  Do not use any sheets or blankets that are too big for your bed. They should not hang down onto the floor.  Have a firm chair that has side arms. You can use this for support while you get dressed.  Do not have throw rugs and other things on the floor that can make you trip. What can I do in the kitchen?  Clean up any spills right away.  Avoid walking on wet floors.  Keep items that you use a lot in easy-to-reach places.  If you need to reach something above you, use a strong step stool that has a grab bar.  Keep electrical cords out of the way.  Do not use floor polish or wax that makes floors slippery. If you must use wax, use non-skid floor wax.  Do not have throw rugs and other things on the floor that  can make you trip. What can I do with my stairs?  Do not leave any items on the stairs.  Make sure that there are handrails on both sides of the stairs and use them. Fix handrails that are broken or loose. Make sure that handrails are as long as the stairways.  Check any carpeting to make sure that it is firmly attached to the stairs. Fix any carpet that is loose or worn.  Avoid having throw rugs at the top or bottom of the stairs. If you do have throw rugs, attach them to the floor with carpet tape.  Make sure that you have a light switch at the top of the stairs and the bottom of the stairs. If you do not have them, ask someone to add them for you. What else can I do to help prevent falls?  Wear shoes that:  Do not have high heels.  Have rubber bottoms.  Are comfortable and fit you well.  Are closed at the toe. Do not wear sandals.  If you use a stepladder:  Make sure that it is fully opened. Do not climb a closed stepladder.  Make sure that both  sides of the stepladder are locked into place.  Ask someone to hold it for you, if possible.  Clearly mark and make sure that you can see:  Any grab bars or handrails.  First and last steps.  Where the edge of each step is.  Use tools that help you move around (mobility aids) if they are needed. These include:  Canes.  Walkers.  Scooters.  Crutches.  Turn on the lights when you go into a dark area. Replace any light bulbs as soon as they burn out.  Set up your furniture so you have a clear path. Avoid moving your furniture around.  If any of your floors are uneven, fix them.  If there are any pets around you, be aware of where they are.  Review your medicines with your doctor. Some medicines can make you feel dizzy. This can increase your chance of falling. Ask your doctor what other things that you can do to help prevent falls. This information is not intended to replace advice given to you by your health care provider. Make sure you discuss any questions you have with your health care provider. Document Released: 10/19/2008 Document Revised: 05/31/2015 Document Reviewed: 01/27/2014 Elsevier Interactive Patient Education  2017 [employer].

## 2020-02-17 ENCOUNTER — Emergency Department (HOSPITAL_COMMUNITY)
Admission: EM | Admit: 2020-02-17 | Discharge: 2020-02-17 | Disposition: A | Discharge status: Home / Self Care | Payer: Medicare Other | Attending: Emergency Medicine | Admitting: Emergency Medicine

## 2020-02-17 ENCOUNTER — Telehealth: Payer: Medicare Other | Admitting: Family Medicine

## 2020-02-17 ENCOUNTER — Emergency Department (HOSPITAL_COMMUNITY): Payer: Medicare Other

## 2020-02-17 ENCOUNTER — Encounter (HOSPITAL_COMMUNITY): Payer: Self-pay

## 2020-02-17 ENCOUNTER — Other Ambulatory Visit: Payer: Self-pay

## 2020-02-17 DIAGNOSIS — R079 Chest pain, unspecified: Secondary | ICD-10-CM

## 2020-02-17 DIAGNOSIS — R0789 Other chest pain: Secondary | ICD-10-CM | POA: Diagnosis present

## 2020-02-17 DIAGNOSIS — R091 Pleurisy: Secondary | ICD-10-CM | POA: Diagnosis not present

## 2020-02-17 DIAGNOSIS — Z87891 Personal history of nicotine dependence: Secondary | ICD-10-CM | POA: Diagnosis not present

## 2020-02-17 LAB — CBC
HCT: 46.4 % (ref 39.0–52.0)
Hemoglobin: 15.4 g/dL (ref 13.0–17.0)
MCH: 30.6 pg (ref 26.0–34.0)
MCHC: 33.2 g/dL (ref 30.0–36.0)
MCV: 92.1 fL (ref 80.0–100.0)
Platelets: 286 10*3/uL (ref 150–400)
RBC: 5.04 MIL/uL (ref 4.22–5.81)
RDW: 13.2 % (ref 11.5–15.5)
WBC: 8.2 10*3/uL (ref 4.0–10.5)
nRBC: 0 % (ref 0.0–0.2)

## 2020-02-17 LAB — BASIC METABOLIC PANEL
Anion gap: 9 (ref 5–15)
BUN: 11 mg/dL (ref 8–23)
CO2: 29 mmol/L (ref 22–32)
Calcium: 9.5 mg/dL (ref 8.9–10.3)
Chloride: 100 mmol/L (ref 98–111)
Creatinine, Ser: 1 mg/dL (ref 0.61–1.24)
GFR, Estimated: >60 mL/min (ref >60)
Glucose, Bld: 104 mg/dL — ABNORMAL HIGH (ref 70–99)
Potassium: 5.2 mmol/L — ABNORMAL HIGH (ref 3.5–5.1)
Sodium: 138 mmol/L (ref 135–145)

## 2020-02-17 LAB — TROPONIN I (HIGH SENSITIVITY)
Troponin I (High Sensitivity): 4 ng/L (ref <18)
Troponin I (High Sensitivity): 3 ng/L (ref <18)

## 2020-02-17 LAB — D-DIMER, QUANTITATIVE: D-Dimer, Quant: 0.99 ug/mL-FEU — ABNORMAL HIGH (ref 0.00–0.50)

## 2020-02-17 LAB — CK: Total CK: 131 U/L (ref 49–397)

## 2020-02-17 MED ORDER — IOHEXOL 350 MG/ML SOLN
100.0000 mL | Freq: Once | INTRAVENOUS | Status: AC | PRN
  Administered 2020-02-17: 100 mL via INTRAVENOUS

## 2020-02-17 NOTE — ED Triage Notes (Signed)
Pt arrived via walk in, c/o left shoulder pain, and left sided chest pain that started Wednesday. Reproducible with deep breathing.

## 2020-02-17 NOTE — Discharge Instructions (Signed)

## 2020-02-17 NOTE — ED Notes (Signed)
Pt walked to XRAY after bloodwork drawn.

## 2020-02-17 NOTE — ED Provider Notes (Signed)
[facility] DEPT Provider Note   CSN: [id number] Arrival date & time: 02/17/20  1007     History Chief Complaint  Patient presents with  . Chest Pain    Michael Durham is a 65 y.o. male.  HPI     65 year old male comes in with chief complaint of chest pain.  Patient has history of bipolar disorder, anxiety, depression, hyperlipidemia.  Patient reports that he started having left-sided shoulder pain 3 days ago.  There was no specific provoking region.  Denies any trauma, heavy lifting.  Patient is describing the pain as dull and sharp, constant and severe at its peak.  There is no specific aggravating factor with the pain.  Soon after the left-sided shoulder pains he started noticing chest pain.  Chest pain is in the left lower thoracic region.  Patient describes that pain as sharpness as well which is constant.  The pain is not positional.  He does not have any shortness of breath, but does indicate during exam that inspiration makes it slightly worse.  No history of similar symptoms in the past.  Patient denies any cardiac work-up.  He denies any current smoking or drug use.  Past Medical History:  Diagnosis Date  . ADHD   . Allergy   . Anxiety   . Bipolar 2 disorder ([place])   . Depression   . Diverticulosis   . ED (erectile dysfunction)   . Encephalitis, Age 13   . HLD (hyperlipidemia)   . Hx of colonic polyp - last colonoscopy with Dr. Alinda Sierras on 12/21/13 08/03/2018  . Vitamin D deficiency     Patient Active Problem List   Diagnosis Date Noted  . Colon cancer screening 04/09/2019  . Hx of colonic polyps 04/09/2019  . Diverticulosis 08/03/2018  . Hx of colonic polyp - last colonoscopy with Dr. Alinda Sierras on 12/21/13 08/03/2018  . Vitamin D deficiency 11/12/2016  . Recurrent major depressive episodes, moderate ([place]) 11/12/2016  . HLD (hyperlipidemia)   . ADHD   . ED (erectile dysfunction)   . Bipolar 2 disorder [facility])     Past Surgical  History:  Procedure Laterality Date  . BRAIN SURGERY    . COLONOSCOPY    . MOUTH SURGERY         Family History  Problem Relation Age of Onset  . Prostate cancer Father   . Bladder Cancer Father   . Diabetes Brother   . Dementia Mother   . Breast cancer Maternal Aunt   . Colon cancer Neg Hx   . Esophageal cancer Neg Hx   . Stomach cancer Neg Hx   . Pancreatic cancer Neg Hx   . Inflammatory bowel disease Neg Hx   . Liver disease Neg Hx   . Rectal cancer Neg Hx     Social History   Tobacco Use  . Smoking status: Former Research scientist (life sciences)  . Smokeless tobacco: Never Used  . Tobacco comment: Quit > 15 years ago, smoked 1-2 ppd for 30 years  Vaping Use  . Vaping Use: Never used  Substance Use Topics  . Alcohol use: Yes    Comment: occasionally  . Drug use: No    Home Medications Prior to Admission medications   Medication Sig Start Date End Date Taking? Authorizing Provider  amphetamine-dextroamphetamine (ADDERALL) 20 MG tablet Take 1 tablet (20 mg total) by mouth 2 (two) times daily. 11/21/19   Billie Ruddy, MD  amphetamine-dextroamphetamine (ADDERALL) 20 MG tablet Take 1 tablet (20 mg  total) by mouth 2 (two) times daily. 11/21/19   Billie Ruddy, MD  amphetamine-dextroamphetamine (ADDERALL) 20 MG tablet Take 1 tablet (20 mg total) by mouth 2 (two) times daily. 11/21/19   Billie Ruddy, MD  ARIPiprazole (ABILIFY) 5 MG tablet TAKE 1 TABLET(5 MG) BY MOUTH AT BEDTIME 04/13/19   Billie Ruddy, MD  atorvastatin (LIPITOR) 20 MG tablet Take 1 tablet by mouth daily 01/25/19   Billie Ruddy, MD  Cholecalciferol (VITAMIN D) 2000 units CAPS Take 2,000 Units by mouth daily.    [provider]  escitalopram (LEXAPRO) 20 MG tablet TAKE 1 TABLET DAILY 04/13/19   Billie Ruddy, MD  neomycin-polymyxin-hydrocortisone (CORTISPORIN) OTIC solution 1-2 drops to toe twice daily after soaking Patient not taking: Reported on 02/13/2020 10/26/19   Wallene Huh, DPM  tadalafil  (CIALIS) 5 MG tablet 1-3 tabs po daily as needed. 01/18/19   Billie Ruddy, MD    Allergies    Wellbutrin [bupropion] and Tetanus toxoids  Review of Systems   Review of Systems  Constitutional: Positive for activity change.  Respiratory: Negative for chest tightness and shortness of breath.   Cardiovascular: Positive for chest pain.  Gastrointestinal: Negative for nausea.  Musculoskeletal: Positive for arthralgias and myalgias.  Allergic/Immunologic: Negative for immunocompromised state.  Hematological: Does not bruise/bleed easily.  All other systems reviewed and are negative.   Physical Exam Updated Vital Signs BP (!) 150/86 (BP Location: Right Arm)   Pulse (!) 58   Temp 97.6 F (36.4 C) (Oral)   Resp 19   Ht 5\' 9"  (1.753 m)   Wt 83.9 kg   SpO2 98%   BMI 27.32 kg/m   Physical Exam Vitals and nursing note reviewed.  Constitutional:      Appearance: He is well-developed.  HENT:     Head: Atraumatic.  Cardiovascular:     Rate and Rhythm: Normal rate.  Pulmonary:     Effort: Pulmonary effort is normal.  Musculoskeletal:     Cervical back: Neck supple.  Skin:    General: Skin is warm.  Neurological:     Mental Status: He is alert and oriented to person, place, and time.     ED Results / Procedures / Treatments   Labs (all labs ordered are listed, but only abnormal results are displayed) Labs Reviewed  BASIC METABOLIC PANEL - Abnormal; Notable for the following components:      Result Value   Potassium 5.2 (*)    Glucose, Bld 104 (*)    All other components within normal limits  D-DIMER, QUANTITATIVE (NOT AT [facility]) - Abnormal; Notable for the following components:   D-Dimer, Quant 0.99 (*)    All other components within normal limits  CBC  CK  TROPONIN I (HIGH SENSITIVITY)  TROPONIN I (HIGH SENSITIVITY)    EKG EKG Interpretation  Date/Time:  Friday February 17 2020 10:16:00 EST Ventricular Rate:  70 PR Interval:    QRS Duration: 113 QT  Interval:  390 QTC Calculation: 421 R Axis:   65 Text Interpretation: Sinus rhythm Incomplete right bundle branch block No old tracing to compare Reconfirmed by Varney Biles [phone]) on 02/17/2020 3:04:17 PM   Radiology DG Chest 2 View  Result Date: 02/17/2020 CLINICAL DATA:  65 year old male with left side chest pain for 2 days radiating to the left shoulder. EXAM: CHEST - 2 VIEW COMPARISON:  None. FINDINGS: Lung volumes and mediastinal contours are within normal limits. Visualized tracheal air column is within normal  limits. EKG button artifact in the right upper lung. No pneumothorax, pulmonary edema, pleural effusion or confluent pulmonary opacity. Flowing endplate osteophytes in the thoracic spine. No acute osseous abnormality identified. Negative visible bowel gas pattern. IMPRESSION: Negative.  No acute cardiopulmonary abnormality. Electronically Signed   By: Genevie Ann M.D.   On: 02/17/2020 10:50   CT Angio Chest PE W and/or Wo Contrast  Result Date: 02/17/2020 CLINICAL DATA:  Chest pain. EXAM: CT ANGIOGRAPHY CHEST WITH CONTRAST TECHNIQUE: Multidetector CT imaging of the chest was performed using the standard protocol during bolus administration of intravenous contrast. Multiplanar CT image reconstructions and MIPs were obtained to evaluate the vascular anatomy. CONTRAST:  147mL OMNIPAQUE IOHEXOL 350 MG/ML SOLN COMPARISON:  Current chest radiograph FINDINGS: Cardiovascular: Pulmonary arteries well opacified. There is no evidence of a pulmonary embolism. Heart is normal in size and configuration. No pericardial effusion. Minor left coronary artery calcification. Great vessels are normal in caliber. No aortic dissection or significant atherosclerosis. Mediastinum/Nodes: No enlarged mediastinal, hilar, or axillary lymph nodes. Thyroid gland, trachea, and esophagus demonstrate no significant findings. Lungs/Pleura: Mild linear type opacities at the lung bases consistent with atelectasis and/or  scarring. Lungs otherwise clear. No pleural effusion or pneumothorax. Upper Abdomen: No acute findings. Low-density liver masses consistent with cysts. Single low-density mass from the medial upper pole of the left kidney, also consistent with a cyst. Musculoskeletal: No fracture or acute finding.  No bone lesion. Review of the MIP images confirms the above findings. IMPRESSION: 1. No evidence of a pulmonary embolism or aortic dissection. 2. No acute findings. 3. Minor left coronary artery calcification. Electronically Signed   By: Lajean Manes M.D.   On: 02/17/2020 19:36    Procedures Procedures   Medications Ordered in ED Medications  iohexol (OMNIPAQUE) 350 MG/ML injection 100 mL (100 mLs Intravenous Contrast Given 02/17/20 1916)    ED Course  I have reviewed the triage vital signs and the nursing notes.  Pertinent labs & imaging results that were available during my care of the patient were reviewed by me and considered in my medical decision making (see chart for details).    MDM Rules/Calculators/A&P                          65 year old male comes in a chief complaint of left-sided chest pain and shoulder pain.  The symptoms have been present for the last 3 days.  There is no specific evoking, aggravating or relieving factors.  There is waxing and waning intensity of the pain.  It does appear that there is pleuritic component to the chest pain.  Patient denies any COVID-19 exposures, recent vaccination, recent illness of any kind.  He has no cough, fevers, chills.  Pain in his shoulder has been constant and there has been no radiation of pain beyond the shoulder location.  Her exam is overall reassuring from vascular perspective and even musculoskeletal perspective.  Patient's neuro exam in the left upper extremity along with skin exam is also normal.  Troponin x2 ordered.  Hear score is 5, but clinically the pretest probability for ACS was actually quite low.  With the tropes into 2  being negative, will advise outpatient follow-up if the PE work-up, which we will also concurrently begin is negative.  Reassessment: D-dimer is slightly elevated.  CT angiogram ordered and it reveals no evidence of PE, pneumonia.  Structural evaluation of the thoracic region and organs is also reassuring.  At this  time, will discharge the patient and request outpatient follow-up.  Strict ER return precautions have also been discussed. Final Clinical Impression(s) / ED Diagnoses Final diagnoses:  Pleurisy  Chest pain, unspecified type    Rx / DC Orders ED Discharge Orders    None       Varney Biles, MD 02/18/20 1324

## 2020-02-22 ENCOUNTER — Ambulatory Visit (INDEPENDENT_AMBULATORY_CARE_PROVIDER_SITE_OTHER): Payer: Medicare Other

## 2020-02-22 ENCOUNTER — Encounter: Payer: Self-pay | Admitting: Family Medicine

## 2020-02-22 ENCOUNTER — Ambulatory Visit (INDEPENDENT_AMBULATORY_CARE_PROVIDER_SITE_OTHER): Payer: Medicare Other | Admitting: Family Medicine

## 2020-02-22 ENCOUNTER — Other Ambulatory Visit: Payer: Self-pay

## 2020-02-22 VITALS — BP 146/88 | HR 71 | Temp 98.1°F | Ht 69.0 in | Wt 188.0 lb

## 2020-02-22 DIAGNOSIS — M25512 Pain in left shoulder: Secondary | ICD-10-CM

## 2020-02-22 DIAGNOSIS — E875 Hyperkalemia: Secondary | ICD-10-CM | POA: Diagnosis not present

## 2020-02-22 DIAGNOSIS — G8929 Other chronic pain: Secondary | ICD-10-CM

## 2020-02-22 DIAGNOSIS — M25511 Pain in right shoulder: Secondary | ICD-10-CM

## 2020-02-22 DIAGNOSIS — I2510 Atherosclerotic heart disease of native coronary artery without angina pectoris: Secondary | ICD-10-CM | POA: Diagnosis not present

## 2020-02-22 DIAGNOSIS — R091 Pleurisy: Secondary | ICD-10-CM

## 2020-02-22 DIAGNOSIS — E782 Mixed hyperlipidemia: Secondary | ICD-10-CM

## 2020-02-22 DIAGNOSIS — F902 Attention-deficit hyperactivity disorder, combined type: Secondary | ICD-10-CM

## 2020-02-22 DIAGNOSIS — R030 Elevated blood-pressure reading, without diagnosis of hypertension: Secondary | ICD-10-CM

## 2020-02-22 LAB — LIPID PANEL
Cholesterol: 136 mg/dL (ref 0–200)
HDL: 45.9 mg/dL (ref >39.00)
LDL Cholesterol: 68 mg/dL (ref 0–99)
NonHDL: 90.39
Total CHOL/HDL Ratio: 3
Triglycerides: 112 mg/dL (ref 0.0–149.0)
VLDL: 22.4 mg/dL (ref 0.0–40.0)

## 2020-02-22 LAB — BASIC METABOLIC PANEL
BUN: 12 mg/dL (ref 6–23)
CO2: 31 mEq/L (ref 19–32)
Calcium: 9.7 mg/dL (ref 8.4–10.5)
Chloride: 99 mEq/L (ref 96–112)
Creatinine, Ser: 1.03 mg/dL (ref 0.40–1.50)
GFR: 75.65 mL/min (ref >60.00)
Glucose, Bld: 65 mg/dL — ABNORMAL LOW (ref 70–99)
Potassium: 4.8 mEq/L (ref 3.5–5.1)
Sodium: 135 mEq/L (ref 135–145)

## 2020-02-22 NOTE — Progress Notes (Signed)
Subjective:    Patient ID: Michael Durham, male    DOB: 07-26-1955, 65 y.o.   MRN: [id number]  Chief Complaint  Patient presents with  . Follow-up    ER follow up    HPI Patient is a 65 year old male with past medical history significant for bipolar disorder, anxiety, depression, HLD who was seen today for ED follow-up.  Patient seen in ED on 02/17/2020 for CP 2/2 pleurisy.  Patient had dull, sharp, constant left-sided shoulder pain.  EKG with sinus rhythm and incomplete RBBB.  D-dimer mildly elevated.  CT angio ordered, a hepatic cyst noted and opacities in lung bases consistent with atelectasis or scarring, and mild left coronary artery calcifications.    Patient without pain in chest since being discharged from the ED.  Pt does not recall any heavy lifting, pushing pulling, or changes in regular routine.  Pt notes ongoing bilateral shoulder pain worse at night when laying on side.  Patient taking Adderall 20 mg twice daily for ADHD.  Did not take med yet this am.  Pt does not need a refill yet.   Past Medical History:  Diagnosis Date  . ADHD   . Allergy   . Anxiety   . Bipolar 2 disorder ([place])   . Depression   . Diverticulosis   . ED (erectile dysfunction)   . Encephalitis, Age 76   . HLD (hyperlipidemia)   . Hx of colonic polyp - last colonoscopy with Dr. Alinda Sierras on 12/21/13 08/03/2018  . Vitamin D deficiency     Allergies  Allergen Reactions  . Wellbutrin [Bupropion] Other (See Comments)    Severe headache.  . Tetanus Toxoids     "old horse serum preparation"    ROS General: Denies fever, chills, night sweats, changes in weight, changes in appetite HEENT: Denies headaches, ear pain, changes in vision, rhinorrhea, sore throat CV: Denies CP, palpitations, SOB, orthopnea Pulm: Denies SOB, cough, wheezing GI: Denies abdominal pain, nausea, vomiting, diarrhea, constipation GU: Denies dysuria, hematuria, frequency Msk: Denies muscle cramps, joint pains  +b/l shoulder  pain Neuro: Denies weakness, numbness, tingling Skin: Denies rashes, bruising Psych: Denies depression, anxiety, hallucinations     Objective:    Blood pressure (!) 146/88, pulse 71, temperature 98.1 F (36.7 C), temperature source Oral, height 5\' 9"  (1.753 m), weight 188 lb (85.3 kg), SpO2 99 %.  BP recheck 146/88  Gen. Pleasant, well-nourished, in no distress, normal affect   HEENT: [place]/AT, face symmetric, conjunctiva clear, no scleral icterus, PERRLA, EOMI, nares patent without drainage Lungs: no accessory muscle use, CTAB, no wheezes or rales Cardiovascular: RRR, no m/r/g, no peripheral edema Musculoskeletal: TTP of b/l AC joints.  No deformities, no cyanosis or clubbing, normal tone Neuro:  A&Ox3, CN II-XII intact, normal gait Skin:  Warm, no lesions/ rash   Wt Readings from Last 3 Encounters:  02/22/20 188 lb (85.3 kg)  02/17/20 185 lb (83.9 kg)  02/13/20 186 lb 9 oz (84.6 kg)    Lab Results  Component Value Date   WBC 8.2 02/17/2020   HGB 15.4 02/17/2020   HCT 46.4 02/17/2020   PLT 286 02/17/2020   GLUCOSE 104 (H) 02/17/2020   CHOL 121 08/03/2018   TRIG 66.0 08/03/2018   HDL 50.70 08/03/2018   LDLCALC 57 08/03/2018   ALT 32 08/03/2018   AST 21 08/03/2018   NA 138 02/17/2020   K 5.2 (H) 02/17/2020   CL 100 02/17/2020   CREATININE 1.00 02/17/2020   BUN 11 02/17/2020  CO2 29 02/17/2020   TSH 2.06 03/10/2018   HGBA1C 5.9 08/03/2018    Assessment/Plan:  Pleurisy -stable -CTA 02/17/20 with atelectasis and/or scarring in lung bases.  Low-density liver masses consistent with cyst.  Cyst in left kidney medial upper pole.  Mild left coronary artery calcification.  No evidence of pleural embolism or aortic dissection. -Follow-up with cardiology recommended per ED visit.  Patient to call to schedule appointment -Continue supportive care -Continue to monitor  Elevated blood pressure reading without diagnosis of hypertension  -BP elevated -Recheck  146/88 -Discussed obtaining BP cuff to monitor at home.  For continued elevation will start medication -Lifestyle modifications encouraged -Follow-up in 1 month, sooner if needed - Plan: Basic metabolic panel  Chronic pain of both shoulders  -Likely 2/2 AC joint arthritis -We will obtain imaging -Discussed supportive care options - Plan: DG Shoulder Left, DG Shoulder Right  Hyperkalemia -Potassium 5.2 on 02/17/2020 in ED - Plan: Basic metabolic panel  Mixed hyperlipidemia -Continue lifestyle modifications -Continue Lipitor 20 mg daily - Plan: Lipid panel  Attention deficit hyperactivity disorder (ADHD), combined type Stable -Continue Adderall 20 mg -Patient will likely need refill in the next few weeks.  Advised to notify clinic so refill(s) can be sent.  F/u in 1 month for BP check and follow-up on ADHD  Grier Mitts, MD

## 2020-02-22 NOTE — Patient Instructions (Signed)
Pleurisy  Pleurisy is irritation and swelling of the linings of your lungs. This can cause pain in your chest, back, or shoulder. It can also cause trouble breathing. What are the causes?  A lung infection.  A blood clot that travels to the lungs.  Injury to the chest.  Lung cancer or a lung tumor.  Heart or chest surgery.  Lung damage from inhaling asbestos.  Certain medicines or treatment for cancer.  Diseases that can cause irritation and swelling of the lungs. Sometimes, the cause is not known. What are the signs or symptoms? The main symptom is chest pain, usually on one side. The pain starts suddenly and can be sharp, stabbing, or dull. It may get worse when you cough or breathe deep. Other symptoms include:  Trouble breathing.  Noisy breathing.  Cough.  Chills.  Fever.  Coughing up blood. Symptoms can be worse when you are lying down or lying to one side. How is this treated? Treatment depends on the cause. It may include:  Medicines to help with swelling.  Antibiotic medicines, if your condition was caused by an infection by bacteria.  Pain medicine.  Cough medicine.  Blood thinners, if your condition was caused by a blood clot.  Taking the fluid or air out of your lungs with a tube. Follow these instructions at home: Medicines  Take over-the-counter and prescription medicines only as told by your doctor.  Take your antibiotic medicine as told by your doctor. Do not stop taking the antibiotic even if you start to feel better.  If you were prescribed medicines to remove extra fluid from your lungs (diuretics), take them as told by your doctor.  If you were prescribed blood thinners, take them exactly as prescribed. This is important. Activity  Rest and return to your normal activities as told by your doctor. Ask your doctor what activities are safe for you.  Ask your health care provider if the medicine prescribed to you requires you to avoid  driving or using machinery. General instructions  Watch for any changes in how you feel.  Take deep breaths often, even if it hurts. This can help prevent lung problems.  You may be given a small machine to help exercise your lungs and breathing, to prevent lung complications.  Do not use any products that contain nicotine or tobacco, such as cigarettes, e-cigarettes, and chewing tobacco. If you need help quitting, ask your doctor.  Keep all follow-up visits as told by your doctor. This is important.   Contact a doctor if:  You have pain that: ? Gets worse. ? Happens more often. ? Does not get better with medicine.  You have a fever or chills.  Your cough gets worse.  You have trouble breathing.  You cough up mucus or thick spit (phlegm) that looks like pus. Get help right away if you:  Cough up blood.  Have symptoms that get worse. This includes: ? Trouble breathing. ? Feeling like it is hard to breathe. ? Noisy breathing.  Have pain that spreads into your neck, arms, or jaw.  Feel faint or dizzy. These symptoms may be an emergency. Do not wait to see if the symptoms will go away. Get medical help right away. Call your local emergency services (911 in the [place] Do not drive yourself to the hospital. Summary  Pleurisy is irritation and swelling of the linings of your lungs.  Pleurisy can cause pain and trouble breathing.  Do not use any products that contain  nicotine or tobacco, such as cigarettes, e-cigarettes, and chewing tobacco. If you need help quitting, ask your doctor.  Keep all follow-up visits as told by your doctor. This is important. This information is not intended to replace advice given to you by your health care provider. Make sure you discuss any questions you have with your health care provider. Document Revised: 01/27/2019 Document Reviewed: 01/27/2019 Elsevier Patient Education  2021 [place].  Preventing Hypertension Hypertension, also  called high blood pressure, is when the force of blood pumping through the arteries is too strong. Arteries are blood vessels that carry blood from the heart throughout the body. Often, hypertension does not cause symptoms until blood pressure is very high. It is important to have your blood pressure checked regularly. Diet and lifestyle changes can help you prevent hypertension, and they may make you feel better overall and improve your quality of life. If you already have hypertension, you may control it with diet and lifestyle changes, as well as with medicine. How can this condition affect me? Over time, hypertension can damage the arteries and decrease blood flow to important parts of the body, including the brain, heart, and kidneys. By keeping your blood pressure in a healthy range, you can help prevent complications like heart attack, heart failure, stroke, kidney failure, and vascular dementia. What can increase my risk?  Being an older adult. Older people are more often affected.  Having family members who have had high blood pressure.  Being obese.  Being male. Males are more likely to have high blood pressure.  Drinking too much alcohol or caffeine.  Smoking or using illegal drugs.  Taking certain medicines, such as antidepressants, decongestants, birth control pills, and NSAIDs, such as ibuprofen.  Having thyroid problems.  Having certain tumors. What actions can I take to prevent or manage this condition? Work with your health care provider to make a hypertension prevention plan that works for you. Follow your plan and keep all follow-up visits as told by your health care provider. Diet changes Maintain a healthy diet. This includes:  Eating less salt (sodium). Ask your health care provider how much sodium is safe for you to have. The general recommendation is to have less than 1 tsp (2,300 mg) of sodium a day. ? Do not add salt to your food. ? Choose low-sodium options  when grocery shopping and eating out.  Limiting fats in your diet. You can do this by eating low-fat or fat-free dairy products and by eating less red meat.  Eating more fruits, vegetables, and whole grains. Make a goal to eat: ? 1-2 cups of fresh fruits and vegetables each day. ? 3-4 servings of whole grains each day.  Avoiding foods and beverages that have added sugars.  Eating fish that contain healthy fats (omega-3 fatty acids), such as mackerel or salmon. If you need help putting together a healthy eating plan, try the DASH diet. This diet is high in fruits, vegetables, and whole grains. It is low in sodium, red meat, and added sugars. DASH stands for Dietary Approaches to Stop Hypertension.   Lifestyle changes Lose weight if you are overweight. Losing just 3?5% of your body weight can help prevent or control hypertension. For example, if your present weight is 200 lb (91 kg), a loss of 3-5% of your weight means losing 6-10 lb (2.7-4.5 kg). Ask your health care provider to help you with a diet and exercise plan to safely lose weight. Other recommendations usually include:  Get  enough exercise. Do at least 150 minutes of moderate-intensity exercise each week. You could do this in short exercise sessions several times a day, or you could do longer exercise sessions a few times a week. For example, you could take a brisk 10-minute walk or bike ride, 3 times a day, for 5 days a week.  Find ways to reduce stress, such as exercising, meditating, listening to music, or taking a yoga class. If you need help reducing stress, ask your health care provider.  Do not use any products that contain nicotine or tobacco, such as cigarettes, e-cigarettes, and chewing tobacco. If you need help quitting, ask your health care provider. Chemicals in tobacco and nicotine products raise your blood pressure each time you use them. If you need help quitting, ask your health care provider.  Learn how to check your  blood pressure at home. Make sure that you know your personal target blood pressure, as told by your health care provider.  Try to sleep 7-9 hours per night.   Alcohol use  Do not drink alcohol if: ? Your health care provider tells you not to drink. ? You are pregnant, may be pregnant, or are planning to become pregnant.  If you drink alcohol: ? Limit how much you use to:  0-1 drink a day for women.  0-2 drinks a day for men. ? Be aware of how much alcohol is in your drink. In the [place] one drink equals one 12 oz bottle of beer (355 mL), one 5 oz glass of wine (148 mL), or one 1 oz glass of hard liquor (44 mL). Medicines In addition to diet and lifestyle changes, your health care provider may recommend medicines to help lower your blood pressure. In general:  You may need to try a few different medicines to find what works best for you.  You may need to take more than one medicine.  Take over-the-counter and prescription medicines only as told by your health care provider. Questions to ask your health care provider  What is my blood pressure goal?  How can I lower my risk for high blood pressure?  How should I monitor my blood pressure at home? Where to find support Your health care provider can help you prevent hypertension and help you keep your blood pressure at a healthy level. Your local hospital or your community may also provide support services and prevention programs. The American Heart Association offers an online support network at supportnetwork.heart.org Where to find more information Learn more about hypertension from:  [place], Lung, and [place]: https://wilson-eaton.com/  Centers for Disease Control and Prevention: http://www.wolf.info/  American Academy of Family Physicians: familydoctor.org Learn more about the DASH diet from:  [place], Lung, and [place]: https://wilson-eaton.com/ Contact a health care provider if:  You think you are having a  reaction to medicines you have taken.  You have recurrent headaches or feel dizzy.  You have swelling in your ankles.  You have trouble with your vision. Get help right away if:  You have sudden, severe chest, back, or abdominal pain or discomfort.  You have shortness of breath.  You have a sudden, severe headache. These symptoms may represent a serious problem that is an emergency. Do not wait to see if the symptoms will go away. Get medical help right away. Call your local emergency services (911 in the [place] Do not drive yourself to the hospital.  Summary  Hypertension often does not cause any symptoms until blood  pressure is very high. It is important to get your blood pressure checked regularly.  Diet and lifestyle changes are important steps in preventing hypertension.  By keeping your blood pressure in a healthy range, you may prevent complications like heart attack, heart failure, stroke, and kidney failure.  Work with your health care provider to make a hypertension prevention plan that works for you. This information is not intended to replace advice given to you by your health care provider. Make sure you discuss any questions you have with your health care provider. Document Revised: 11/23/2018 Document Reviewed: 11/23/2018 Elsevier Patient Education  2021 [place].  Shoulder Pain Many things can cause shoulder pain, including:  An injury to the shoulder.  Overuse of the shoulder.  Arthritis. The source of the pain can be:  Inflammation.  An injury to the shoulder joint.  An injury to a tendon, ligament, or bone. Follow these instructions at home: Pay attention to changes in your symptoms. Let your health care provider know about them. Follow these instructions to relieve your pain. If you have a sling:  Wear the sling as told by your health care provider. Remove it only as told by your health care provider.  Loosen the sling if your fingers tingle,  become numb, or turn cold and blue.  Keep the sling clean.  If the sling is not waterproof: ? Do not let it get wet. Remove it to shower or bathe.  Move your arm as little as possible, but keep your hand moving to prevent swelling. Managing pain, stiffness, and swelling  If directed, put ice on the painful area: ? Put ice in a plastic bag. ? Place a towel between your skin and the bag. ? Leave the ice on for 20 minutes, 2-3 times per day. Stop applying ice if it does not help with the pain.  Squeeze a soft ball or a foam pad as much as possible. This helps to keep the shoulder from swelling. It also helps to strengthen the arm.   General instructions  Take over-the-counter and prescription medicines only as told by your health care provider.  Keep all follow-up visits as told by your health care provider. This is important. Contact a health care provider if:  Your pain gets worse.  Your pain is not relieved with medicines.  New pain develops in your arm, hand, or fingers. Get help right away if:  Your arm, hand, or fingers: ? Tingle. ? Become numb. ? Become swollen. ? Become painful. ? Turn white or blue. Summary  Shoulder pain can be caused by an injury, overuse, or arthritis.  Pay attention to changes in your symptoms. Let your health care provider know about them.  This condition may be treated with a sling, ice, and pain medicines.  Contact your health care provider if the pain gets worse or new pain develops. Get help right away if your arm, hand, or fingers tingle or become numb, swollen, or painful.  Keep all follow-up visits as told by your health care provider. This is important. This information is not intended to replace advice given to you by your health care provider. Make sure you discuss any questions you have with your health care provider. Document Revised: 07/07/2017 Document Reviewed: 07/07/2017 Elsevier Patient Education  2021 [place].  [email or web address]">  DASH Eating Plan DASH stands for Dietary Approaches to Stop Hypertension. The DASH eating plan is a healthy eating plan that has been shown to:  Reduce high blood  pressure (hypertension).  Reduce your risk for type 2 diabetes, heart disease, and stroke.  Help with weight loss. What are tips for following this plan? Reading food labels  Check food labels for the amount of salt (sodium) per serving. Choose foods with less than 5 percent of the Daily Value of sodium. Generally, foods with less than 300 milligrams (mg) of sodium per serving fit into this eating plan.  To find whole grains, look for the word "whole" as the first word in the ingredient list. Shopping  Buy products labeled as "low-sodium" or "no salt added."  Buy fresh foods. Avoid canned foods and pre-made or frozen meals. Cooking  Avoid adding salt when cooking. Use salt-free seasonings or herbs instead of table salt or sea salt. Check with your health care provider or pharmacist before using salt substitutes.  Do not fry foods. Cook foods using healthy methods such as baking, boiling, grilling, roasting, and broiling instead.  Cook with heart-healthy oils, such as olive, canola, avocado, soybean, or sunflower oil. Meal planning  Eat a balanced diet that includes: ? 4 or more servings of fruits and 4 or more servings of vegetables each day. Try to fill one-half of your plate with fruits and vegetables. ? 6-8 servings of whole grains each day. ? Less than 6 oz (170 g) of lean meat, poultry, or fish each day. A 3-oz (85-g) serving of meat is about the same size as a deck of cards. One egg equals 1 oz (28 g). ? 2-3 servings of low-fat dairy each day. One serving is 1 cup (237 mL). ? 1 serving of nuts, seeds, or beans 5 times each week. ? 2-3 servings of heart-healthy fats. Healthy fats called omega-3 fatty acids are found in foods such as walnuts,  flaxseeds, fortified milks, and eggs. These fats are also found in cold-water fish, such as sardines, salmon, and mackerel.  Limit how much you eat of: ? Canned or prepackaged foods. ? Food that is high in trans fat, such as some fried foods. ? Food that is high in saturated fat, such as fatty meat. ? Desserts and other sweets, sugary drinks, and other foods with added sugar. ? Full-fat dairy products.  Do not salt foods before eating.  Do not eat more than 4 egg yolks a week.  Try to eat at least 2 vegetarian meals a week.  Eat more home-cooked food and less restaurant, buffet, and fast food.   Lifestyle  When eating at a restaurant, ask that your food be prepared with less salt or no salt, if possible.  If you drink alcohol: ? Limit how much you use to:  0-1 drink a day for women who are not pregnant.  0-2 drinks a day for men. ? Be aware of how much alcohol is in your drink. In the [place] one drink equals one 12 oz bottle of beer (355 mL), one 5 oz glass of wine (148 mL), or one 1 oz glass of hard liquor (44 mL). General information  Avoid eating more than 2,300 mg of salt a day. If you have hypertension, you may need to reduce your sodium intake to 1,500 mg a day.  Work with your health care provider to maintain a healthy body weight or to lose weight. Ask what an ideal weight is for you.  Get at least 30 minutes of exercise that causes your heart to beat faster (aerobic exercise) most days of the week. Activities may include walking, swimming, or biking.  Work with your health care provider or dietitian to adjust your eating plan to your individual calorie needs. What foods should I eat? Fruits All fresh, dried, or frozen fruit. Canned fruit in natural juice (without added sugar). Vegetables Fresh or frozen vegetables (raw, steamed, roasted, or grilled). Low-sodium or reduced-sodium tomato and vegetable juice. Low-sodium or reduced-sodium tomato sauce and tomato paste.  Low-sodium or reduced-sodium canned vegetables. Grains Whole-grain or whole-wheat bread. Whole-grain or whole-wheat pasta. Brown rice. [place]. Bulgur. Whole-grain and low-sodium cereals. Pita bread. Low-fat, low-sodium crackers. Whole-wheat flour tortillas. Meats and other proteins Skinless chicken or [place]. Ground chicken or [place]. Pork with fat trimmed off. Fish and seafood. Egg whites. Dried beans, peas, or lentils. Unsalted nuts, nut butters, and seeds. Unsalted canned beans. Lean cuts of beef with fat trimmed off. Low-sodium, lean precooked or cured meat, such as sausages or meat loaves. Dairy Low-fat (1%) or fat-free (skim) milk. Reduced-fat, low-fat, or fat-free cheeses. Nonfat, low-sodium ricotta or cottage cheese. Low-fat or nonfat yogurt. Low-fat, low-sodium cheese. Fats and oils Soft margarine without trans fats. Vegetable oil. Reduced-fat, low-fat, or light mayonnaise and salad dressings (reduced-sodium). Canola, safflower, olive, avocado, soybean, and sunflower oils. Avocado. Seasonings and condiments Herbs. Spices. Seasoning mixes without salt. Other foods Unsalted popcorn and pretzels. Fat-free sweets. The items listed above may not be a complete list of foods and beverages you can eat. Contact a dietitian for more information. What foods should I avoid? Fruits Canned fruit in a light or heavy syrup. Fried fruit. Fruit in cream or butter sauce. Vegetables Creamed or fried vegetables. Vegetables in a cheese sauce. Regular canned vegetables (not low-sodium or reduced-sodium). Regular canned tomato sauce and paste (not low-sodium or reduced-sodium). Regular tomato and vegetable juice (not low-sodium or reduced-sodium). Angie Fava. Olives. Grains Baked goods made with fat, such as croissants, muffins, or some breads. Dry pasta or rice meal packs. Meats and other proteins Fatty cuts of meat. Ribs. Fried meat. Berniece Salines. Bologna, salami, and other precooked or cured meats, such as  sausages or meat loaves. Fat from the back of a pig (fatback). Bratwurst. Salted nuts and seeds. Canned beans with added salt. Canned or smoked fish. Whole eggs or egg yolks. Chicken or [place] with skin. Dairy Whole or 2% milk, cream, and half-and-half. Whole or full-fat cream cheese. Whole-fat or sweetened yogurt. Full-fat cheese. Nondairy creamers. Whipped toppings. Processed cheese and cheese spreads. Fats and oils Butter. Stick margarine. Lard. Shortening. Ghee. Bacon fat. Tropical oils, such as coconut, palm kernel, or palm oil. Seasonings and condiments Onion salt, garlic salt, seasoned salt, table salt, and sea salt. Worcestershire sauce. Tartar sauce. Barbecue sauce. Teriyaki sauce. Soy sauce, including reduced-sodium. Steak sauce. Canned and packaged gravies. Fish sauce. Oyster sauce. Cocktail sauce. Store-bought horseradish. Ketchup. Mustard. Meat flavorings and tenderizers. Bouillon cubes. Hot sauces. Pre-made or packaged marinades. Pre-made or packaged taco seasonings. Relishes. Regular salad dressings. Other foods Salted popcorn and pretzels. The items listed above may not be a complete list of foods and beverages you should avoid. Contact a dietitian for more information. Where to find more information  National Heart, Lung, and Blood Institute: https://wilson-eaton.com/  American Heart Association: www.heart.org  Academy of Nutrition and Dietetics: www.eatright.[place]: www.kidney.org Summary  The DASH eating plan is a healthy eating plan that has been shown to reduce high blood pressure (hypertension). It may also reduce your risk for type 2 diabetes, heart disease, and stroke.  When on the DASH eating plan, aim to eat more fresh  fruits and vegetables, whole grains, lean proteins, low-fat dairy, and heart-healthy fats.  With the DASH eating plan, you should limit salt (sodium) intake to 2,300 mg a day. If you have hypertension, you may need to reduce your  sodium intake to 1,500 mg a day.  Work with your health care provider or dietitian to adjust your eating plan to your individual calorie needs. This information is not intended to replace advice given to you by your health care provider. Make sure you discuss any questions you have with your health care provider. Document Revised: 11/26/2018 Document Reviewed: 11/26/2018 Elsevier Patient Education  2021 [employer].

## 2020-02-27 ENCOUNTER — Other Ambulatory Visit: Payer: Self-pay

## 2020-02-27 ENCOUNTER — Encounter: Payer: Self-pay | Admitting: Cardiology

## 2020-02-27 ENCOUNTER — Ambulatory Visit (INDEPENDENT_AMBULATORY_CARE_PROVIDER_SITE_OTHER): Payer: Medicare Other | Admitting: Cardiology

## 2020-02-27 VITALS — BP 100/60 | HR 64 | Ht 69.0 in | Wt 196.0 lb

## 2020-02-27 DIAGNOSIS — R079 Chest pain, unspecified: Secondary | ICD-10-CM

## 2020-02-27 DIAGNOSIS — E7800 Pure hypercholesterolemia, unspecified: Secondary | ICD-10-CM

## 2020-02-27 DIAGNOSIS — I2510 Atherosclerotic heart disease of native coronary artery without angina pectoris: Secondary | ICD-10-CM

## 2020-02-27 MED ORDER — ASPIRIN EC 81 MG PO TBEC: 81.0000 mg | DELAYED_RELEASE_TABLET | Freq: Every day | ORAL | 3 refills | Status: AC

## 2020-02-27 MED ORDER — ATORVASTATIN CALCIUM 40 MG PO TABS: 40.0000 mg | ORAL_TABLET | Freq: Every day | ORAL | 3 refills | Status: DC

## 2020-02-27 NOTE — Progress Notes (Signed)
Cardiology Office Note:    Date:  02/27/2020   ID:  Michael Durham, Michael Durham [birth year], MRN [id number]  PCP:  Billie Ruddy, MD   [place]  Cardiologist:  No primary care provider on file.  Advanced Practice Provider:  No care team member to display Electrophysiologist:  None       Referring MD: Billie Ruddy, MD     History of Present Illness:    Michael Durham is a 65 y.o. male here for the evaluation of chest pain.  He was seen in the emergency department on 02/17/2020.  Chest pain was left-sided.  Sharp, severe constant.  No significant shortness of breath.  Quit tob 15 yearts  25-30 yrs. Hurt worse with deep breath. 3 days went away. Shoulder are sore. Art.  Lab work from the emergency department was normal.  Troponin was normal.  D-dimer was slightly elevated and a CT angiogram showed no evidence of PE.  Coronary artery calcification noted.  EKG showed sinus rhythm 70 without any abnormalities.  He is a retired Pharmacist, community. Former smoker for about 25 to 30 years quit about 15 years ago.  Currently feeling better.  No fever chills nausea vomiting syncope bleeding.  Left shoulder pain likely arthritic.  Past Medical History:  Diagnosis Date  . ADHD   . Allergy   . Anxiety   . Bipolar 2 disorder ([place])   . Depression   . Diverticulosis   . ED (erectile dysfunction)   . Encephalitis, Age 28   . HLD (hyperlipidemia)   . Hx of colonic polyp - last colonoscopy with Dr. Alinda Sierras on 12/21/13 08/03/2018  . Vitamin D deficiency     Past Surgical History:  Procedure Laterality Date  . BRAIN SURGERY    . COLONOSCOPY    . MOUTH SURGERY      Current Medications: Current Meds  Medication Sig  . amphetamine-dextroamphetamine (ADDERALL) 20 MG tablet Take 1 tablet (20 mg total) by mouth 2 (two) times daily.  . ARIPiprazole (ABILIFY) 5 MG tablet TAKE 1 TABLET(5 MG) BY MOUTH AT BEDTIME  . aspirin EC 81 MG tablet Take 1 tablet (81 mg total) by  mouth daily. Swallow whole.  Marland Kitchen atorvastatin (LIPITOR) 40 MG tablet Take 1 tablet (40 mg total) by mouth daily.  . Cholecalciferol (VITAMIN D) 2000 units CAPS Take 2,000 Units by mouth daily.  Marland Kitchen escitalopram (LEXAPRO) 20 MG tablet TAKE 1 TABLET DAILY  . tadalafil (CIALIS) 5 MG tablet 1-3 tabs po daily as needed.  . [DISCONTINUED] atorvastatin (LIPITOR) 20 MG tablet Take 1 tablet by mouth daily     Allergies:   Wellbutrin [bupropion] and Tetanus toxoids   Social History   Socioeconomic History  . Marital status: Married    Spouse name: Not on file  . Number of children: Not on file  . Years of education: Not on file  . Highest education level: Not on file  Occupational History  . Not on file  Tobacco Use  . Smoking status: Former Research scientist (life sciences)  . Smokeless tobacco: Never Used  . Tobacco comment: Quit > 15 years ago, smoked 1-2 ppd for 30 years  Vaping Use  . Vaping Use: Never used  Substance and Sexual Activity  . Alcohol use: Yes    Comment: occasionally  . Drug use: No  . Sexual activity: Yes    Partners: Female    Birth control/protection: None  Other Topics Concern  . Not on file  Social History Narrative  . Not on file   Social Determinants of Health   Financial Resource Strain: Low Risk   . Difficulty of Paying Living Expenses: Not hard at all  Food Insecurity: No Food Insecurity  . Worried About Charity fundraiser in the Last Year: Never true  . Ran Out of Food in the Last Year: Never true  Transportation Needs: No Transportation Needs  . Lack of Transportation (Medical): No  . Lack of Transportation (Non-Medical): No  Physical Activity: Inactive  . Days of Exercise per Week: 0 days  . Minutes of Exercise per Session: 0 min  Stress: No Stress Concern Present  . Feeling of Stress : Not at all  Social Connections: Moderately Integrated  . Frequency of Communication with Friends and Family: Twice a week  . Frequency of Social Gatherings with Friends and Family: Once  a week  . Attends Religious Services: Never  . Active Member of Clubs or Organizations: Yes  . Attends Archivist Meetings: 1 to 4 times per year  . Marital Status: Married     Family History: The patient's family history includes Bladder Cancer in his father; Breast cancer in his maternal aunt; Dementia in his mother; Diabetes in his brother; Prostate cancer in his father. There is no history of Colon cancer, Esophageal cancer, Stomach cancer, Pancreatic cancer, Inflammatory bowel disease, Liver disease, or Rectal cancer.  ROS:   Please see the history of present illness.     All other systems reviewed and are negative.  EKGs/Labs/Other Studies Reviewed:    The following studies were reviewed today: CT scan of chest -Personally reviewed with him, LAD calcified plaque noted.  EKG: Sinus rhythm no other abnormalities from ER visit  Recent Labs: 02/17/2020: Hemoglobin 15.4; Platelets 286 02/22/2020: BUN 12; Creatinine, Ser 1.03; Potassium 4.8; Sodium 135  Recent Lipid Panel    Component Value Date/Time   CHOL 136 02/22/2020 1047   TRIG 112.0 02/22/2020 1047   HDL 45.90 02/22/2020 1047   CHOLHDL 3 02/22/2020 1047   VLDL 22.4 02/22/2020 1047   LDLCALC 68 02/22/2020 1047     Risk Assessment/Calculations:      Physical Exam:    VS:  BP 100/60 (BP Location: Left Arm, Patient Position: Sitting, Cuff Size: Normal)   Pulse 64   Ht 5\' 9"  (1.753 m)   Wt 196 lb (88.9 kg)   SpO2 99%   BMI 28.94 kg/m     Wt Readings from Last 3 Encounters:  02/27/20 196 lb (88.9 kg)  02/22/20 188 lb (85.3 kg)  02/17/20 185 lb (83.9 kg)     GEN:  Well nourished, well developed in no acute distress HEENT: Normal NECK: No JVD; No carotid bruits LYMPHATICS: No lymphadenopathy CARDIAC: RRR, no murmurs, rubs, gallops RESPIRATORY:  Clear to auscultation without rales, wheezing or rhonchi  ABDOMEN: Soft, non-tender, non-distended MUSCULOSKELETAL:  No edema; No deformity  SKIN: Warm  and dry NEUROLOGIC:  Alert and oriented x 3 PSYCHIATRIC:  Normal affect   ASSESSMENT:    1. Chest pain of uncertain etiology   2. Pure hypercholesterolemia   3. Coronary artery disease involving native coronary artery of native heart without angina pectoris    PLAN:    In order of problems listed above:  Chest pain -Left-sided chest discomfort, likely musculoskeletal.  Resolved.  Feels better.  However, given his coronary artery disease, calcified plaque, prior smoking history, we will go ahead and proceed with Lexiscan stress test to make  sure he does not have any high risk areas of ischemia.  Coronary artery disease/calcified plaque -LAD calcification noted. -Start aspirin 81 mg a day.  He did note some bruising previously on aspirin. -Increase his atorvastatin from 20 up to 40 mg for high intensity dosing given his coronary artery disease.  Hyperlipidemia -Increasing atorvastatin to 40.  His last LDL was 68 which is excellent however given his calcified plaque we would like for him to be on a high intensity dose. -In 3 months check lipid panel and ALT.  See him back in 6 months or sooner if necessary.     Shared Decision Making/Informed Consent The risks [chest pain, shortness of breath, cardiac arrhythmias, dizziness, blood pressure fluctuations, myocardial infarction, stroke/transient ischemic attack, nausea, vomiting, allergic reaction, radiation exposure, metallic taste sensation and life-threatening complications (estimated to be 1 in 10,000)], benefits (risk stratification, diagnosing coronary artery disease, treatment guidance) and alternatives of a nuclear stress test were discussed in detail with Mr. Dollens and he agrees to proceed.       Medication Adjustments/Labs and Tests Ordered: Current medicines are reviewed at length with the patient today.  Concerns regarding medicines are outlined above.  Orders Placed This Encounter  Procedures  . Lipid panel  . ALT   . Myocardial Perfusion Imaging   Meds ordered this encounter  Medications  . atorvastatin (LIPITOR) 40 MG tablet    Sig: Take 1 tablet (40 mg total) by mouth daily.    Dispense:  90 tablet    Refill:  3  . aspirin EC 81 MG tablet    Sig: Take 1 tablet (81 mg total) by mouth daily. Swallow whole.    Dispense:  90 tablet    Refill:  3    Patient Instructions  Medication Instructions:  Your physician has recommended you make the following change in your medication:   Begin Atorvastatin 40mg  - 1 tablet by mouth daily  ASA 81mg  1 tablet by mouth daily *If you need a refill on your cardiac medications before your next appointment, please call your pharmacy*   Lab Work: Lipid panel and ALT in 3 months - please call to schedule this appointment.  If you have labs (blood work) drawn today and your tests are completely normal, you will receive your results only by: Marland Kitchen MyChart Message (if you have MyChart) OR . A paper copy in the mail If you have any lab test that is abnormal or we need to change your treatment, we will call you to review the results.   Testing/Procedures: Your physician has requested that you have a lexiscan myoview. For further information please visit [email or web address]. Please follow instruction sheet, as given.    Follow-Up: At [facility], you and your health needs are our priority.  As part of our continuing mission to provide you with exceptional heart care, we have created designated Provider Care Teams.  These Care Teams include your primary Cardiologist (physician) and Advanced Practice Providers (APPs -  Physician Assistants and Nurse Practitioners) who all work together to provide you with the care you need, when you need it.  We recommend signing up for the patient portal called "MyChart".  Sign up information is provided on this After Visit Summary.  MyChart is used to connect with patients for Virtual Visits (Telemedicine).  Patients are able to  view lab/test results, encounter notes, upcoming appointments, etc.  Non-urgent messages can be sent to your provider as well.   To learn more about what you  can do with MyChart, go to [email or web address].    Your next appointment:   6 month(s)  The format for your next appointment:   In Person  Provider:   Dr Marlou Porch       Signed, Candee Furbish, MD  02/27/2020 5:56 PM    [place]

## 2020-02-27 NOTE — Patient Instructions (Addendum)
Medication Instructions:  Your physician has recommended you make the following change in your medication:   Begin Atorvastatin 40mg  - 1 tablet by mouth daily  ASA 81mg  1 tablet by mouth daily *If you need a refill on your cardiac medications before your next appointment, please call your pharmacy*   Lab Work: Lipid panel and ALT in 3 months - please call to schedule this appointment.  If you have labs (blood work) drawn today and your tests are completely normal, you will receive your results only by: Marland Kitchen MyChart Message (if you have MyChart) OR . A paper copy in the mail If you have any lab test that is abnormal or we need to change your treatment, we will call you to review the results.   Testing/Procedures: Your physician has requested that you have a lexiscan myoview. For further information please visit [email or web address]. Please follow instruction sheet, as given.    Follow-Up: At [facility], you and your health needs are our priority.  As part of our continuing mission to provide you with exceptional heart care, we have created designated Provider Care Teams.  These Care Teams include your primary Cardiologist (physician) and Advanced Practice Providers (APPs -  Physician Assistants and Nurse Practitioners) who all work together to provide you with the care you need, when you need it.  We recommend signing up for the patient portal called "MyChart".  Sign up information is provided on this After Visit Summary.  MyChart is used to connect with patients for Virtual Visits (Telemedicine).  Patients are able to view lab/test results, encounter notes, upcoming appointments, etc.  Non-urgent messages can be sent to your provider as well.   To learn more about what you can do with MyChart, go to [email or web address].    Your next appointment:   6 month(s)  The format for your next appointment:   In Person  Provider:   Dr Marlou Porch

## 2020-03-05 ENCOUNTER — Telehealth (HOSPITAL_COMMUNITY): Payer: Self-pay | Admitting: *Deleted

## 2020-03-05 NOTE — Telephone Encounter (Signed)
Left message on voicemail per DPR in reference to upcoming appointment scheduled on 03/07/20 with detailed instructions given per Myocardial Perfusion Study Information Sheet for the test. LM to arrive 15 minutes early, and that it is imperative to arrive on time for appointment to keep from having the test rescheduled. If you need to cancel or reschedule your appointment, please call the office within 24 hours of your appointment. Failure to do so may result in a cancellation of your appointment, and a $50 no show fee. Phone number given for call back for any questions. Kirstie Peri

## 2020-03-07 ENCOUNTER — Other Ambulatory Visit: Payer: Self-pay

## 2020-03-07 ENCOUNTER — Ambulatory Visit (HOSPITAL_COMMUNITY): Payer: Medicare Other | Attending: Internal Medicine

## 2020-03-07 DIAGNOSIS — I2510 Atherosclerotic heart disease of native coronary artery without angina pectoris: Secondary | ICD-10-CM

## 2020-03-07 LAB — MYOCARDIAL PERFUSION IMAGING
LV dias vol: 88 mL (ref 62–150)
LV sys vol: 40 mL
Peak HR: 88 {beats}/min
Rest HR: 60 {beats}/min
SDS: 0
SRS: 0
SSS: 0
TID: 0.99

## 2020-03-07 MED ORDER — TECHNETIUM TC 99M TETROFOSMIN IV KIT
10.8000 | PACK | Freq: Once | INTRAVENOUS | Status: AC | PRN
  Administered 2020-03-07: 10.8 via INTRAVENOUS
  Filled 2020-03-07: qty 11

## 2020-03-07 MED ORDER — REGADENOSON 0.4 MG/5ML IV SOLN
0.4000 mg | Freq: Once | INTRAVENOUS | Status: AC
  Administered 2020-03-07: 0.4 mg via INTRAVENOUS

## 2020-03-07 MED ORDER — TECHNETIUM TC 99M TETROFOSMIN IV KIT
30.9000 | PACK | Freq: Once | INTRAVENOUS | Status: AC | PRN
  Administered 2020-03-07: 30.9 via INTRAVENOUS
  Filled 2020-03-07: qty 31

## 2020-03-08 NOTE — Progress Notes (Signed)
Pt has been made aware of normal result and verbalized understanding.  jw

## 2020-03-21 ENCOUNTER — Encounter: Payer: Self-pay | Admitting: Family Medicine

## 2020-03-21 ENCOUNTER — Ambulatory Visit (INDEPENDENT_AMBULATORY_CARE_PROVIDER_SITE_OTHER): Payer: Medicare Other | Admitting: Family Medicine

## 2020-03-21 ENCOUNTER — Other Ambulatory Visit: Payer: Self-pay

## 2020-03-21 VITALS — BP 120/84 | HR 64 | Temp 98.0°F | Wt 192.2 lb

## 2020-03-21 DIAGNOSIS — F902 Attention-deficit hyperactivity disorder, combined type: Secondary | ICD-10-CM

## 2020-03-21 DIAGNOSIS — I2510 Atherosclerotic heart disease of native coronary artery without angina pectoris: Secondary | ICD-10-CM

## 2020-03-21 DIAGNOSIS — F3181 Bipolar II disorder: Secondary | ICD-10-CM | POA: Diagnosis not present

## 2020-03-21 DIAGNOSIS — Z0130 Encounter for examination of blood pressure without abnormal findings: Secondary | ICD-10-CM | POA: Diagnosis not present

## 2020-03-21 MED ORDER — AMPHETAMINE-DEXTROAMPHETAMINE 20 MG PO TABS: 20.0000 mg | ORAL_TABLET | Freq: Two times a day (BID) | ORAL | 0 refills | Status: DC

## 2020-03-21 MED ORDER — AMPHETAMINE-DEXTROAMPHETAMINE 20 MG PO TABS
20.0000 mg | ORAL_TABLET | Freq: Two times a day (BID) | ORAL | 0 refills | Status: DC
Start: 2020-03-21 — End: 2020-10-17

## 2020-03-21 MED ORDER — AMPHETAMINE-DEXTROAMPHETAMINE 20 MG PO TABS
20.0000 mg | ORAL_TABLET | Freq: Two times a day (BID) | ORAL | 0 refills | Status: DC
Start: 2020-03-21 — End: 2020-09-07

## 2020-03-21 MED ORDER — ARIPIPRAZOLE 5 MG PO TABS: ORAL_TABLET | ORAL | 3 refills | Status: DC

## 2020-03-21 MED ORDER — ESCITALOPRAM OXALATE 20 MG PO TABS: ORAL_TABLET | ORAL | 3 refills | Status: DC

## 2020-03-21 NOTE — Patient Instructions (Signed)

## 2020-03-21 NOTE — Progress Notes (Signed)
Subjective:    Patient ID: Michael Durham, male    DOB: [birth year], 65 y.o.   MRN: [id number]  Chief Complaint  Patient presents with  . Follow-up    HPI Patient was seen today for follow-up on ADHD and BP check.  Patient stable on Adderall 20 mg.  Patient endorses good appetite.  States need to decrease junk food intake.  Endorses good sleep and concentration.  BP improved since last OFV.  Patient had questions about whether he could use OTC Voltaren gel on shoulder for arthritis.  Past Medical History:  Diagnosis Date  . ADHD   . Allergy   . Anxiety   . Bipolar 2 disorder ([place])   . Depression   . Diverticulosis   . ED (erectile dysfunction)   . Encephalitis, Age 67   . HLD (hyperlipidemia)   . Hx of colonic polyp - last colonoscopy with Dr. Alinda Sierras on 12/21/13 08/03/2018  . Vitamin D deficiency     Allergies  Allergen Reactions  . Wellbutrin [Bupropion] Other (See Comments)    Severe headache.  . Tetanus Toxoids     "old horse serum preparation"    ROS General: Denies fever, chills, night sweats, changes in weight, changes in appetite HEENT: Denies headaches, ear pain, changes in vision, rhinorrhea, sore throat CV: Denies CP, palpitations, SOB, orthopnea Pulm: Denies SOB, cough, wheezing GI: Denies abdominal pain, nausea, vomiting, diarrhea, constipation GU: Denies dysuria, hematuria, frequency Msk: Denies muscle cramps  +b/l shoulder pain Neuro: Denies weakness, numbness, tingling Skin: Denies rashes, bruising Psych: Denies depression, anxiety, hallucinations     Objective:    Blood pressure 120/84, pulse 64, temperature 98 F (36.7 C), temperature source Oral, weight 192 lb 3.2 oz (87.2 kg), SpO2 98 %.  Gen. Pleasant, well-nourished, in no distress, normal affect HEENT: [place]/AT, face symmetric, conjunctiva clear, no scleral icterus, PERRLA, EOMI, nares patent without drainage Lungs: no accessory muscle use, CTAB, no wheezes or rales Cardiovascular: RRR, no  m/r/g, no peripheral edema Musculoskeletal: No deformities, no cyanosis or clubbing, normal tone Neuro:  A&Ox3, CN II-XII intact, normal gait Skin:  Warm, no lesions/ rash   Wt Readings from Last 3 Encounters:  03/21/20 192 lb 3.2 oz (87.2 kg)  03/07/20 196 lb (88.9 kg)  02/27/20 196 lb (88.9 kg)    Lab Results  Component Value Date   WBC 8.2 02/17/2020   HGB 15.4 02/17/2020   HCT 46.4 02/17/2020   PLT 286 02/17/2020   GLUCOSE 65 (L) 02/22/2020   CHOL 136 02/22/2020   TRIG 112.0 02/22/2020   HDL 45.90 02/22/2020   LDLCALC 68 02/22/2020   ALT 32 08/03/2018   AST 21 08/03/2018   NA 135 02/22/2020   K 4.8 02/22/2020   CL 99 02/22/2020   CREATININE 1.03 02/22/2020   BUN 12 02/22/2020   CO2 31 02/22/2020   TSH 2.06 03/10/2018   HGBA1C 5.9 08/03/2018    Assessment/Plan:  Attention deficit hyperactivity disorder (ADHD), combined type -Stable -PMDP reviewed -Patient unable to provide sample for UDS.  Willing to return to clinic. -Continue Adderall 20 mg twice daily - Plan: amphetamine-dextroamphetamine (ADDERALL) 20 MG tablet, amphetamine-dextroamphetamine (ADDERALL) 20 MG tablet, amphetamine-dextroamphetamine (ADDERALL) 20 MG tablet  Bipolar 2 disorder ([facility])  -stable -PHQ 9 score 5 on 02/13/2020 -Continue current medications including Lexapro 20 mg and Abilify 5 mg daily -Continue to monitor - Plan: escitalopram (LEXAPRO) 20 MG tablet, ARIPiprazole (ABILIFY) 5 MG tablet  Blood pressure check -Elevated 146/88 at [facility] 02/22/2020 -Now  controlled -Lifestyle modifications -Continue to monitor.  For future elevation consider medication  F/u in 3 months, sooner if needed  Grier Mitts, MD

## 2020-04-27 ENCOUNTER — Other Ambulatory Visit: Payer: Self-pay | Admitting: *Deleted

## 2020-04-27 MED ORDER — ROSUVASTATIN CALCIUM 20 MG PO TABS: 20.0000 mg | ORAL_TABLET | Freq: Every day | ORAL | 3 refills | Status: DC

## 2020-04-27 NOTE — Telephone Encounter (Signed)
Attempted to contact pt.  VM was full.  Will reply through [place].

## 2020-05-30 ENCOUNTER — Other Ambulatory Visit: Payer: Medicare Other

## 2020-07-16 ENCOUNTER — Ambulatory Visit (INDEPENDENT_AMBULATORY_CARE_PROVIDER_SITE_OTHER): Payer: Medicare Other | Admitting: Family Medicine

## 2020-07-16 ENCOUNTER — Encounter: Payer: Self-pay | Admitting: Family Medicine

## 2020-07-16 ENCOUNTER — Other Ambulatory Visit: Payer: Self-pay

## 2020-07-16 VITALS — BP 118/80 | HR 73 | Temp 97.9°F | Wt 193.0 lb

## 2020-07-16 DIAGNOSIS — M6740 Ganglion, unspecified site: Secondary | ICD-10-CM | POA: Diagnosis not present

## 2020-07-16 DIAGNOSIS — N529 Male erectile dysfunction, unspecified: Secondary | ICD-10-CM | POA: Diagnosis not present

## 2020-07-16 DIAGNOSIS — B351 Tinea unguium: Secondary | ICD-10-CM | POA: Diagnosis not present

## 2020-07-16 DIAGNOSIS — E782 Mixed hyperlipidemia: Secondary | ICD-10-CM

## 2020-07-16 DIAGNOSIS — I2510 Atherosclerotic heart disease of native coronary artery without angina pectoris: Secondary | ICD-10-CM | POA: Diagnosis not present

## 2020-07-16 MED ORDER — TADALAFIL 5 MG PO TABS: ORAL_TABLET | ORAL | 2 refills | Status: DC

## 2020-07-16 NOTE — Progress Notes (Signed)
Subjective:    Patient ID: Michael Durham, male    DOB: 04/24/1955, 65 y.o.   MRN: [id number]  Chief Complaint  Patient presents with   Cyst    On rt palm, states it has been there a while, bothers him while driving   HPI Patient was seen today for acute concern.  Patient endorses cyst in R palm.  Previously drained yrs ago.  Has returned, causes pain in palm with driving.  Pt inquires about exercising/target HR.  Considering going back to Podiatrist as having tenderness in mid R foot 1 yr s/p surgery.  Also notes thickening of L great toenail.  Does not cause pain.  Switched to crestor 20 mg for cholesterol after having myalgias with atorvastatin.  Not currently having jt pain or myalgias.  Requests refill on Cialis.  Pt inquires about booster vaccine.  Has Pfizer COVID vaccine and booster on 02/16/2019, 03/09/2019, 10/31/2019.  Also inquires about shingles vaccine.  States had 1 done 10+ years ago with Dr. Constance Holster.  Past Medical History:  Diagnosis Date   ADHD    Allergy    Anxiety    Bipolar 2 disorder ([place])    Depression    Diverticulosis    ED (erectile dysfunction)    Encephalitis, Age 18    HLD (hyperlipidemia)    Hx of colonic polyp - last colonoscopy with Dr. Alinda Sierras on 12/21/13 08/03/2018   Vitamin D deficiency    Family History  Problem Relation Age of Onset   Prostate cancer Father    Bladder Cancer Father    Diabetes Brother    Dementia Mother    Breast cancer Maternal Aunt    Colon cancer Neg Hx    Esophageal cancer Neg Hx    Stomach cancer Neg Hx    Pancreatic cancer Neg Hx    Inflammatory bowel disease Neg Hx    Liver disease Neg Hx    Rectal cancer Neg Hx      Allergies  Allergen Reactions   Wellbutrin [Bupropion] Other (See Comments)    Severe headache.   Tetanus Toxoids     "old horse serum preparation"    ROS General: Denies fever, chills, night sweats, changes in weight, changes in appetite HEENT: Denies headaches, ear pain, changes in vision,  rhinorrhea, sore throat CV: Denies CP, palpitations, SOB, orthopnea Pulm: Denies SOB, cough, wheezing GI: Denies abdominal pain, nausea, vomiting, diarrhea, constipation GU: Denies dysuria, hematuria, frequency Msk: Denies muscle cramps, joint pains +soreness in R foot Neuro: Denies weakness, numbness, tingling Skin: Denies rashes, bruising  +bump in R hand, thick L great toenail Psych: Denies depression, anxiety, hallucinations   Objective:    Blood pressure 118/80, pulse 73, temperature 97.9 F (36.6 C), temperature source Oral, weight 193 lb (87.5 kg), SpO2 98 %.  Gen. Pleasant, well-nourished, in no distress, normal affect   HEENT: [place]/AT, face symmetric, conjunctiva clear, no scleral icterus, PERRLA, EOMI, nares patent without drainage Lungs: no accessory muscle use, CTAB, no wheezes or rales Cardiovascular: RRR, no m/r/g, no peripheral edema Abdomen: BS present, soft, NT/ND, no hepatosplenomegaly. Musculoskeletal: No deformities, no cyanosis or clubbing, normal tone Neuro:  A&Ox3, CN II-XII intact, normal gait Skin:  Warm, no lesions/ rash.  Onychomycosis of left great toenail, thickened, yellow color.  Round, mobile firm mass in palm of R hand at 3rd MCP jt.   Wt Readings from Last 3 Encounters:  07/16/20 193 lb (87.5 kg)  03/21/20 192 lb 3.2 oz (87.2 kg)  03/07/20  196 lb (88.9 kg)    Lab Results  Component Value Date   WBC 8.2 02/17/2020   HGB 15.4 02/17/2020   HCT 46.4 02/17/2020   PLT 286 02/17/2020   GLUCOSE 65 (L) 02/22/2020   CHOL 136 02/22/2020   TRIG 112.0 02/22/2020   HDL 45.90 02/22/2020   LDLCALC 68 02/22/2020   ALT 32 08/03/2018   AST 21 08/03/2018   NA 135 02/22/2020   K 4.8 02/22/2020   CL 99 02/22/2020   CREATININE 1.03 02/22/2020   BUN 12 02/22/2020   CO2 31 02/22/2020   TSH 2.06 03/10/2018   HGBA1C 5.9 08/03/2018    Assessment/Plan:  Ganglion cyst -recurrent  - Plan: Ambulatory referral to Hand Surgery  Erectile dysfunction,  unspecified erectile dysfunction type  - Plan: tadalafil (CIALIS) 5 MG tablet  Onychomycosis -discussed treatment options -pt to schedule f/u with Podiatry  Mixed hyperlipidemia -continue lifestyle modifications -crestor 20 mg daily -f/u with Cardiology  F/u prn in 2-3 months  Grier Mitts, MD

## 2020-07-30 ENCOUNTER — Other Ambulatory Visit: Payer: Self-pay | Admitting: Orthopedic Surgery

## 2020-07-30 DIAGNOSIS — R2230 Localized swelling, mass and lump, unspecified upper limb: Secondary | ICD-10-CM

## 2020-08-20 ENCOUNTER — Telehealth: Payer: Self-pay

## 2020-08-20 NOTE — Telephone Encounter (Signed)
Patient called stating he needs a refill on Rx amphetamine-dextroamphetamine (ADDERALL) 20 MG tablet

## 2020-09-06 NOTE — Telephone Encounter (Signed)
PT called to advise that they called in back then for a refill of their amphetamine-dextroamphetamine (ADDERALL) 20 MG tablet and are now out of it completely. They need it asap called in to the [facility] on file.

## 2020-09-07 ENCOUNTER — Other Ambulatory Visit: Payer: Self-pay | Admitting: Family Medicine

## 2020-09-07 DIAGNOSIS — F902 Attention-deficit hyperactivity disorder, combined type: Secondary | ICD-10-CM

## 2020-09-07 MED ORDER — AMPHETAMINE-DEXTROAMPHETAMINE 20 MG PO TABS: 20.0000 mg | ORAL_TABLET | Freq: Two times a day (BID) | ORAL | 0 refills | Status: DC

## 2020-09-14 ENCOUNTER — Ambulatory Visit
Admission: RE | Admit: 2020-09-14 | Discharge: 2020-09-14 | Disposition: A | Discharge status: Home / Self Care | Payer: Medicare Other | Source: Ambulatory Visit | Attending: Orthopedic Surgery | Admitting: Orthopedic Surgery

## 2020-09-14 DIAGNOSIS — R2230 Localized swelling, mass and lump, unspecified upper limb: Secondary | ICD-10-CM

## 2020-10-16 ENCOUNTER — Other Ambulatory Visit: Payer: Self-pay

## 2020-10-17 ENCOUNTER — Ambulatory Visit (INDEPENDENT_AMBULATORY_CARE_PROVIDER_SITE_OTHER): Payer: Medicare Other | Admitting: Family Medicine

## 2020-10-17 ENCOUNTER — Encounter: Payer: Self-pay | Admitting: Family Medicine

## 2020-10-17 VITALS — BP 122/76 | HR 73 | Temp 97.6°F | Wt 188.0 lb

## 2020-10-17 DIAGNOSIS — L821 Other seborrheic keratosis: Secondary | ICD-10-CM

## 2020-10-17 DIAGNOSIS — I2510 Atherosclerotic heart disease of native coronary artery without angina pectoris: Secondary | ICD-10-CM | POA: Diagnosis not present

## 2020-10-17 DIAGNOSIS — M6740 Ganglion, unspecified site: Secondary | ICD-10-CM

## 2020-10-17 DIAGNOSIS — Z23 Encounter for immunization: Secondary | ICD-10-CM | POA: Diagnosis not present

## 2020-10-17 DIAGNOSIS — N529 Male erectile dysfunction, unspecified: Secondary | ICD-10-CM | POA: Diagnosis not present

## 2020-10-17 DIAGNOSIS — F902 Attention-deficit hyperactivity disorder, combined type: Secondary | ICD-10-CM | POA: Diagnosis not present

## 2020-10-17 MED ORDER — AMPHETAMINE-DEXTROAMPHETAMINE 20 MG PO TABS: 20.0000 mg | ORAL_TABLET | Freq: Two times a day (BID) | ORAL | 0 refills | Status: DC

## 2020-10-17 MED ORDER — TADALAFIL 5 MG PO TABS: ORAL_TABLET | ORAL | 2 refills | Status: DC

## 2020-10-17 NOTE — Progress Notes (Signed)
Subjective:    Patient ID: Michael Durham, male    DOB: 1955-03-06, 65 y.o.   MRN: [id number]  Chief Complaint  Patient presents with   Follow-up    Ganglion cyst medication    HPI Patient was seen today for.  Patient states he has been doing well overall.  Staying active with projects, building a guitar, and playing the guitar.  Was seen by Ortho on 09/21/2020 for follow-up on ganglion cyst in palm of right hand.  Advised on surgery to have area removed, but hesitant as he was hoping he would not have to have surgery.  Taking Adderall 20 mg twice daily.  Denies changes in appetite, sleep, energy.  Also taking Abilify and Lexapro.  States mood is good.  Notes improvement in ED with Cialis.  Inquires about referral to dermatology to have skin check.  Typically is not out in the sun much.  Not currently wearing sunscreen.  Past Medical History:  Diagnosis Date   ADHD    Allergy    Anxiety    Bipolar 2 disorder ([place])    Depression    Diverticulosis    ED (erectile dysfunction)    Encephalitis, Age 55    HLD (hyperlipidemia)    Hx of colonic polyp - last colonoscopy with Dr. Alinda Sierras on 12/21/13 08/03/2018   Vitamin D deficiency     Allergies  Allergen Reactions   Wellbutrin [Bupropion] Other (See Comments)    Severe headache.   Tetanus Toxoids     "old horse serum preparation"    ROS General: Denies fever, chills, night sweats, changes in weight, changes in appetite HEENT: Denies headaches, ear pain, changes in vision, rhinorrhea, sore throat CV: Denies CP, palpitations, SOB, orthopnea Pulm: Denies SOB, cough, wheezing GI: Denies abdominal pain, nausea, vomiting, diarrhea, constipation GU: Denies dysuria, hematuria, frequency Msk: Denies muscle cramps, joint pains Neuro: Denies weakness, numbness, tingling Skin: Denies rashes, bruising  +bump in palm of R hand Psych: Denies depression, anxiety, hallucinations    Objective:    Blood pressure 122/76, pulse 73, temperature  97.6 F (36.4 C), temperature source Oral, weight 188 lb (85.3 kg), SpO2 99 %.  Gen. Pleasant, well-nourished, in no distress, flat affect   HEENT: [place]/AT, face symmetric, conjunctiva clear, no scleral icterus, PERRLA, EOMI, nares patent without drainage Lungs: no accessory muscle use, CTAB, no wheezes or rales Cardiovascular: RRR, no m/r/g, no peripheral edema Musculoskeletal: No deformities, no cyanosis or clubbing, normal tone Neuro:  A&Ox3, CN II-XII intact, normal gait Skin:  Warm, dry, intact, no rash.  Seborrheic keratosis on left temple.   Wt Readings from Last 3 Encounters:  10/17/20 188 lb (85.3 kg)  07/16/20 193 lb (87.5 kg)  03/21/20 192 lb 3.2 oz (87.2 kg)    Lab Results  Component Value Date   WBC 8.2 02/17/2020   HGB 15.4 02/17/2020   HCT 46.4 02/17/2020   PLT 286 02/17/2020   GLUCOSE 65 (L) 02/22/2020   CHOL 136 02/22/2020   TRIG 112.0 02/22/2020   HDL 45.90 02/22/2020   LDLCALC 68 02/22/2020   ALT 32 08/03/2018   AST 21 08/03/2018   NA 135 02/22/2020   K 4.8 02/22/2020   CL 99 02/22/2020   CREATININE 1.03 02/22/2020   BUN 12 02/22/2020   CO2 31 02/22/2020   TSH 2.06 03/10/2018   HGBA1C 5.9 08/03/2018    Assessment/Plan:  Attention deficit hyperactivity disorder (ADHD), combined type  -Stable -Continue Adderall 20 mg twice daily -Rx for 3  months to pharmacy. -PMDP reviewed -Obtain UDS - Plan: amphetamine-dextroamphetamine (ADDERALL) 20 MG tablet, amphetamine-dextroamphetamine (ADDERALL) 20 MG tablet, amphetamine-dextroamphetamine (ADDERALL) 20 MG tablet, 170017 11+Oxyco+Alc+Crt-Bund  Erectile dysfunction, unspecified erectile dysfunction type  - Plan: tadalafil (CIALIS) 5 MG tablet  Seborrheic keratosis -Left temple -Discussed wearing sunscreen daily -Given handout - Plan: Ambulatory referral to Dermatology  Ganglion cyst -In palm of right hand -Surgery advised to remove given discomfort.  Pt to decide -Continue follow-up with  Ortho  Need for influenza vaccination - Plan: Flu Vaccine QUAD High Dose(Fluad)  F/u in 3 months  Grier Mitts, MD

## 2020-10-17 NOTE — Addendum Note (Signed)
Addended by: Amanda Cockayne on: 10/17/2020 02:49 PM   Modules accepted: Orders

## 2020-10-17 NOTE — Patient Instructions (Signed)
Per chart review it was recommended that you come back in 1 year to have repeat colonoscopy done due to the prep.  You can contact the gastroenterology office to schedule this appointment.

## 2020-10-23 LAB — DRUG SCREEN 764883 11+OXYCO+ALC+CRT-BUND
BENZODIAZ UR QL: NEGATIVE ng/mL
Barbiturate: NEGATIVE ng/mL
Cocaine (Metabolite): NEGATIVE ng/mL
Creatinine: 98.7 mg/dL (ref 20.0–300.0)
Ethanol: NEGATIVE %
Meperidine: NEGATIVE ng/mL
Methadone Screen, Urine: NEGATIVE ng/mL
OPIATE SCREEN URINE: NEGATIVE ng/mL
Oxycodone/Oxymorphone, Urine: NEGATIVE ng/mL
Phencyclidine: NEGATIVE ng/mL
Propoxyphene: NEGATIVE ng/mL
Tramadol: NEGATIVE ng/mL
pH, Urine: 7.8 (ref 4.5–8.9)

## 2020-10-23 LAB — DRUG PROFILE 799016
Amphetamine GC/MS Conf: 2375 ng/mL
Amphetamine: POSITIVE — AB
Amphetamines: POSITIVE — AB
Methamphetamine: NEGATIVE

## 2020-10-23 LAB — CANNABINOID CONFIRMATION, UR
CANNABINOIDS: POSITIVE — AB
Carboxy THC GC/MS Conf: >750 ng/mL

## 2021-01-09 ENCOUNTER — Encounter: Payer: Self-pay | Admitting: Family Medicine

## 2021-01-09 NOTE — Telephone Encounter (Signed)
Noted  

## 2021-01-16 ENCOUNTER — Ambulatory Visit (INDEPENDENT_AMBULATORY_CARE_PROVIDER_SITE_OTHER): Payer: Medicare Other

## 2021-01-16 ENCOUNTER — Ambulatory Visit (INDEPENDENT_AMBULATORY_CARE_PROVIDER_SITE_OTHER): Payer: Medicare Other | Admitting: Podiatry

## 2021-01-16 ENCOUNTER — Other Ambulatory Visit: Payer: Self-pay

## 2021-01-16 ENCOUNTER — Encounter: Payer: Self-pay | Admitting: Podiatry

## 2021-01-16 DIAGNOSIS — L600 Ingrowing nail: Secondary | ICD-10-CM | POA: Diagnosis not present

## 2021-01-16 DIAGNOSIS — M2021 Hallux rigidus, right foot: Secondary | ICD-10-CM

## 2021-01-16 NOTE — Patient Instructions (Signed)

## 2021-01-16 NOTE — Progress Notes (Signed)
Subjective:   Patient ID: Tenna Delaine, male   DOB: 66 y.o.   MRN: [id number]   HPI Patient states he seems to be doing pretty well with the surgery on his right foot but wanted that checked and has nail deformity of the hallux bilateral with thick humped of nail left big toenail and the right showing reoccurrence on the lateral side   ROS      Objective:  Physical Exam  Neurovascular status intact with good range of motion first MPJ right no crepitus of the joint with severe damage left hallux nail and moderate on the right hallux lateral side with pain     Assessment:  Doing well after having implant arthroplasty right first MPJ with damage hallux nail bilateral     Plan:  H&P reviewed both conditions and for the hallux I am satisfied and will continue conservative types of shoe gear and recommended nail removal allowing him to read and signed consent form understanding risk.  I infiltrated each hallux 60 mg like Marcaine mixture sterile prep done and using sterile instrumentation remove the hallux nails exposed matrix applied phenol 5 applications left for applications right followed by alcohol lavage sterile dressing gave instructions on soaks and leave dressings on 24 hours but take them off earlier if throbbing were to occur and encouraged to call questions concerns  X-rays indicate implant is in place no signs of pathology associated with this so far

## 2021-01-17 ENCOUNTER — Ambulatory Visit: Payer: Medicare Other | Admitting: Family Medicine

## 2021-01-18 ENCOUNTER — Encounter: Payer: Self-pay | Admitting: Family Medicine

## 2021-01-18 ENCOUNTER — Ambulatory Visit (INDEPENDENT_AMBULATORY_CARE_PROVIDER_SITE_OTHER): Payer: Medicare Other | Admitting: Family Medicine

## 2021-01-18 VITALS — BP 124/82 | HR 78 | Temp 98.0°F | Wt 180.4 lb

## 2021-01-18 DIAGNOSIS — M791 Myalgia, unspecified site: Secondary | ICD-10-CM

## 2021-01-18 DIAGNOSIS — F902 Attention-deficit hyperactivity disorder, combined type: Secondary | ICD-10-CM

## 2021-01-18 DIAGNOSIS — J342 Deviated nasal septum: Secondary | ICD-10-CM | POA: Diagnosis not present

## 2021-01-18 DIAGNOSIS — R400 Somnolence: Secondary | ICD-10-CM | POA: Diagnosis not present

## 2021-01-18 DIAGNOSIS — R0981 Nasal congestion: Secondary | ICD-10-CM | POA: Diagnosis not present

## 2021-01-18 DIAGNOSIS — R5383 Other fatigue: Secondary | ICD-10-CM

## 2021-01-18 DIAGNOSIS — T466X5A Adverse effect of antihyperlipidemic and antiarteriosclerotic drugs, initial encounter: Secondary | ICD-10-CM

## 2021-01-18 MED ORDER — AMPHETAMINE-DEXTROAMPHETAMINE 20 MG PO TABS: 20.0000 mg | ORAL_TABLET | Freq: Two times a day (BID) | ORAL | 0 refills | Status: DC

## 2021-01-18 NOTE — Progress Notes (Signed)
Subjective:    Patient ID: Michael Durham, male    DOB: January 16, 1955, 66 y.o.   MRN: [id number]  Chief Complaint  Patient presents with   ADHD    Also has other concerns    HPI Patient was seen today for follow-up and med refill as well as acute concern.  Pt stopped Crestor 20 mg in December 2/2 myalgia.  Right arm pain now resolved.  Patient endorses daytime somnolence despite drinking 2 cups of coffee in am and Adderall use.  Taking naps throughout the day and going to bed around 9 PM.  Patient has been told he snores at night but thought it had improved some.  "Feeling bad" for the past 2 days w/ nasal congestion, increased fatigue, feeling like left nostril is clogged and brief nausea.  Patient denies headache, sore throat, cough, ear pain/pressure, emesis, diarrhea.  Home COVID test negative yesterday.  Tried NyQuil.  Pt notes cutting left thenar eminence on a piece of glass several months ago.  Cleaned wound at home which has since healed but has felt something firm in his hand.  Pt had wisdom teeth removed last wk and b/l great toenail removal for ingrown toenails.  Past Medical History:  Diagnosis Date   ADHD    Allergy    Anxiety    Bipolar 2 disorder ([place])    Depression    Diverticulosis    ED (erectile dysfunction)    Encephalitis, Age 69    HLD (hyperlipidemia)    Hx of colonic polyp - last colonoscopy with Dr. Alinda Sierras on 12/21/13 08/03/2018   Vitamin D deficiency     Allergies  Allergen Reactions   Wellbutrin [Bupropion] Other (See Comments)    Severe headache.   Tetanus Toxoids     "old horse serum preparation"    ROS General: Denies fever, chills, night sweats, changes in weight, changes in appetite  +fatigue, daytime somnolence. HEENT: Denies headaches, ear pain, changes in vision, rhinorrhea, sore throat +nasal congestion CV: Denies CP, palpitations, SOB, orthopnea Pulm: Denies SOB, cough, wheezing GI: Denies abdominal pain, nausea, vomiting, diarrhea,  constipation GU: Denies dysuria, hematuria, frequency Msk: Denies muscle cramps, joint pains  +myalgia 2/2 statin Neuro: Denies weakness, numbness, tingling Skin: Denies rashes, bruising  +bump in L palm Psych: Denies depression, anxiety, hallucinations    Objective:    Blood pressure 124/82, pulse 78, temperature 98 F (36.7 C), temperature source Oral, weight 180 lb 6.4 oz (81.8 kg), SpO2 99 %.  Gen. Pleasant, well-nourished, appears fatigued,  no distress, normal affect   HEENT: [place]/AT, face symmetric, conjunctiva clear, bags underneath eyes, no scleral icterus, PERRLA, EOMI, nares patent without drainage, pharynx without erythema or exudate.  TMs normal b/l.  No cervical lymphadenopathy. Lungs: no accessory muscle use, CTAB, no wheezes or rales Cardiovascular: RRR, no m/r/g, no peripheral edema Musculoskeletal: No deformities, no cyanosis or clubbing, normal tone Neuro:  A&Ox3, CN II-XII intact, normal gait Skin:  Warm, no lesions/ rash.  No palpable mass appreciated in L thenar eminence, no TTP.   Wt Readings from Last 3 Encounters:  01/18/21 180 lb 6.4 oz (81.8 kg)  10/17/20 188 lb (85.3 kg)  07/16/20 193 lb (87.5 kg)    Lab Results  Component Value Date   WBC 8.2 02/17/2020   HGB 15.4 02/17/2020   HCT 46.4 02/17/2020   PLT 286 02/17/2020   GLUCOSE 65 (L) 02/22/2020   CHOL 136 02/22/2020   TRIG 112.0 02/22/2020   HDL 45.90 02/22/2020  LDLCALC 68 02/22/2020   ALT 32 08/03/2018   AST 21 08/03/2018   NA 135 02/22/2020   K 4.8 02/22/2020   CL 99 02/22/2020   CREATININE 1.03 02/22/2020   BUN 12 02/22/2020   CO2 31 02/22/2020   TSH 2.06 03/10/2018   HGBA1C 5.9 08/03/2018   Depression screen PHQ 2/9 01/18/2021 10/17/2020 02/13/2020  Decreased Interest 1 1 1   Down, Depressed, Hopeless 1 0 0  PHQ - 2 Score 2 1 1   Altered sleeping 3 1 2   Tired, decreased energy 2 0 2  Change in appetite 0 2 0  Feeling bad or failure about yourself  0 0 0  Trouble concentrating 0 1 0   Moving slowly or fidgety/restless 0 1 0  Suicidal thoughts 0 0 0  PHQ-9 Score 7 6 5   Difficult doing work/chores - - Somewhat difficult  Some recent data might be hidden    Assessment/Plan:  Daytime somnolence -Discussed possible causes including OSA, medications, increased depression symptoms -PHQ 9 score 7  - Plan: Ambulatory referral to Sleep Studies  Other fatigue  -Causes may be compounded 2/2 current viral illness - Plan: Ambulatory referral to Sleep Studies  Nasal congestion -Over-the-counter decongestant, saline nasal rinse/Flonase, antihistamine, steam from shower -Repeat COVID testing  Deviated septum -chronic, stable -ENT f/u for increased symptoms  Myalgia due to statin -crestor 40 mg daily cause R arm pain -discussed switching meds vs other alternatives such as fish oil tabs.  Will try taking Crestor 20 mg twice a wk.  If myalgias return can d/c med. -Lifestyle modifications  Attention deficit hyperactivity disorder (ADHD), combined type  -stable -will send in rx for 90 day supply to see if it is cheaper.  In not will resume monthly rx's x 3. -pdmp reviewed - Plan: amphetamine-dextroamphetamine (ADDERALL) 20 MG tablet  F/u as needed in the next few months.  3 months for ADHD.  More than 50% of over 31-33 minutes spent in total in caring for this patient was spent face-to-face, reviewing the chart, counseling and/or coordinating care.    Grier Mitts, MD

## 2021-01-22 ENCOUNTER — Encounter: Payer: Self-pay | Admitting: Family Medicine

## 2021-01-22 DIAGNOSIS — Z1211 Encounter for screening for malignant neoplasm of colon: Secondary | ICD-10-CM

## 2021-01-28 ENCOUNTER — Encounter: Payer: Self-pay | Admitting: Family Medicine

## 2021-01-31 ENCOUNTER — Encounter: Payer: Self-pay | Admitting: Family Medicine

## 2021-02-04 ENCOUNTER — Encounter: Payer: Self-pay | Admitting: Family Medicine

## 2021-02-11 ENCOUNTER — Telehealth: Payer: Self-pay | Admitting: Family Medicine

## 2021-02-11 NOTE — Telephone Encounter (Signed)
Left message for patient to call back and schedule Medicare Annual Wellness Visit (AWV) either virtually or in office. Left  my Herbie Drape number [phone]   Last AWV 02/13/20  please schedule at anytime with [facility] Nurse Health Advisor 1 or 2   This should be a 45 minute visit.

## 2021-04-05 ENCOUNTER — Telehealth: Payer: Self-pay | Admitting: Family Medicine

## 2021-04-05 NOTE — Telephone Encounter (Signed)
Left message for patient to call back and schedule Medicare Annual Wellness Visit (AWV) either virtually or in office. Left  my Herbie Drape number [phone] ? ? ?Last AWV 02/13/20 ? please schedule at anytime with [facility] Nurse Health Advisor 1 or 2 ? ? ? ?

## 2021-04-08 ENCOUNTER — Other Ambulatory Visit: Payer: Self-pay | Admitting: Family Medicine

## 2021-04-08 DIAGNOSIS — F3181 Bipolar II disorder: Secondary | ICD-10-CM

## 2021-04-08 NOTE — Telephone Encounter (Signed)
LM on vmail to call back to schedule 

## 2021-04-18 ENCOUNTER — Encounter: Payer: Self-pay | Admitting: Family Medicine

## 2021-04-18 ENCOUNTER — Ambulatory Visit (INDEPENDENT_AMBULATORY_CARE_PROVIDER_SITE_OTHER): Payer: Medicare Other | Admitting: Family Medicine

## 2021-04-18 VITALS — BP 137/81 | HR 60 | Temp 98.3°F | Wt 188.8 lb

## 2021-04-18 DIAGNOSIS — E782 Mixed hyperlipidemia: Secondary | ICD-10-CM

## 2021-04-18 DIAGNOSIS — J342 Deviated nasal septum: Secondary | ICD-10-CM | POA: Diagnosis not present

## 2021-04-18 DIAGNOSIS — R0981 Nasal congestion: Secondary | ICD-10-CM | POA: Diagnosis not present

## 2021-04-18 DIAGNOSIS — F902 Attention-deficit hyperactivity disorder, combined type: Secondary | ICD-10-CM | POA: Diagnosis not present

## 2021-04-18 MED ORDER — ROSUVASTATIN CALCIUM 10 MG PO TABS: 10.0000 mg | ORAL_TABLET | ORAL | 3 refills | Status: DC

## 2021-04-18 MED ORDER — AMPHETAMINE-DEXTROAMPHETAMINE 20 MG PO TABS: 20.0000 mg | ORAL_TABLET | Freq: Two times a day (BID) | ORAL | 0 refills | Status: DC

## 2021-04-18 NOTE — Progress Notes (Signed)
Subjective:  ? ? Patient ID: Michael Durham, male    DOB: 09-20-1955, 66 y.o.   MRN: [id number] ? ?Chief Complaint  ?Patient presents with  ? Follow-up  ?  ADHD, fatigue, daytime somnolence  ? ? ?HPI ?Patient was seen today for follow-up on ADHD.  Patient states he is doing well on Adderall.  Had difficulty obtaining medication from [facility].  States took about 3 weeks before medication was in stock.  Patient states sleep is good, has no trouble falling asleep.  May take a nap during the day.  Energy and concentration are okay.  Patient inquires about a low-dose daily statin.  Currently taking half a tab Crestor 20 mg 2 times per week but may forget one of the doses.  Also notes difficulty cutting pill with pill cutter.  Pt with continued right-sided nasal congestion due to history of deviated septum and sinus surgery years ago.  Patient states nasal sprays/rinses have been ineffective. ? ?Inquires if there is a different option for colonoscopy prep.  States was unable to drink all the prep last time as it made him feel nauseous. ? ?Past Medical History:  ?Diagnosis Date  ? ADHD   ? Allergy   ? Anxiety   ? Bipolar 2 disorder ([place])   ? Depression   ? Diverticulosis   ? ED (erectile dysfunction)   ? Encephalitis, Age 12   ? HLD (hyperlipidemia)   ? Hx of colonic polyp - last colonoscopy with Dr. Alinda Sierras on 12/21/13 08/03/2018  ? Vitamin D deficiency   ? ? ?Allergies  ?Allergen Reactions  ? Wellbutrin [Bupropion] Other (See Comments)  ?  Severe headache.  ? Tetanus Toxoids   ?  "old horse serum preparation"  ? ? ?ROS ?General: Denies fever, chills, night sweats, changes in weight, changes in appetite ?HEENT: Denies headaches, ear pain, changes in vision, rhinorrhea, sore throat + right-sided nasal congestion ?CV: Denies CP, palpitations, SOB, orthopnea ?Pulm: Denies SOB, cough, wheezing ?GI: Denies abdominal pain, nausea, vomiting, diarrhea, constipation ?GU: Denies dysuria, hematuria, frequency ?Msk: Denies  muscle cramps, joint pains ?Neuro: Denies weakness, numbness, tingling ?Skin: Denies rashes, bruising ?Psych: Denies depression, anxiety, hallucinations ?   ?Objective:  ?  ?Blood pressure 137/81, pulse 60, temperature 98.3 ?F (36.8 ?C), temperature source Oral, weight 188 lb 12.8 oz (85.6 kg), SpO2 100 %. ? ? ?Gen. Pleasant, well-nourished, in no distress, normal affect   ?HEENT: [place]/AT, face symmetric, conjunctiva clear, no scleral icterus, PERRLA, EOMI, nares patent L>R, deviated septum, no drainage, pharynx without erythema or exudate. ?Lungs: no accessory muscle use, CTAB, no wheezes or rales ?Cardiovascular: RRR, no m/r/g, no peripheral edema ?Musculoskeletal: No deformities, no cyanosis or clubbing, normal tone ?Neuro:  A&Ox3, CN II-XII intact, normal gait ?Skin:  Warm, no lesions/ rash ? ? ?Wt Readings from Last 3 Encounters:  ?04/18/21 188 lb 12.8 oz (85.6 kg)  ?01/18/21 180 lb 6.4 oz (81.8 kg)  ?10/17/20 188 lb (85.3 kg)  ? ? ?Lab Results  ?Component Value Date  ? WBC 8.2 02/17/2020  ? HGB 15.4 02/17/2020  ? HCT 46.4 02/17/2020  ? PLT 286 02/17/2020  ? GLUCOSE 65 (L) 02/22/2020  ? CHOL 136 02/22/2020  ? TRIG 112.0 02/22/2020  ? HDL 45.90 02/22/2020  ? [place] 68 02/22/2020  ? ALT 32 08/03/2018  ? AST 21 08/03/2018  ? NA 135 02/22/2020  ? K 4.8 02/22/2020  ? CL 99 02/22/2020  ? CREATININE 1.03 02/22/2020  ? BUN 12 02/22/2020  ?  CO2 31 02/22/2020  ? TSH 2.06 03/10/2018  ? HGBA1C 5.9 08/03/2018  ? ? ?Assessment/Plan: ? ?Mixed hyperlipidemia  ?-continue  lifestyle modifications ?-continue crestor 10 mg twice a wk ?-Plan: rosuvastatin (CRESTOR) 10 MG tablet ? ?Attention deficit hyperactivity disorder (ADHD), combined type ?-stable ?-PMDP reviewed ?-continue Adderall XL 20 mg daily ?- Plan: amphetamine-dextroamphetamine (ADDERALL) 20 MG tablet, amphetamine-dextroamphetamine (ADDERALL) 20 MG tablet, amphetamine-dextroamphetamine (ADDERALL) 20 MG tablet ? ?Deviated septum  ?- Plan: Ambulatory referral to  ENT ? ?Nasal congestion ?-Right-sided nasal congestion s/p sinus surgery ? - Plan: Ambulatory referral to ENT ? ?F/u in 3 months ? ?Grier Mitts, MD ?

## 2021-04-29 ENCOUNTER — Ambulatory Visit (INDEPENDENT_AMBULATORY_CARE_PROVIDER_SITE_OTHER): Payer: Medicare Other

## 2021-04-29 VITALS — Ht 70.0 in | Wt 180.0 lb

## 2021-04-29 DIAGNOSIS — Z0000 Encounter for general adult medical examination without abnormal findings: Secondary | ICD-10-CM | POA: Diagnosis not present

## 2021-04-29 NOTE — Patient Instructions (Signed)
Mr. Fury , ?Thank you for taking time to come for your Medicare Wellness Visit. I appreciate your ongoing commitment to your health goals. Please review the following plan we discussed and let me know if I can assist you in the future.  ? ?Screening recommendations/referrals: ?Colonoscopy: scheduled for 06/04/2021 ?Recommended yearly ophthalmology/optometry visit for glaucoma screening and checkup ?Recommended yearly dental visit for hygiene and checkup ? ?Vaccinations: ?Influenza vaccine: due 08/06/2021 ?Pneumococcal vaccine: completed 08/03/2018 ?Tdap vaccine: completed 12/07/2015, due 12/06/2025 ?Shingles vaccine: discussed   ?Covid-19:  10/23/2020, 10/31/2019, 03/09/2019, 02/16/2019 ? ?Advanced directives: copy in chart ? ?Conditions/risks identified: none ? ?Next appointment: Follow up in one year for your annual wellness visit.  ? ?Preventive Care 18 Years and Older, Male ?Preventive care refers to lifestyle choices and visits with your health care provider that can promote health and wellness. ?What does preventive care include? ?A yearly physical exam. This is also called an annual well check. ?Dental exams once or twice a year. ?Routine eye exams. Ask your health care provider how often you should have your eyes checked. ?Personal lifestyle choices, including: ?Daily care of your teeth and gums. ?Regular physical activity. ?Eating a healthy diet. ?Avoiding tobacco and drug use. ?Limiting alcohol use. ?Practicing safe sex. ?Taking low doses of aspirin every day. ?Taking vitamin and mineral supplements as recommended by your health care provider. ?What happens during an annual well check? ?The services and screenings done by your health care provider during your annual well check will depend on your age, overall health, lifestyle risk factors, and family history of disease. ?Counseling  ?Your health care provider may ask you questions about your: ?Alcohol use. ?Tobacco use. ?Drug use. ?Emotional well-being. ?Home  and relationship well-being. ?Sexual activity. ?Eating habits. ?History of falls. ?Memory and ability to understand (cognition). ?Work and work Statistician. ?Screening  ?You may have the following tests or measurements: ?Height, weight, and BMI. ?Blood pressure. ?Lipid and cholesterol levels. These may be checked every 5 years, or more frequently if you are over 59 years old. ?Skin check. ?Lung cancer screening. You may have this screening every year starting at age 35 if you have a 30-pack-year history of smoking and currently smoke or have quit within the past 15 years. ?Fecal occult blood test (FOBT) of the stool. You may have this test every year starting at age 40. ?Flexible sigmoidoscopy or colonoscopy. You may have a sigmoidoscopy every 5 years or a colonoscopy every 10 years starting at age 42. ?Prostate cancer screening. Recommendations will vary depending on your family history and other risks. ?Hepatitis C blood test. ?Hepatitis B blood test. ?Sexually transmitted disease (STD) testing. ?Diabetes screening. This is done by checking your blood sugar (glucose) after you have not eaten for a while (fasting). You may have this done every 1-3 years. ?Abdominal aortic aneurysm (AAA) screening. You may need this if you are a current or former smoker. ?Osteoporosis. You may be screened starting at age 38 if you are at high risk. ?Talk with your health care provider about your test results, treatment options, and if necessary, the need for more tests. ?Vaccines  ?Your health care provider may recommend certain vaccines, such as: ?Influenza vaccine. This is recommended every year. ?Tetanus, diphtheria, and acellular pertussis (Tdap, Td) vaccine. You may need a Td booster every 10 years. ?Zoster vaccine. You may need this after age 32. ?Pneumococcal 13-valent conjugate (PCV13) vaccine. One dose is recommended after age 5. ?Pneumococcal polysaccharide (PPSV23) vaccine. One dose is recommended after age  12. ?Talk to  your health care provider about which screenings and vaccines you need and how often you need them. ?This information is not intended to replace advice given to you by your health care provider. Make sure you discuss any questions you have with your health care provider. ?Document Released: 01/19/2015 Document Revised: 09/12/2015 Document Reviewed: 10/24/2014 ?Elsevier Interactive Patient Education ? 2017 [place]. ? ?Fall Prevention in the Home ?Falls can cause injuries. They can happen to people of all ages. There are many things you can do to make your home safe and to help prevent falls. ?What can I do on the outside of my home? ?Regularly fix the edges of walkways and driveways and fix any cracks. ?Remove anything that might make you trip as you walk through a door, such as a raised step or threshold. ?Trim any bushes or trees on the path to your home. ?Use bright outdoor lighting. ?Clear any walking paths of anything that might make someone trip, such as rocks or tools. ?Regularly check to see if handrails are loose or broken. Make sure that both sides of any steps have handrails. ?Any raised decks and porches should have guardrails on the edges. ?Have any leaves, snow, or ice cleared regularly. ?Use sand or salt on walking paths during winter. ?Clean up any spills in your garage right away. This includes oil or grease spills. ?What can I do in the bathroom? ?Use night lights. ?Install grab bars by the toilet and in the tub and shower. Do not use towel bars as grab bars. ?Use non-skid mats or decals in the tub or shower. ?If you need to sit down in the shower, use a plastic, non-slip stool. ?Keep the floor dry. Clean up any water that spills on the floor as soon as it happens. ?Remove soap buildup in the tub or shower regularly. ?Attach bath mats securely with double-sided non-slip rug tape. ?Do not have throw rugs and other things on the floor that can make you trip. ?What can I do in the bedroom? ?Use  night lights. ?Make sure that you have a light by your bed that is easy to reach. ?Do not use any sheets or blankets that are too big for your bed. They should not hang down onto the floor. ?Have a firm chair that has side arms. You can use this for support while you get dressed. ?Do not have throw rugs and other things on the floor that can make you trip. ?What can I do in the kitchen? ?Clean up any spills right away. ?Avoid walking on wet floors. ?Keep items that you use a lot in easy-to-reach places. ?If you need to reach something above you, use a strong step stool that has a grab bar. ?Keep electrical cords out of the way. ?Do not use floor polish or wax that makes floors slippery. If you must use wax, use non-skid floor wax. ?Do not have throw rugs and other things on the floor that can make you trip. ?What can I do with my stairs? ?Do not leave any items on the stairs. ?Make sure that there are handrails on both sides of the stairs and use them. Fix handrails that are broken or loose. Make sure that handrails are as long as the stairways. ?Check any carpeting to make sure that it is firmly attached to the stairs. Fix any carpet that is loose or worn. ?Avoid having throw rugs at the top or bottom of the stairs. If you do have  throw rugs, attach them to the floor with carpet tape. ?Make sure that you have a light switch at the top of the stairs and the bottom of the stairs. If you do not have them, ask someone to add them for you. ?What else can I do to help prevent falls? ?Wear shoes that: ?Do not have high heels. ?Have rubber bottoms. ?Are comfortable and fit you well. ?Are closed at the toe. Do not wear sandals. ?If you use a stepladder: ?Make sure that it is fully opened. Do not climb a closed stepladder. ?Make sure that both sides of the stepladder are locked into place. ?Ask someone to hold it for you, if possible. ?Clearly mark and make sure that you can see: ?Any grab bars or handrails. ?First and last  steps. ?Where the edge of each step is. ?Use tools that help you move around (mobility aids) if they are needed. These include: ?Canes. ?Walkers. ?Scooters. ?Crutches. ?Turn on the lights when you go in

## 2021-04-29 NOTE — Progress Notes (Signed)
?I connected with Kandis Mannan today by telephone and verified that I am speaking with the correct person using two identifiers. ?Location patient: home ?Location provider: work ?Persons participating in the virtual visit: Leeland, Lovelady LPN. ?  ?I discussed the limitations, risks, security and privacy concerns of performing an evaluation and management service by telephone and the availability of in person appointments. I also discussed with the patient that there may be a patient responsible charge related to this service. The patient expressed understanding and verbally consented to this telephonic visit.  ?  ?Interactive audio and video telecommunications were attempted between this provider and patient, however failed, due to patient having technical difficulties OR patient did not have access to video capability.  We continued and completed visit with audio only. ? ?  ? ?Vital signs may be patient reported or missing. ? ?Subjective:  ? Michael Durham is a 66 y.o. male who presents for Medicare Annual/Subsequent preventive examination. ? ?Review of Systems    ? ?Cardiac Risk Factors include: advanced age (>51mn, >>68women);dyslipidemia;male gender ? ?   ?Objective:  ?  ?Today's Vitals  ? 04/29/21 0845  ?Weight: 180 lb (81.6 kg)  ?Height: '5\' 10"'$  (1.778 m)  ? ?Body mass index is 25.83 kg/m?. ? ? ?  04/29/2021  ?  8:50 AM 02/13/2020  ?  9:15 AM  ?Advanced Directives  ?Does Patient Have a Medical Advance Directive? Yes Yes  ?Type of AParamedicof [place]Living will [place] will  ?Copy of [place] Chart? Yes - validated most recent copy scanned in chart (See row information) Yes - validated most recent copy scanned in chart (See row information)  ? ? ?Current Medications (verified) ?Outpatient Encounter Medications as of 04/29/2021  ?Medication Sig  ? amphetamine-dextroamphetamine (ADDERALL) 20 MG tablet Take 1 tablet (20  mg total) by mouth 2 (two) times daily.  ? ARIPiprazole (ABILIFY) 5 MG tablet TAKE 1 TABLET BY MOUTH AT BEDTIME  ? aspirin EC 81 MG tablet Take 1 tablet (81 mg total) by mouth daily. Swallow whole.  ? Cholecalciferol (VITAMIN D) 2000 units CAPS Take 2,000 Units by mouth daily.  ? escitalopram (LEXAPRO) 20 MG tablet Take 1 tablet by mouth once daily  ? latanoprost (XALATAN) 0.005 % ophthalmic solution INSTILL 1 DROP INTO EACH EYE AT BEDTIME  ? rosuvastatin (CRESTOR) 10 MG tablet Take 1 tablet (10 mg total) by mouth 2 (two) times a week.  ? tadalafil (CIALIS) 5 MG tablet 1-3 tabs po daily as needed.  ? amphetamine-dextroamphetamine (ADDERALL) 20 MG tablet Take 1 tablet (20 mg total) by mouth 2 (two) times daily.  ? amphetamine-dextroamphetamine (ADDERALL) 20 MG tablet Take 1 tablet (20 mg total) by mouth 2 (two) times daily.  ? ?No facility-administered encounter medications on file as of 04/29/2021.  ? ? ?Allergies (verified) ?Wellbutrin [bupropion] and Tetanus toxoids  ? ?History: ?Past Medical History:  ?Diagnosis Date  ? ADHD   ? Allergy   ? Anxiety   ? Bipolar 2 disorder (HAnthem   ? Depression   ? Diverticulosis   ? ED (erectile dysfunction)   ? Encephalitis, Age 66  ? HLD (hyperlipidemia)   ? Hx of colonic polyp - last colonoscopy with Dr. NAlinda Sierrason 12/21/13 08/03/2018  ? Vitamin D deficiency   ? ?Past Surgical History:  ?Procedure Laterality Date  ? BRAIN SURGERY    ? COLONOSCOPY    ? MOUTH SURGERY    ? ?  Family History  ?Problem Relation Age of Onset  ? Prostate cancer Father   ? Bladder Cancer Father   ? Diabetes Brother   ? Dementia Mother   ? Breast cancer Maternal Aunt   ? Colon cancer Neg Hx   ? Esophageal cancer Neg Hx   ? Stomach cancer Neg Hx   ? Pancreatic cancer Neg Hx   ? Inflammatory bowel disease Neg Hx   ? Liver disease Neg Hx   ? Rectal cancer Neg Hx   ? ?Social History  ? ?Socioeconomic History  ? Marital status: Married  ?  Spouse name: Not on file  ? Number of children: Not on file  ? Years of  education: Not on file  ? Highest education level: Not on file  ?Occupational History  ? Not on file  ?Tobacco Use  ? Smoking status: Former  ? Smokeless tobacco: Never  ? Tobacco comments:  ?  Quit > 15 years ago, smoked 1-2 ppd for 30 years  ?Vaping Use  ? Vaping Use: Never used  ?Substance and Sexual Activity  ? Alcohol use: Yes  ?  Comment: occasionally  ? Drug use: No  ? Sexual activity: Yes  ?  Partners: Female  ?  Birth control/protection: None  ?Other Topics Concern  ? Not on file  ?Social History Narrative  ? Not on file  ? ?Social Determinants of Health  ? ?Financial Resource Strain: Low Risk   ? Difficulty of Paying Living Expenses: Not hard at all  ?Food Insecurity: No Food Insecurity  ? Worried About Charity fundraiser in the Last Year: Never true  ? Ran Out of Food in the Last Year: Never true  ?Transportation Needs: No Transportation Needs  ? Lack of Transportation (Medical): No  ? Lack of Transportation (Non-Medical): No  ?Physical Activity: Inactive  ? Days of Exercise per Week: 0 days  ? Minutes of Exercise per Session: 0 min  ?Stress: No Stress Concern Present  ? Feeling of Stress : Not at all  ?Social Connections: Not on file  ? ? ?Tobacco Counseling ?Counseling given: Not Answered ?Tobacco comments: Quit > 15 years ago, smoked 1-2 ppd for 30 years ? ? ?Clinical Intake: ? ?Pre-visit preparation completed: Yes ? ?Pain : No/denies pain ? ?  ? ?Nutritional Status: BMI 25 -29 Overweight ?Nutritional Risks: None ?Diabetes: No ? ?How often do you need to have someone help you when you read instructions, pamphlets, or other written materials from your doctor or pharmacy?: 1 - Never ?What is the last grade level you completed in school?: some college ? ?Diabetic? no ? ?Interpreter Needed?: No ? ?Information entered by :: NAllen LPN ? ? ?Activities of Daily Living ? ?  04/29/2021  ?  8:52 AM  ?In your present state of health, do you have any difficulty performing the following activities:  ?Hearing? 0   ?Vision? 0  ?Difficulty concentrating or making decisions? 0  ?Walking or climbing stairs? 0  ?Dressing or bathing? 0  ?Doing errands, shopping? 0  ?Preparing Food and eating ? N  ?Using the Toilet? N  ?In the past six months, have you accidently leaked urine? N  ?Do you have problems with loss of bowel control? N  ?Managing your Medications? N  ?Managing your Finances? N  ?Housekeeping or managing your Housekeeping? N  ? ? ?Patient Care Team: ?Billie Ruddy, MD as PCP - General (Family Medicine) ? ?Indicate any recent Medical Services you may have received from  other than [facility] providers in the past year (date may be approximate). ? ?   ?Assessment:  ? This is a routine wellness examination for Michael Durham. ? ?Hearing/Vision screen ?Vision Screening - Comments:: Regular eye exams, Dr. Roderic Palau ? ?Dietary issues and exercise activities discussed: ?Current Exercise Habits: The patient does not participate in regular exercise at present ? ? Goals Addressed   ? ?  ?  ?  ?  ? This Visit's Progress  ?  Patient Stated     ?  04/29/2021, no goals ?  ? ?  ? ?Depression Screen ? ?  04/29/2021  ?  8:51 AM 04/18/2021  ? 10:45 AM 01/18/2021  ? 11:45 AM 10/17/2020  ?  9:02 AM 02/13/2020  ?  9:17 AM 11/25/2018  ?  4:26 PM 08/03/2018  ? 12:11 PM  ?PHQ 2/9 Scores  ?PHQ - 2 Score 0 '1 2 1 1 2 1  '$ ?PHQ- 9 Score  '4 7 6 5 7 5  '$ ?  ?Fall Risk ? ?  04/29/2021  ?  8:51 AM 04/18/2021  ? 10:44 AM 01/18/2021  ? 11:45 AM 02/13/2020  ?  9:17 AM 08/03/2018  ? 10:42 AM  ?Fall Risk   ?Falls in the past year? 1 1 0 0 0  ?Comment tripped on side walk      ?Number falls in past yr: 1 0  0 0  ?Injury with Fall? 0 0  0 0  ?Risk for fall due to : Medication side effect Other (Comment)  No Fall Risks   ?Follow up Falls evaluation completed;Education provided;Falls prevention discussed Falls evaluation completed  Falls evaluation completed;Falls prevention discussed   ? ? ?FALL RISK PREVENTION PERTAINING TO THE HOME: ? ?Any stairs in or around the home? No  ?If so, are  there any without handrails? N/a ?Home free of loose throw rugs in walkways, pet beds, electrical cords, etc? Yes  ?Adequate lighting in your home to reduce risk of falls? Yes  ? ?ASSISTIVE DEVICES UTILIZED TO

## 2021-05-07 ENCOUNTER — Other Ambulatory Visit: Payer: Self-pay | Admitting: Gastroenterology

## 2021-05-07 ENCOUNTER — Ambulatory Visit (AMBULATORY_SURGERY_CENTER): Payer: Medicare Other | Admitting: *Deleted

## 2021-05-07 VITALS — Ht 70.0 in | Wt 185.0 lb

## 2021-05-07 DIAGNOSIS — Z86010 Personal history of colonic polyps: Secondary | ICD-10-CM

## 2021-05-07 MED ORDER — SUTAB 1479-225-188 MG PO TABS: 1.0000 | ORAL_TABLET | ORAL | 0 refills | Status: DC

## 2021-05-07 NOTE — Progress Notes (Signed)
No egg or soy allergy known to patient  ?No issues known to pt with past sedation with any surgeries or procedures ?Patient denies ever being told they had issues or difficulty with intubation  ?No FH of Malignant Hyperthermia ?Pt is not on diet pills ?Pt is not on  home 02  ?Pt is not on blood thinners  ?Pt's last prep only fair, pt instructed to take Miralax daily for week before the procedure. Also given instructions for two day prep, pt requested pills to use with 2 day prep.  ?No A fib or A flutter ? ?Sutab Coupon to pt in PV today , Code to Pharmacy and  NO PA's for preps discussed with pt In PV today  ?Discussed with pt there will be an out-of-pocket cost for prep and that varies from $0 to 70 +  dollars - pt verbalized understanding  ? ? ?PV completed over the phone. Pt verified name, DOB, address and insurance during PV today.  ?Pt mailed instruction packet with copy of consent form to read and not return, and instructions.  ?Pt encouraged to call with questions or issues.  ?If pt has My chart, procedure instructions sent via My Chart  ?Insurance confirmed with pt at [facility] today   ?

## 2021-05-22 ENCOUNTER — Telehealth: Payer: Self-pay | Admitting: Cardiology

## 2021-05-22 ENCOUNTER — Other Ambulatory Visit: Payer: Self-pay | Admitting: Orthopedic Surgery

## 2021-05-22 ENCOUNTER — Encounter: Payer: Self-pay | Admitting: Family Medicine

## 2021-05-22 NOTE — Telephone Encounter (Signed)
? ?  Pre-operative Risk Assessment  ?  ?Patient Name: Michael Durham  ?DOB: 12-04-1955 ?MRN: [id number]  ? ?  ? ?Request for Surgical Clearance   ? ?Procedure:   exicison retinacular cyst right middle finger, possible A 1 pulley release  ? ?Date of Surgery:  Clearance 06/14/21                              ?   ?Surgeon:  Dr. Leanora Cover  ?Surgeon's Group or Practice Name:  [facility] ?Phone number:  [phone] ?Fax number:  [phone] ?  ?Type of Clearance Requested:   ?- Medical  ?- Pharmacy:  Hold Aspirin how many days prior to surgery.   ?  ?Type of Anesthesia:  Not Indicated ?  ?Additional requests/questions:   can patient take Ibuprofen ? ?Signed, ?Milbert Coulter   ?05/22/2021, 4:47 PM  ? ?

## 2021-05-23 NOTE — Telephone Encounter (Signed)
   Name: Michael Durham  DOB: [birth year]  MRN: [id number]  Primary Cardiologist: Dr. Marlou Porch - seen last in 02/2020  Chart reviewed as part of pre-operative protocol coverage. Because of Tylyn Derwin Akel's past medical history and time since last visit, he will require a follow-up in-office visit in order to better assess preoperative cardiovascular risk. Last OV >1 yr ago. Ibuprofen question can also be reviewed at that visit.  Pre-op covering staff: - Please schedule appointment and call patient to inform them. If patient already had an upcoming appointment within acceptable timeframe, please add "pre-op clearance" to the appointment notes so provider is aware. - Please contact requesting surgeon's office via preferred method (i.e, phone, fax) to inform them of need for appointment prior to surgery.  Does not need to route to MD for ASA. CAC noted but no hx of MI/PCI per notes, can be reviewed in follow-up.   Charlie Pitter, PA-C  05/23/2021, 4:05 PM

## 2021-05-24 NOTE — Telephone Encounter (Signed)
Pt agreeable to plan of care fore in office appt 05/27/21 @ 8 am at [facility].

## 2021-05-26 NOTE — Progress Notes (Addendum)
Office Visit    Patient Name: Michael Durham Date of Encounter: 05/27/2021  Primary Care Provider:  Billie Ruddy, MD Primary Cardiologist:  Candee Furbish, MD Primary Electrophysiologist: None  Chief Complaint    Michael Durham is a 66 y.o. male with PMH of HLD, bipolar disorder, depression, nonobstructive CAD presents today for surgical clearance.  Past Medical History    Past Medical History:  Diagnosis Date   ADHD    Allergy    Anxiety    Bipolar 2 disorder ([place])    Depression    Diverticulosis    ED (erectile dysfunction)    Encephalitis, Age 56    HLD (hyperlipidemia)    Hx of colonic polyp - last colonoscopy with Dr. Alinda Sierras on 12/21/13 08/03/2018   Vitamin D deficiency    Past Surgical History:  Procedure Laterality Date   BRAIN SURGERY     COLONOSCOPY     MOUTH SURGERY      Allergies  Allergies  Allergen Reactions   Wellbutrin [Bupropion] Other (See Comments)    Severe headache.   Tetanus Toxoids     "old horse serum preparation"    History of Present Illness    Michael Durham has a PMH of HLD, bipolar disorder, depression, nonobstructive CAD.  He was last seen by Dr. Marlou Porch on 02/27/2020 for post hospital follow-up of chest pain. CT angio of the chest was ordered in the ED that revealed minor coronary artery calcification. Lexiscan Myoview was ordered on 03/07/2020 that showed no ischemia or infarct and normal study.  He was started on 81 mg aspirin and atorvastatin was increased to 40 mg high intensity. Patient  presents today for surgical clearance regarding hand procedure.  Since last being seen in the office patient reports doing well with no new cardiac complaints at this time.  He presents for preoperative clearance for upcoming hand surgery.  He is currently taking his Crestor 10 mg twice a week due to shoulder pain.  He has not had his lipids rechecked by PCP since last year and plans to have those done in the next few months.  He is  currently not physically active but does do gardening and was encouraged to increase physical activity as tolerated.  He otherwise has high functioning status with no deficits. Patient denies chest pain, palpitations, dyspnea, PND, orthopnea, nausea, vomiting, dizziness, syncope, edema, weight gain, or early satiety.   Home Medications    Current Outpatient Medications  Medication Sig Dispense Refill   amphetamine-dextroamphetamine (ADDERALL) 20 MG tablet Take 1 tablet (20 mg total) by mouth 2 (two) times daily. 60 tablet 0   ARIPiprazole (ABILIFY) 5 MG tablet TAKE 1 TABLET BY MOUTH AT BEDTIME 90 tablet 0   aspirin EC 81 MG tablet Take 1 tablet (81 mg total) by mouth daily. Swallow whole. 90 tablet 3   Cholecalciferol (VITAMIN D) 2000 units CAPS Take 2,000 Units by mouth daily.     escitalopram (LEXAPRO) 20 MG tablet Take 1 tablet by mouth once daily 90 tablet 1   latanoprost (XALATAN) 0.005 % ophthalmic solution INSTILL 1 DROP INTO EACH EYE AT BEDTIME     Omega-3 Fatty Acids (FISH OIL) 1200 MG CAPS Take 1 capsule by mouth daily at 12 noon.     rosuvastatin (CRESTOR) 10 MG tablet Take 1 tablet (10 mg total) by mouth 2 (two) times a week. 30 tablet 3   Sodium Sulfate-Mag Sulfate-KCl (SUTAB) [phone] MG TABS Take 1 kit by mouth  as directed. 24 tablet 0   tadalafil (CIALIS) 5 MG tablet 1-3 tabs po daily as needed. 30 tablet 2   No current facility-administered medications for this visit.     Review of Systems  Please see the history of present illness.    (+) Shoulder pain  All other systems reviewed and are otherwise negative except as noted above.  Physical Exam    Wt Readings from Last 3 Encounters:  05/27/21 187 lb 12.8 oz (85.2 kg)  05/07/21 185 lb (83.9 kg)  04/29/21 180 lb (81.6 kg)   VS: Vitals:   05/27/21 0800  BP: 120/80  Pulse: 69  SpO2: 93%  ,Body mass index is 27.73 kg/m.  Constitutional:      Appearance: Healthy appearance. Not in distress.  Neck:      Vascular: JVD normal.  Pulmonary:     Effort: Pulmonary effort is normal.     Breath sounds: No wheezing. No rales. Diminished in the bases Cardiovascular:     Normal rate. Regular rhythm. Normal S1. Normal S2.      Murmurs: There is no murmur.  Edema:    Peripheral edema absent.  Abdominal:     Palpations: Abdomen is soft non tender. There is no hepatomegaly.  Skin:    General: Skin is warm and dry.  Neurological:     General: No focal deficit present.     Mental Status: Alert and oriented to person, place and time.     Cranial Nerves: Cranial nerves are intact.  EKG/LABS/Other Studies Reviewed    ECG personally reviewed by me today -normal sinus rhythm with incomplete right bundle branch block rate of 69 bpm.  No acute changes  Lab Results  Component Value Date   WBC 8.2 02/17/2020   HGB 15.4 02/17/2020   HCT 46.4 02/17/2020   MCV 92.1 02/17/2020   PLT 286 02/17/2020   Lab Results  Component Value Date   CREATININE 1.03 02/22/2020   BUN 12 02/22/2020   NA 135 02/22/2020   K 4.8 02/22/2020   CL 99 02/22/2020   CO2 31 02/22/2020   Lab Results  Component Value Date   ALT 32 08/03/2018   AST 21 08/03/2018   ALKPHOS 59 08/03/2018   BILITOT 0.6 08/03/2018   Lab Results  Component Value Date   CHOL 136 02/22/2020   HDL 45.90 02/22/2020   LDLCALC 68 02/22/2020   TRIG 112.0 02/22/2020   CHOLHDL 3 02/22/2020    Lab Results  Component Value Date   HGBA1C 5.9 08/03/2018   Assessment & Plan    1. Preoperative surgical clearance: -Patient presents for surgical clearance of upcoming right hand procedure.[id number]} Michael Durham perioperative risk of a major cardiac event is 0.4% according to the Revised Cardiac Risk Index (RCRI).  Therefore, he is at low risk for perioperative complications.  His functional capacity is excellent at 4.31 METs according to the Duke Activity Status Index (DASI).  Recommendations: According to ACC/AHA guidelines, no further cardiovascular  testing needed.  The patient may proceed to surgery at acceptable risk.   -From Cardiovascular standpoint okay to hold 81 mg ASA for 5-7 days prior to procedure and restart when safely possible following procedure.  2. Hyperlipidemia: -Patient complains of myalgias with Crestor and primary care provider decreased Crestor dose to 10 mg twice daily. -His last LDL cholesterol was completed on 02/2020 with LDL of 68 -He was advised that if his cholesterol numbers have increased that we can discuss alternatives to statins  and possible referral to lipid clinic.  3. Nonobstructive CAD: -Stable with no anginal symptoms. No indication for ischemic evaluation.   -Currently taking Crestor 10 mg twice weekly and ASA 81 mg daily.  Disposition: Follow-up with Candee Furbish, MD or APP in 12 months    Medication Adjustments/Labs and Tests Ordered: Current medicines are reviewed at length with the patient today.  Concerns regarding medicines are outlined above.   Signed, Mable Fill, Marissa Nestle, NP 05/27/2021, 8:31 AM [place]

## 2021-05-27 ENCOUNTER — Encounter: Payer: Self-pay | Admitting: Nurse Practitioner

## 2021-05-27 ENCOUNTER — Ambulatory Visit (INDEPENDENT_AMBULATORY_CARE_PROVIDER_SITE_OTHER): Payer: Medicare Other | Admitting: Nurse Practitioner

## 2021-05-27 VITALS — BP 120/80 | HR 69 | Ht 69.0 in | Wt 187.8 lb

## 2021-05-27 DIAGNOSIS — Z01810 Encounter for preprocedural cardiovascular examination: Secondary | ICD-10-CM

## 2021-05-27 DIAGNOSIS — I2510 Atherosclerotic heart disease of native coronary artery without angina pectoris: Secondary | ICD-10-CM

## 2021-05-27 DIAGNOSIS — E7800 Pure hypercholesterolemia, unspecified: Secondary | ICD-10-CM | POA: Diagnosis not present

## 2021-05-27 NOTE — Patient Instructions (Signed)
Medication Instructions:  Your physician recommends that you continue on your current medications as directed. Please refer to the Current Medication list given to you today.  *If you need a refill on your cardiac medications before your next appointment, please call your pharmacy*   Lab Work: None ordered  If you have labs (blood work) drawn today and your tests are completely normal, you will receive your results only by: [place] (if you have MyChart) OR A paper copy in the mail If you have any lab test that is abnormal or we need to change your treatment, we will call you to review the results.   Testing/Procedures: None ordered   Follow-Up: At [facility], you and your health needs are our priority.  As part of our continuing mission to provide you with exceptional heart care, we have created designated Provider Care Teams.  These Care Teams include your primary Cardiologist (physician) and Advanced Practice Providers (APPs -  Physician Assistants and Nurse Practitioners) who all work together to provide you with the care you need, when you need it.  We recommend signing up for the patient portal called "MyChart".  Sign up information is provided on this After Visit Summary.  MyChart is used to connect with patients for Virtual Visits (Telemedicine).  Patients are able to view lab/test results, encounter notes, upcoming appointments, etc.  Non-urgent messages can be sent to your provider as well.   To learn more about what you can do with MyChart, go to [email or web address].    Your next appointment:   1 year(s)  The format for your next appointment:   In Person  Provider:   Candee Furbish, MD     Other Instructions   Important Information About Sugar

## 2021-06-04 ENCOUNTER — Encounter: Payer: Self-pay | Admitting: Gastroenterology

## 2021-06-04 ENCOUNTER — Ambulatory Visit (AMBULATORY_SURGERY_CENTER): Payer: Medicare Other | Admitting: Gastroenterology

## 2021-06-04 VITALS — BP 151/84 | HR 58 | Temp 98.0°F | Resp 10 | Ht 69.0 in | Wt 185.0 lb

## 2021-06-04 DIAGNOSIS — D125 Benign neoplasm of sigmoid colon: Secondary | ICD-10-CM | POA: Diagnosis not present

## 2021-06-04 DIAGNOSIS — D122 Benign neoplasm of ascending colon: Secondary | ICD-10-CM | POA: Diagnosis not present

## 2021-06-04 DIAGNOSIS — D128 Benign neoplasm of rectum: Secondary | ICD-10-CM

## 2021-06-04 DIAGNOSIS — Z860100 Personal history of colon polyps, unspecified: Secondary | ICD-10-CM

## 2021-06-04 DIAGNOSIS — Z09 Encounter for follow-up examination after completed treatment for conditions other than malignant neoplasm: Secondary | ICD-10-CM

## 2021-06-04 DIAGNOSIS — K5140 Inflammatory polyps of colon without complications: Secondary | ICD-10-CM

## 2021-06-04 MED ORDER — SODIUM CHLORIDE 0.9 % IV SOLN: 500.0000 mL | Freq: Once | INTRAVENOUS | Status: DC

## 2021-06-04 NOTE — Progress Notes (Signed)
To pacu, VSS. Report to [email or web address] 

## 2021-06-04 NOTE — Progress Notes (Signed)
Called to room to assist during endoscopic procedure.  Patient ID and intended procedure confirmed with present staff. Received instructions for my participation in the procedure from the performing physician.  

## 2021-06-04 NOTE — Progress Notes (Signed)
Pt's states no medical or surgical changes since previsit or office visit. 

## 2021-06-04 NOTE — Patient Instructions (Signed)
Handouts provided on polyps, diverticulosis, hemorrhoids and high-fiber diet.   Recommend a high-fiber diet (see handout). Use FiberCon 1-2 tablets by mouth daily.    YOU HAD AN ENDOSCOPIC PROCEDURE TODAY AT [facility]:   Refer to the procedure report that was given to you for any specific questions about what was found during the examination.  If the procedure report does not answer your questions, please call your gastroenterologist to clarify.  If you requested that your care partner not be given the details of your procedure findings, then the procedure report has been included in a sealed envelope for you to review at your convenience later.  YOU SHOULD EXPECT: Some feelings of bloating in the abdomen. Passage of more gas than usual.  Walking can help get rid of the air that was put into your GI tract during the procedure and reduce the bloating. If you had a lower endoscopy (such as a colonoscopy or flexible sigmoidoscopy) you may notice spotting of blood in your stool or on the toilet paper. If you underwent a bowel prep for your procedure, you may not have a normal bowel movement for a few days.  Please Note:  You might notice some irritation and congestion in your nose or some drainage.  This is from the oxygen used during your procedure.  There is no need for concern and it should clear up in a day or so.  SYMPTOMS TO REPORT IMMEDIATELY:  Following lower endoscopy (colonoscopy or flexible sigmoidoscopy):  Excessive amounts of blood in the stool  Significant tenderness or worsening of abdominal pains  Swelling of the abdomen that is new, acute  Fever of 100F or higher  For urgent or emergent issues, a gastroenterologist can be reached at any hour by calling [phone]. Do not use MyChart messaging for urgent concerns.    DIET:  We do recommend a small meal at first, but then you may proceed to your regular diet.  Drink plenty of fluids but you should avoid  alcoholic beverages for 24 hours.  ACTIVITY:  You should plan to take it easy for the rest of today and you should NOT DRIVE or use heavy machinery until tomorrow (because of the sedation medicines used during the test).    FOLLOW UP: Our staff will call the number listed on your records 48-72 hours following your procedure to check on you and address any questions or concerns that you may have regarding the information given to you following your procedure. If we do not reach you, we will leave a message.  We will attempt to reach you two times.  During this call, we will ask if you have developed any symptoms of COVID 19. If you develop any symptoms (ie: fever, flu-like symptoms, shortness of breath, cough etc.) before then, please call [phone].  If you test positive for Covid 19 in the 2 weeks post procedure, please call and report this information to [place].    If any biopsies were taken you will be contacted by phone or by letter within the next 1-3 weeks.  Please call us at [phone] if you have not heard about the biopsies in 3 weeks.    SIGNATURES/CONFIDENTIALITY: You and/or your care partner have signed paperwork which will be entered into your electronic medical record.  These signatures attest to the fact that that the information above on your After Visit Summary has been reviewed and is understood.  Full responsibility of the confidentiality of this discharge  information lies with you and/or your care-partner.

## 2021-06-04 NOTE — Progress Notes (Signed)
GASTROENTEROLOGY PROCEDURE H&P NOTE   Primary Care Physician: Billie Ruddy, MD  HPI: Michael Durham is a 66 y.o. male who presents for Colonoscopy for screening.  Past Medical History:  Diagnosis Date   ADHD    Allergy    Anxiety    Bipolar 2 disorder ([place])    Depression    Diverticulosis    ED (erectile dysfunction)    Encephalitis, Age 59    HLD (hyperlipidemia)    Hx of colonic polyp - last colonoscopy with Dr. Alinda Sierras on 12/21/13 08/03/2018   Vitamin D deficiency    Past Surgical History:  Procedure Laterality Date   BRAIN SURGERY     COLONOSCOPY     MOUTH SURGERY     Current Outpatient Medications  Medication Sig Dispense Refill   amphetamine-dextroamphetamine (ADDERALL) 20 MG tablet Take 1 tablet (20 mg total) by mouth 2 (two) times daily. 60 tablet 0   ARIPiprazole (ABILIFY) 5 MG tablet TAKE 1 TABLET BY MOUTH AT BEDTIME 90 tablet 0   aspirin EC 81 MG tablet Take 1 tablet (81 mg total) by mouth daily. Swallow whole. 90 tablet 3   Cholecalciferol (VITAMIN D) 2000 units CAPS Take 2,000 Units by mouth daily.     escitalopram (LEXAPRO) 20 MG tablet Take 1 tablet by mouth once daily 90 tablet 1   latanoprost (XALATAN) 0.005 % ophthalmic solution INSTILL 1 DROP INTO EACH EYE AT BEDTIME     Omega-3 Fatty Acids (FISH OIL) 1200 MG CAPS Take 1 capsule by mouth daily at 12 noon.     rosuvastatin (CRESTOR) 10 MG tablet Take 1 tablet (10 mg total) by mouth 2 (two) times a week. 30 tablet 3   tadalafil (CIALIS) 5 MG tablet 1-3 tabs po daily as needed. 30 tablet 2   Current Facility-Administered Medications  Medication Dose Route Frequency Provider Last Rate Last Admin   0.9 %  sodium chloride infusion  500 mL Intravenous Once Mansouraty, Telford Nab., MD        Current Outpatient Medications:    amphetamine-dextroamphetamine (ADDERALL) 20 MG tablet, Take 1 tablet (20 mg total) by mouth 2 (two) times daily., Disp: 60 tablet, Rfl: 0   ARIPiprazole (ABILIFY) 5 MG tablet,  TAKE 1 TABLET BY MOUTH AT BEDTIME, Disp: 90 tablet, Rfl: 0   aspirin EC 81 MG tablet, Take 1 tablet (81 mg total) by mouth daily. Swallow whole., Disp: 90 tablet, Rfl: 3   Cholecalciferol (VITAMIN D) 2000 units CAPS, Take 2,000 Units by mouth daily., Disp: , Rfl:    escitalopram (LEXAPRO) 20 MG tablet, Take 1 tablet by mouth once daily, Disp: 90 tablet, Rfl: 1   latanoprost (XALATAN) 0.005 % ophthalmic solution, INSTILL 1 DROP INTO EACH EYE AT BEDTIME, Disp: , Rfl:    Omega-3 Fatty Acids (FISH OIL) 1200 MG CAPS, Take 1 capsule by mouth daily at 12 noon., Disp: , Rfl:    rosuvastatin (CRESTOR) 10 MG tablet, Take 1 tablet (10 mg total) by mouth 2 (two) times a week., Disp: 30 tablet, Rfl: 3   tadalafil (CIALIS) 5 MG tablet, 1-3 tabs po daily as needed., Disp: 30 tablet, Rfl: 2  Current Facility-Administered Medications:    0.9 %  sodium chloride infusion, 500 mL, Intravenous, Once, Mansouraty, Telford Nab., MD Allergies  Allergen Reactions   Wellbutrin [Bupropion] Other (See Comments)    Severe headache.   Tetanus Toxoids     "old horse serum preparation"   Family History  Problem Relation Age of  Onset   Dementia Mother    Prostate cancer Father    Bladder Cancer Father    Diabetes Brother    Breast cancer Maternal Aunt    Esophageal cancer Neg Hx    Stomach cancer Neg Hx    Pancreatic cancer Neg Hx    Inflammatory bowel disease Neg Hx    Liver disease Neg Hx    Rectal cancer Neg Hx    Colon cancer Neg Hx    Social History   Socioeconomic History   Marital status: Married    Spouse name: Not on file   Number of children: Not on file   Years of education: Not on file   Highest education level: Not on file  Occupational History   Not on file  Tobacco Use   Smoking status: Former   Smokeless tobacco: Never   Tobacco comments:    Quit > 15 years ago, smoked 1-2 ppd for 30 years  Vaping Use   Vaping Use: Never used  Substance and Sexual Activity   Alcohol use: Yes     Comment: occasionally   Drug use: No   Sexual activity: Yes    Partners: Female    Birth control/protection: None  Other Topics Concern   Not on file  Social History Narrative   Not on file   Social Determinants of Health   Financial Resource Strain: Low Risk    Difficulty of Paying Living Expenses: Not hard at all  Food Insecurity: No Food Insecurity   Worried About Charity fundraiser in the Last Year: Never true   Ran Out of Food in the Last Year: Never true  Transportation Needs: No Transportation Needs   Lack of Transportation (Medical): No   Lack of Transportation (Non-Medical): No  Physical Activity: Inactive   Days of Exercise per Week: 0 days   Minutes of Exercise per Session: 0 min  Stress: No Stress Concern Present   Feeling of Stress : Not at all  Social Connections: Not on file  Intimate Partner Violence: Not on file    Physical Exam: Today's Vitals   06/04/21 0747 06/04/21 0748  BP: (!) 149/88   Pulse: 70   Temp: 98 F (36.7 C) 98 F (36.7 C)  TempSrc: Skin   SpO2: 97%   Weight: 185 lb (83.9 kg)   Height: '5\' 9"'$  (1.753 m)    Body mass index is 27.32 kg/m. GEN: NAD EYE: Sclerae anicteric ENT: MMM CV: Non-tachycardic GI: Soft, NT/ND NEURO:  Alert & Oriented x 3  Lab Results: No results for input(s): WBC, HGB, HCT, PLT in the last 72 hours. BMET No results for input(s): NA, K, CL, CO2, GLUCOSE, BUN, CREATININE, CALCIUM in the last 72 hours. LFT No results for input(s): PROT, ALBUMIN, AST, ALT, ALKPHOS, BILITOT, BILIDIR, IBILI in the last 72 hours. PT/INR No results for input(s): LABPROT, INR in the last 72 hours.   Impression / Plan: This is a 66 y.o.male who presents for Colonoscopy for screening.  The risks and benefits of endoscopic evaluation/treatment were discussed with the patient and/or family; these include but are not limited to the risk of perforation, infection, bleeding, missed lesions, lack of diagnosis, severe illness requiring  hospitalization, as well as anesthesia and sedation related illnesses.  The patient's history has been reviewed, patient examined, no change in status, and deemed stable for procedure.  The patient and/or family is agreeable to proceed.    Justice Britain, MD [facility] Advanced Endoscopy Office #  [phone]  

## 2021-06-04 NOTE — Op Note (Signed)
[facility] Patient Name: Michael Durham Procedure Date: 06/04/2021 8:48 AM MRN: [id number] Endoscopist: Justice Britain , MD Age: 66 Referring MD:  Date of Birth: May 30, 1955 Gender: Male Account #: [id number] Procedure:                Colonoscopy Indications:              Surveillance: History of numerous (> 10) adenomas                            on last colonoscopy (< 3 yrs) and only fair                            preparation previously Medicines:                Monitored Anesthesia Care Procedure:                Pre-Anesthesia Assessment:                           - Prior to the procedure, a History and Physical                            was performed, and patient medications and                            allergies were reviewed. The patient's tolerance of                            previous anesthesia was also reviewed. The risks                            and benefits of the procedure and the sedation                            options and risks were discussed with the patient.                            All questions were answered, and informed consent                            was obtained. Prior Anticoagulants: The patient has                            taken no previous anticoagulant or antiplatelet                            agents except for aspirin. ASA Grade Assessment: II                            - A patient with mild systemic disease. After                            reviewing the risks and benefits, the patient was  deemed in satisfactory condition to undergo the                            procedure.                           After obtaining informed consent, the colonoscope                            was passed under direct vision. Throughout the                            procedure, the patient's blood pressure, pulse, and                            oxygen saturations were monitored continuously. The                             Olympus CF-HQ190L (Serial# [id number]) Colonoscope was                            introduced through the anus and advanced to the 5                            cm into the ileum. The colonoscopy was performed                            without difficulty. The patient tolerated the                            procedure. The quality of the bowel preparation was                            good. The terminal ileum, ileocecal valve,                            appendiceal orifice, and rectum were photographed. Scope In: 8:57:18 AM Scope Out: 9:15:56 AM Scope Withdrawal Time: 0 hours 15 minutes 57 seconds  Total Procedure Duration: 0 hours 18 minutes 38 seconds  Findings:                 The digital rectal exam findings include                            hemorrhoids. Pertinent negatives include no                            palpable rectal lesions.                           The terminal ileum and ileocecal valve appeared                            normal.  Six sessile polyps were found in the rectum (1),                            sigmoid colon (3) and ascending colon (2). The                            polyps were 2 to 5 mm in size. These polyps were                            removed with a cold snare. Resection and retrieval                            were complete.                           Scattered small-mouthed diverticula were found in                            the entire colon.                           Normal mucosa was found in the entire colon                            otherwise.                           Non-bleeding non-thrombosed external and internal                            hemorrhoids were found during retroflexion, during                            perianal exam and during digital exam. The                            hemorrhoids were Grade II (internal hemorrhoids                            that prolapse but reduce spontaneously). Complications:             No immediate complications. Estimated Blood Loss:     Estimated blood loss was minimal. Impression:               - Hemorrhoids found on digital rectal exam.                           - The examined portion of the ileum was normal.                           - Six 2 to 5 mm polyps in the rectum, in the                            sigmoid colon and in the ascending colon, removed  with a cold snare. Resected and retrieved.                           - Diverticulosis in the entire examined colon.                           - Normal mucosa in the entire examined colon                            otherwise.                           - Non-bleeding non-thrombosed external and internal                            hemorrhoids. Recommendation:           - The patient will be observed post-procedure,                            until all discharge criteria are met.                           - Discharge patient to home.                           - Patient has a contact number available for                            emergencies. The signs and symptoms of potential                            delayed complications were discussed with the                            patient. Return to normal activities tomorrow.                            Written discharge instructions were provided to the                            patient.                           - High fiber diet.                           - Use FiberCon 1-2 tablets PO daily.                           - Await pathology results.                           - Repeat colonoscopy in 3/5/7 years for                            surveillance based on pathology results and prior  history of adenomatous colon polyps. Use same                            preparation as we did this time because it was a                            much improved preparation for today's evaluation.                           - The  findings and recommendations were discussed                            with the patient.                           - The findings and recommendations were discussed                            with the patient's family. Justice Britain, MD 06/04/2021 9:21:21 AM

## 2021-06-05 ENCOUNTER — Encounter (HOSPITAL_BASED_OUTPATIENT_CLINIC_OR_DEPARTMENT_OTHER): Payer: Self-pay | Admitting: Orthopedic Surgery

## 2021-06-05 ENCOUNTER — Other Ambulatory Visit: Payer: Self-pay

## 2021-06-05 ENCOUNTER — Telehealth: Payer: Self-pay

## 2021-06-05 NOTE — Telephone Encounter (Signed)
  Follow up Call-     06/04/2021    7:48 AM 06/14/2019    2:07 PM  Call back number  Post procedure Call Back phone  # [phone]  Permission to leave phone message Yes Yes     Patient questions:  Do you have a fever, pain , or abdominal swelling? No. Pain Score  0 *  Have you tolerated food without any problems? Yes.    Have you been able to return to your normal activities? Yes.    Do you have any questions about your discharge instructions: Diet   No. Medications  No. Follow up visit  No.  Do you have questions or concerns about your Care? No.  Actions: * If pain score is 4 or above: No action needed, pain <4.

## 2021-06-05 NOTE — Telephone Encounter (Signed)
Hassan Rowan from the [facility] states they received a clearance stating it was okay to hold aspirin 2 days prior to procedure. She states they also have an office note stating it is okay to hold 5-7 days, but did not list a medication. She states they need to clarify what medication can be held and for how many days. It can be faxed to: [phone]

## 2021-06-13 ENCOUNTER — Encounter: Payer: Self-pay | Admitting: Gastroenterology

## 2021-06-13 NOTE — Progress Notes (Addendum)
Sent text reminding pt to come pick up pre surgery drink.   Enhanced Recovery after Surgery  Enhanced Recovery after Surgery is a protocol used to improve the stress on your body and your recovery after surgery.  Patient Instructions  The night before surgery:  No food after midnight. ONLY clear liquids after midnight  The day of surgery (if you do NOT have diabetes):  Drink ONE (1) Pre-Surgery Clear Ensure as directed.   This drink was given to you during your hospital  pre-op appointment visit. The pre-op nurse will instruct you on the time to drink the  Pre-Surgery Ensure depending on your surgery time. Finish the drink at the designated time by the pre-op nurse.  Nothing else to drink after completing the  Pre-Surgery Clear Ensure.  The day of surgery (if you have diabetes): Drink ONE (1) Gatorade 2 (G2) as directed. This drink was given to you during your hospital  pre-op appointment visit.  The pre-op nurse will instruct you on the time to drink the   Gatorade 2 (G2) depending on your surgery time. Color of the Gatorade may vary. Red is not allowed. Nothing else to drink after completing the  Gatorade 2 (G2).         If office.you have questions, please contact your surgeon's office

## 2021-06-14 ENCOUNTER — Encounter (HOSPITAL_BASED_OUTPATIENT_CLINIC_OR_DEPARTMENT_OTHER): Payer: Self-pay | Admitting: Orthopedic Surgery

## 2021-06-14 ENCOUNTER — Ambulatory Visit (HOSPITAL_BASED_OUTPATIENT_CLINIC_OR_DEPARTMENT_OTHER)
Admission: RE | Admit: 2021-06-14 | Discharge: 2021-06-14 | Disposition: A | Discharge status: Home / Self Care | Payer: Medicare Other | Attending: Orthopedic Surgery | Admitting: Orthopedic Surgery

## 2021-06-14 ENCOUNTER — Encounter (HOSPITAL_BASED_OUTPATIENT_CLINIC_OR_DEPARTMENT_OTHER)
Admission: RE | Disposition: A | Discharge status: Home / Self Care | Payer: Self-pay | Source: Home / Self Care | Attending: Orthopedic Surgery

## 2021-06-14 ENCOUNTER — Ambulatory Visit (HOSPITAL_BASED_OUTPATIENT_CLINIC_OR_DEPARTMENT_OTHER): Payer: Medicare Other | Admitting: Certified Registered"

## 2021-06-14 DIAGNOSIS — M7138 Other bursal cyst, other site: Secondary | ICD-10-CM | POA: Diagnosis not present

## 2021-06-14 DIAGNOSIS — M728 Other fibroblastic disorders: Secondary | ICD-10-CM | POA: Diagnosis present

## 2021-06-14 DIAGNOSIS — Z87891 Personal history of nicotine dependence: Secondary | ICD-10-CM | POA: Insufficient documentation

## 2021-06-14 DIAGNOSIS — M67843 Other specified disorders of tendon, right hand: Secondary | ICD-10-CM | POA: Diagnosis not present

## 2021-06-14 DIAGNOSIS — Z01818 Encounter for other preprocedural examination: Secondary | ICD-10-CM

## 2021-06-14 HISTORY — PX: CYST EXCISION: SHX5701

## 2021-06-14 HISTORY — PX: TRIGGER FINGER RELEASE: SHX641

## 2021-06-14 SURGERY — CYST REMOVAL
Anesthesia: Monitor Anesthesia Care | Site: Hand | Laterality: Right

## 2021-06-14 MED ORDER — ONDANSETRON HCL 4 MG/2ML IJ SOLN
INTRAMUSCULAR | Status: AC
  Filled 2021-06-14: qty 2

## 2021-06-14 MED ORDER — LACTATED RINGERS IV SOLN: INTRAVENOUS | Status: DC

## 2021-06-14 MED ORDER — CEFAZOLIN SODIUM-DEXTROSE 2-4 GM/100ML-% IV SOLN
2.0000 g | INTRAVENOUS | Status: AC
  Administered 2021-06-14: 2 g via INTRAVENOUS

## 2021-06-14 MED ORDER — CEFAZOLIN SODIUM-DEXTROSE 2-4 GM/100ML-% IV SOLN
INTRAVENOUS | Status: AC
  Filled 2021-06-14: qty 100

## 2021-06-14 MED ORDER — PROPOFOL 500 MG/50ML IV EMUL
INTRAVENOUS | Status: DC | PRN
  Administered 2021-06-14: 50 ug/kg/min via INTRAVENOUS

## 2021-06-14 MED ORDER — LIDOCAINE HCL (PF) 0.5 % IJ SOLN
INTRAMUSCULAR | Status: DC | PRN
  Administered 2021-06-14: 35 mL via INTRAVENOUS

## 2021-06-14 MED ORDER — FENTANYL CITRATE (PF) 100 MCG/2ML IJ SOLN
INTRAMUSCULAR | Status: AC
  Filled 2021-06-14: qty 2

## 2021-06-14 MED ORDER — MIDAZOLAM HCL 5 MG/5ML IJ SOLN
INTRAMUSCULAR | Status: DC | PRN
  Administered 2021-06-14: 2 mg via INTRAVENOUS

## 2021-06-14 MED ORDER — MIDAZOLAM HCL 2 MG/2ML IJ SOLN
INTRAMUSCULAR | Status: AC
  Filled 2021-06-14: qty 2

## 2021-06-14 MED ORDER — BUPIVACAINE HCL (PF) 0.25 % IJ SOLN
INTRAMUSCULAR | Status: DC | PRN
  Administered 2021-06-14: 9 mL

## 2021-06-14 MED ORDER — BUPIVACAINE HCL (PF) 0.25 % IJ SOLN
INTRAMUSCULAR | Status: AC
  Filled 2021-06-14: qty 30

## 2021-06-14 MED ORDER — ONDANSETRON HCL 4 MG/2ML IJ SOLN
INTRAMUSCULAR | Status: DC | PRN
  Administered 2021-06-14: 4 mg via INTRAVENOUS

## 2021-06-14 MED ORDER — FENTANYL CITRATE (PF) 100 MCG/2ML IJ SOLN
INTRAMUSCULAR | Status: DC | PRN
  Administered 2021-06-14: 50 ug via INTRAVENOUS

## 2021-06-14 MED ORDER — 0.9 % SODIUM CHLORIDE (POUR BTL) OPTIME
TOPICAL | Status: DC | PRN
  Administered 2021-06-14: 60 mL

## 2021-06-14 MED ORDER — FENTANYL CITRATE (PF) 100 MCG/2ML IJ SOLN: 25.0000 ug | INTRAMUSCULAR | Status: DC | PRN

## 2021-06-14 MED ORDER — PROPOFOL 10 MG/ML IV BOLUS
INTRAVENOUS | Status: AC
  Filled 2021-06-14: qty 20

## 2021-06-14 MED ORDER — HYDROCODONE-ACETAMINOPHEN 5-325 MG PO TABS: ORAL_TABLET | ORAL | 0 refills | Status: DC

## 2021-06-14 SURGICAL SUPPLY — 54 items
APL PRP STRL LF DISP 70% ISPRP (MISCELLANEOUS) ×2
APL SKNCLS STERI-STRIP NONHPOA (GAUZE/BANDAGES/DRESSINGS)
BENZOIN TINCTURE PRP APPL 2/3 (GAUZE/BANDAGES/DRESSINGS) IMPLANT
BLADE MINI RND TIP GREEN BEAV (BLADE) IMPLANT
BLADE SURG 15 STRL LF DISP TIS (BLADE) ×4 IMPLANT
BLADE SURG 15 STRL SS (BLADE) ×6
BNDG CMPR 5X2 CHSV 1 LYR STRL (GAUZE/BANDAGES/DRESSINGS) ×2
BNDG CMPR 75X21 PLY HI ABS (MISCELLANEOUS)
BNDG CMPR 9X4 STRL LF SNTH (GAUZE/BANDAGES/DRESSINGS) ×2
BNDG COHESIVE 1X5 TAN STRL LF (GAUZE/BANDAGES/DRESSINGS) IMPLANT
BNDG COHESIVE 2X5 TAN ST LF (GAUZE/BANDAGES/DRESSINGS) ×3 IMPLANT
BNDG ELASTIC 2X5.8 VLCR STR LF (GAUZE/BANDAGES/DRESSINGS) IMPLANT
BNDG ELASTIC 3X5.8 VLCR STR LF (GAUZE/BANDAGES/DRESSINGS) IMPLANT
BNDG ESMARK 4X9 LF (GAUZE/BANDAGES/DRESSINGS) ×1 IMPLANT
BNDG GAUZE 1X2.1 STRL (MISCELLANEOUS) IMPLANT
BNDG GAUZE DERMACEA FLUFF (GAUZE/BANDAGES/DRESSINGS)
BNDG GAUZE DERMACEA FLUFF 4 (GAUZE/BANDAGES/DRESSINGS) IMPLANT
BNDG GZE DERMACEA 4 6PLY (GAUZE/BANDAGES/DRESSINGS)
BNDG PLASTER X FAST 3X3 WHT LF (CAST SUPPLIES) IMPLANT
BNDG PLSTR 9X3 FST ST WHT (CAST SUPPLIES)
CHLORAPREP W/TINT 26 (MISCELLANEOUS) ×3 IMPLANT
CORD BIPOLAR FORCEPS 12FT (ELECTRODE) ×3 IMPLANT
COVER BACK TABLE 60X90IN (DRAPES) ×3 IMPLANT
COVER MAYO STAND STRL (DRAPES) ×3 IMPLANT
CUFF TOURN SGL QUICK 18X4 (TOURNIQUET CUFF) ×3 IMPLANT
DRAPE EXTREMITY T 121X128X90 (DISPOSABLE) ×3 IMPLANT
DRAPE SURG 17X23 STRL (DRAPES) ×3 IMPLANT
GAUZE SPONGE 4X4 12PLY STRL (GAUZE/BANDAGES/DRESSINGS) ×3 IMPLANT
GAUZE STRETCH 2X75IN STRL (MISCELLANEOUS) IMPLANT
GAUZE XEROFORM 1X8 LF (GAUZE/BANDAGES/DRESSINGS) ×3 IMPLANT
GLOVE BIO SURGEON STRL SZ7.5 (GLOVE) ×3 IMPLANT
GLOVE BIOGEL PI IND STRL 8 (GLOVE) ×2 IMPLANT
GLOVE BIOGEL PI INDICATOR 8 (GLOVE) ×1
GOWN STRL REUS W/ TWL LRG LVL3 (GOWN DISPOSABLE) ×2 IMPLANT
GOWN STRL REUS W/TWL LRG LVL3 (GOWN DISPOSABLE) ×3
GOWN STRL REUS W/TWL XL LVL3 (GOWN DISPOSABLE) ×3 IMPLANT
NDL HYPO 25X1 1.5 SAFETY (NEEDLE) ×2 IMPLANT
NEEDLE HYPO 25X1 1.5 SAFETY (NEEDLE) ×3 IMPLANT
NS IRRIG 1000ML POUR BTL (IV SOLUTION) ×3 IMPLANT
PACK BASIN DAY SURGERY FS (CUSTOM PROCEDURE TRAY) ×3 IMPLANT
PAD CAST 3X4 CTTN HI CHSV (CAST SUPPLIES) IMPLANT
PAD CAST 4YDX4 CTTN HI CHSV (CAST SUPPLIES) IMPLANT
PADDING CAST ABS 4INX4YD NS (CAST SUPPLIES)
PADDING CAST ABS COTTON 4X4 ST (CAST SUPPLIES) ×2 IMPLANT
PADDING CAST COTTON 3X4 STRL (CAST SUPPLIES)
PADDING CAST COTTON 4X4 STRL (CAST SUPPLIES)
STOCKINETTE 4X48 STRL (DRAPES) ×3 IMPLANT
STRIP CLOSURE SKIN 1/2X4 (GAUZE/BANDAGES/DRESSINGS) IMPLANT
SUT ETHILON 3 0 PS 1 (SUTURE) IMPLANT
SUT ETHILON 4 0 PS 2 18 (SUTURE) ×3 IMPLANT
SYR BULB EAR ULCER 3OZ GRN STR (SYRINGE) ×3 IMPLANT
SYR CONTROL 10ML LL (SYRINGE) ×3 IMPLANT
TOWEL GREEN STERILE FF (TOWEL DISPOSABLE) ×6 IMPLANT
UNDERPAD 30X36 HEAVY ABSORB (UNDERPADS AND DIAPERS) ×3 IMPLANT

## 2021-06-14 NOTE — Anesthesia Procedure Notes (Signed)
Anesthesia Regional Block: Bier block (IV Regional)   Pre-Anesthetic Checklist: , timeout performed,  Correct Patient, Correct Site, Correct Laterality,  Correct Procedure, Correct Position, site marked,  Risks and benefits discussed,  Surgical consent,  Pre-op evaluation,  At surgeon's request and post-op pain management  Laterality: Right  Prep: alcohol swabs        Procedures:,,,,, intact distal pulses, Esmarch exsanguination,  Single tourniquet utilized,  #20gu IV placed    Narrative:   Performed by: Personally  CRNA: Verita Lamb, CRNA

## 2021-06-14 NOTE — Anesthesia Postprocedure Evaluation (Signed)
Anesthesia Post Note  Patient: Michael Durham  Procedure(s) Performed: EXCISION RETINACULAR CYST RIGHT MIDDLE FINGER (Right) A1 PULLEY RELEASE RIGHT MIDDLE FINGER (Right: Hand)     Patient location during evaluation: PACU Anesthesia Type: MAC Level of consciousness: awake Pain management: pain level controlled Vital Signs Assessment: post-procedure vital signs reviewed and stable Respiratory status: spontaneous breathing Cardiovascular status: stable Postop Assessment: no apparent nausea or vomiting Anesthetic complications: no   No notable events documented.  Last Vitals:  Vitals:   06/14/21 1400 06/14/21 1430  BP: (!) 151/86 (!) 153/88  Pulse: 64 66  Resp: 15 16  Temp:  36.4 C  SpO2: 97% 99%    Last Pain:  Vitals:   06/14/21 1430  TempSrc:   PainSc: 0-No pain                 Vesna Kable

## 2021-06-14 NOTE — H&P (Signed)
Michael Durham is an 66 y.o. male.   Chief Complaint: finger cyst HPI: 66 yo male with right long finger annular ligament cyst.  This is bothersome to him and he wishes to have this removed.    Allergies:  Allergies  Allergen Reactions   Wellbutrin [Bupropion] Other (See Comments)    Severe headache.   Tetanus Toxoids     "old horse serum preparation"    Past Medical History:  Diagnosis Date   ADHD    Allergy    Anxiety    Bipolar 2 disorder ([place])    Depression    Diverticulosis    ED (erectile dysfunction)    Encephalitis, Age 4    HLD (hyperlipidemia)    Hx of colonic polyp - last colonoscopy with Dr. Alinda Sierras on 12/21/13 08/03/2018   Vitamin D deficiency     Past Surgical History:  Procedure Laterality Date   BRAIN SURGERY     COLONOSCOPY     MOUTH SURGERY     TOE SURGERY Left    L big toe    Family History: Family History  Problem Relation Age of Onset   Dementia Mother    Prostate cancer Father    Bladder Cancer Father    Diabetes Brother    Breast cancer Maternal Aunt    Esophageal cancer Neg Hx    Stomach cancer Neg Hx    Pancreatic cancer Neg Hx    Inflammatory bowel disease Neg Hx    Liver disease Neg Hx    Rectal cancer Neg Hx    Colon cancer Neg Hx     Social History:   reports that he has quit smoking. He has never used smokeless tobacco. He reports current alcohol use. He reports that he does not use drugs.  Medications: No medications prior to admission.    No results found for this or any previous visit (from the past 48 hour(s)).  No results found.    Height '5\' 9"'$  (1.753 m), weight 83.9 kg.  General appearance: alert, cooperative, and appears stated age Head: Normocephalic, without obvious abnormality, atraumatic Neck: supple, symmetrical, trachea midline Extremities: Intact sensation and capillary refill all digits.  +epl/fpl/io.  No wounds.  Pulses: 2+ and symmetric Skin: Skin color, texture, turgor normal. No rashes or  lesions Neurologic: Grossly normal Incision/Wound: none  Assessment/Plan Right long finger annular ligament cyst.  Plan excision cyst and possible A1 pulley release to try to prevent recurrence.  Non operative and operative treatment options have been discussed with the patient and patient wishes to proceed with operative treatment. Risks, benefits, and alternatives of surgery have been discussed and the patient agrees with the plan of care.   Leanora Cover 06/14/2021, 11:14 AM

## 2021-06-14 NOTE — Op Note (Signed)
06/14/2021 [facility]  Operative Note  PREOPERATIVE DIAGNOSIS: RETINACULAR CYST RIGHT MIDDLE FINGER  POSTOPERATIVE DIAGNOSIS:  RETINACULAR CYST RIGHT MIDDLE FINGER  PROCEDURE: Procedure(s): EXCISION RETINACULAR CYST RIGHT MIDDLE FINGER A1 PULLEY RELEASE RIGHT MIDDLE FINGER long finger  SURGEON:  Leanora Cover, MD  ASSISTANT:  none.  ANESTHESIA:  Bier block with sedation.  IV FLUIDS:  Per anesthesia flow sheet.  ESTIMATED BLOOD LOSS:  Minimal.  COMPLICATIONS:  None.  SPECIMENS:  None.  TOURNIQUET TIME:  Total Tourniquet Time Documented: Forearm (Right) - 21 minutes Total: Forearm (Right) - 21 minutes   DISPOSITION:  Stable to PACU.  LOCATION: [facility]  INDICATIONS: Michael Durham is a 66 y.o. male with mass volar aspect long finger at mp joint level.  It is bothersome to him and he wishes to have it removed with possible A1 pulley release to prevent recurrence.  Risks, benefits and alternatives of surgery were discussed including the risk of blood loss, infection, damage to nerves, vessels, tendons, ligaments, bone, failure of surgery, need for additional surgery, complications with wound healing, continued pain, continued triggering and need for repeat surgery.  He voiced understanding of these risks and elected to proceed.  OPERATIVE COURSE:  After being identified preoperatively by myself, the patient and I agreed upon the procedure and site of procedure.  The surgical site was marked. Surgical consent had been signed. He was given IV Ancef as preoperative antibiotic prophylaxis. He was transported to the operating room and placed on the operating room table in supine position with the right upper extremity on an arm board. Bier block anesthesia was induced by the anesthesiologist.  The right upper extremity was prepped and draped in normal sterile orthopedic fashion. A surgical pause was performed between surgeons, anesthesia, and operating  room staff, and all were in agreement as to the patient, procedure, and site of procedure.  Tourniquet at the proximal aspect of the forearm had been inflated for the Bier block.  An incision was made at the volar aspect of the MP joint of the long finger.  The surgical area was injected with 0.25% plain Marcaine to aid in analgesia.  The wound was carried into the subcutaneous tissues by spreading technique.  The radial and ulnar digital nerves were protected throughout the case. The flexor sheath was identified.  There was a cyst coming through the A1 pulley.  This was slightly more toward the radial side.  The A1 pulley was sharply incised from proximal to distal.  The portion of the A1 pulley involving the cyst was excised.  The cyst was sent to pathology for examination.  The A1 pulley was released in its entirety.  The proximal 1-2 mm of the A2 pulley was vented to allow better excursion of the tendons.  The finger was placed through a range of motion and there was noted to be no catching.  The tendons were brought through the wound and any adherences released.  The wound was then copiously irrigated with sterile saline. It was closed with 4-0 nylon in a horizontal mattress fashion.   It was dressed with sterile Xeroform, 4x4s, and wrapped lightly with a Coban dressing.  Tourniquet was deflated at 21 minutes.  The fingertips were pink with brisk capillary refill after deflation of the tourniquet.  The operative drapes were broken down and the patient was awoken from anesthesia safely.  He was transferred back to the stretcher and taken to the PACU in stable condition.  I will see him back in the office in 1 week for postoperative followup.  I will give him a prescription for Norco 5/325 1-2 tabs PO q6 hours prn pain, dispense #10.    Leanora Cover, MD Electronically signed, 06/14/21

## 2021-06-14 NOTE — Transfer of Care (Signed)
Immediate Anesthesia Transfer of Care Note  Patient: Michael Durham  Procedure(s) Performed: EXCISION RETINACULAR CYST RIGHT MIDDLE FINGER (Right) A1 PULLEY RELEASE RIGHT MIDDLE FINGER (Right: Hand)  Patient Location: PACU  Anesthesia Type:MAC and Bier block  Level of Consciousness: awake, alert  and oriented  Airway & Oxygen Therapy: Patient Spontanous Breathing and Patient connected to face mask oxygen  Post-op Assessment: Report given to RN and Post -op Vital signs reviewed and stable  Post vital signs: Reviewed and stable  Last Vitals:  Vitals Value Taken Time  BP    Temp    Pulse 62 06/14/21 1351  Resp    SpO2 100 % 06/14/21 1351  Vitals shown include unvalidated device data.  Last Pain:  Vitals:   06/14/21 1153  TempSrc: Oral  PainSc: 0-No pain      Patients Stated Pain Goal: 3 (81/85/90 9311)  Complications: No notable events documented.

## 2021-06-14 NOTE — Anesthesia Preprocedure Evaluation (Addendum)
Anesthesia Evaluation  Patient identified by MRN, date of birth, ID band  Reviewed: Allergy & Precautions, NPO status , Patient's Chart, lab work & pertinent test results  Airway Mallampati: II  TM Distance: >3 FB     Dental   Pulmonary former smoker,    breath sounds clear to auscultation       Cardiovascular negative cardio ROS   Rhythm:Regular Rate:Normal     Neuro/Psych PSYCHIATRIC DISORDERS    GI/Hepatic negative GI ROS, Neg liver ROS,   Endo/Other  negative endocrine ROS  Renal/GU negative Renal ROS     Musculoskeletal   Abdominal   Peds  Hematology   Anesthesia Other Findings   Reproductive/Obstetrics                            Anesthesia Physical Anesthesia Plan  ASA: 2  Anesthesia Plan: Bier Block and MAC and Bier Block-LIDOCAINE ONLY   Post-op Pain Management: Regional block*   Induction:   PONV Risk Score and Plan: 1 and Treatment may vary due to age or medical condition  Airway Management Planned: Nasal Cannula and Simple Face Mask  Additional Equipment:   Intra-op Plan:   Post-operative Plan:   Informed Consent: I have reviewed the patients History and Physical, chart, labs and discussed the procedure including the risks, benefits and alternatives for the proposed anesthesia with the patient or authorized representative who has indicated his/her understanding and acceptance.     Dental advisory given  Plan Discussed with: Anesthesiologist and CRNA  Anesthesia Plan Comments:        Anesthesia Quick Evaluation

## 2021-06-14 NOTE — Discharge Instructions (Addendum)

## 2021-06-17 ENCOUNTER — Encounter (HOSPITAL_BASED_OUTPATIENT_CLINIC_OR_DEPARTMENT_OTHER): Payer: Self-pay | Admitting: Orthopedic Surgery

## 2021-06-17 LAB — SURGICAL PATHOLOGY

## 2021-07-03 ENCOUNTER — Other Ambulatory Visit: Payer: Self-pay | Admitting: Family Medicine

## 2021-07-03 DIAGNOSIS — F3181 Bipolar II disorder: Secondary | ICD-10-CM

## 2021-07-10 ENCOUNTER — Telehealth: Payer: Self-pay | Admitting: Family Medicine

## 2021-07-10 NOTE — Telephone Encounter (Signed)
Pt called in a medication request for:  amphetamine-dextroamphetamine (ADDERALL) 20 MG tablet  Please send to  [facility], [place] Phone:  [phone]  Fax:  [phone]     Pt was scheduled to come in on Thursday 07/11/2021

## 2021-07-11 ENCOUNTER — Ambulatory Visit (INDEPENDENT_AMBULATORY_CARE_PROVIDER_SITE_OTHER): Payer: Medicare Other | Admitting: Family Medicine

## 2021-07-11 ENCOUNTER — Encounter: Payer: Self-pay | Admitting: Family Medicine

## 2021-07-11 VITALS — BP 120/76 | HR 74 | Temp 98.0°F | Wt 186.2 lb

## 2021-07-11 DIAGNOSIS — F902 Attention-deficit hyperactivity disorder, combined type: Secondary | ICD-10-CM | POA: Diagnosis not present

## 2021-07-11 MED ORDER — AMPHETAMINE-DEXTROAMPHETAMINE 20 MG PO TABS: 20.0000 mg | ORAL_TABLET | Freq: Two times a day (BID) | ORAL | 0 refills | Status: DC

## 2021-07-11 NOTE — Telephone Encounter (Signed)
Patient was seen at office today. The resquested med was sent in.

## 2021-07-11 NOTE — Progress Notes (Signed)
Subjective:    Patient ID: Michael Durham, male    DOB: 1955-12-31, 66 y.o.   MRN: [id number]  Chief Complaint  Patient presents with   Medication Refill    On Adderall    HPI Patient was seen today for ADHD f/u.  Patient states he is doing well overall.  Stable on Adderall 20 mg twice daily.  Has not had issues getting medicine in the last few months.  Denies changes in appetite, weight, sleep, energy.  States may feel tired during the day but wakes up fairly early.  Patient thinks he was contacted by ENT in regards to appointment for deviated septum, but needs to call the office back. Past Medical History:  Diagnosis Date   ADHD    Allergy    Anxiety    Bipolar 2 disorder ([place])    Depression    Diverticulosis    ED (erectile dysfunction)    Encephalitis, Age 41    HLD (hyperlipidemia)    Hx of colonic polyp - last colonoscopy with Dr. Alinda Sierras on 12/21/13 08/03/2018   Vitamin D deficiency     Allergies  Allergen Reactions   Wellbutrin [Bupropion] Other (See Comments)    Severe headache.   Tetanus Toxoids     "old horse serum preparation"    ROS General: Denies fever, chills, night sweats, changes in weight, changes in appetite HEENT: Denies headaches, ear pain, changes in vision, rhinorrhea, sore throat CV: Denies CP, palpitations, SOB, orthopnea Pulm: Denies SOB, cough, wheezing GI: Denies abdominal pain, nausea, vomiting, diarrhea, constipation GU: Denies dysuria, hematuria, frequency, vaginal discharge Msk: Denies muscle cramps, joint pains Neuro: Denies weakness, numbness, tingling Skin: Denies rashes, bruising Psych: Denies depression, anxiety, hallucinations     Objective:    Blood pressure 120/76, pulse 74, temperature 98 F (36.7 C), temperature source Oral, weight 186 lb 3.2 oz (84.5 kg), SpO2 98 %.  Gen. Pleasant, well-nourished, in no distress, normal affect   HEENT: [place]/AT, face symmetric, conjunctiva clear, no scleral icterus, PERRLA, EOMI, nares  patent without drainage Lungs: no accessory muscle use, CTAB, no wheezes or rales Cardiovascular: RRR, no m/r/g, no peripheral edema Neuro:  A&Ox3, CN II-XII intact, normal gait Skin:  Warm, no lesions/ rash   Wt Readings from Last 3 Encounters:  07/11/21 186 lb 3.2 oz (84.5 kg)  06/14/21 185 lb 6.5 oz (84.1 kg)  06/04/21 185 lb (83.9 kg)    Lab Results  Component Value Date   WBC 8.2 02/17/2020   HGB 15.4 02/17/2020   HCT 46.4 02/17/2020   PLT 286 02/17/2020   GLUCOSE 65 (L) 02/22/2020   CHOL 136 02/22/2020   TRIG 112.0 02/22/2020   HDL 45.90 02/22/2020   LDLCALC 68 02/22/2020   ALT 32 08/03/2018   AST 21 08/03/2018   NA 135 02/22/2020   K 4.8 02/22/2020   CL 99 02/22/2020   CREATININE 1.03 02/22/2020   BUN 12 02/22/2020   CO2 31 02/22/2020   TSH 2.06 03/10/2018   HGBA1C 5.9 08/03/2018    Assessment/Plan:  Attention deficit hyperactivity disorder (ADHD), combined type  -Stable -PDMP reviewed -Continue Adderall 20 mg twice daily. -Refills provided x3 months - Plan: amphetamine-dextroamphetamine (ADDERALL) 20 MG tablet, amphetamine-dextroamphetamine (ADDERALL) 20 MG tablet, amphetamine-dextroamphetamine (ADDERALL) 20 MG tablet  F/u in 3 months  Grier Mitts, MD

## 2021-08-05 ENCOUNTER — Other Ambulatory Visit: Payer: Self-pay | Admitting: Family Medicine

## 2021-08-05 DIAGNOSIS — F3181 Bipolar II disorder: Secondary | ICD-10-CM

## 2021-08-08 NOTE — Telephone Encounter (Signed)
Pt is calling checking on the status of medication. Pt is aware can take up to 3 business days. Pt is going out of town on this sat

## 2021-10-11 ENCOUNTER — Ambulatory Visit: Payer: Medicare Other | Admitting: Family Medicine

## 2021-10-16 ENCOUNTER — Encounter: Payer: Self-pay | Admitting: Family Medicine

## 2021-10-16 ENCOUNTER — Ambulatory Visit (INDEPENDENT_AMBULATORY_CARE_PROVIDER_SITE_OTHER): Payer: Medicare Other | Admitting: Family Medicine

## 2021-10-16 VITALS — BP 132/78 | HR 76 | Temp 98.4°F | Wt 201.2 lb

## 2021-10-16 DIAGNOSIS — L570 Actinic keratosis: Secondary | ICD-10-CM

## 2021-10-16 DIAGNOSIS — G4700 Insomnia, unspecified: Secondary | ICD-10-CM

## 2021-10-16 DIAGNOSIS — F900 Attention-deficit hyperactivity disorder, predominantly inattentive type: Secondary | ICD-10-CM

## 2021-10-16 DIAGNOSIS — F3181 Bipolar II disorder: Secondary | ICD-10-CM

## 2021-10-16 DIAGNOSIS — R7303 Prediabetes: Secondary | ICD-10-CM

## 2021-10-16 DIAGNOSIS — R351 Nocturia: Secondary | ICD-10-CM | POA: Diagnosis not present

## 2021-10-16 LAB — POCT GLYCOSYLATED HEMOGLOBIN (HGB A1C): Hemoglobin A1C: 5.6 % (ref 4.0–5.6)

## 2021-10-16 LAB — POCT URINALYSIS DIPSTICK
Bilirubin, UA: NEGATIVE
Blood, UA: NEGATIVE
Glucose, UA: NEGATIVE
Ketones, UA: NEGATIVE
Leukocytes, UA: NEGATIVE
Nitrite, UA: NEGATIVE
Protein, UA: NEGATIVE
Spec Grav, UA: 1.01 (ref 1.010–1.025)
Urobilinogen, UA: NEGATIVE E.U./dL — AB
pH, UA: 7 (ref 5.0–8.0)

## 2021-10-16 MED ORDER — ESCITALOPRAM OXALATE 20 MG PO TABS: ORAL_TABLET | ORAL | 1 refills | Status: DC

## 2021-10-16 MED ORDER — AMPHETAMINE-DEXTROAMPHET ER 20 MG PO CP24: 20.0000 mg | ORAL_CAPSULE | Freq: Every day | ORAL | 0 refills | Status: DC

## 2021-10-16 NOTE — Progress Notes (Signed)
Subjective:    Patient ID: Michael Durham, male    DOB: 08-20-1955, 66 y.o.   MRN: [id number]  Chief Complaint  Patient presents with   Follow-up    Med and other questions. RSV and another med    HPI Patient is a 66 year old male with PMH SIG for ADHD, anxiety, bipolar 2 disorder, depression, ED, HLD, vitamin D deficiency who was seen today for f/u and med refill.  Patient inquires about time-released Adderall.  Currently taking Adderall 30 mg in a.m. but feels like he is "crashing" by 7:30 PM unless he takes a nap.  Patient's prescription for Adderall was written as 20 mg twice daily.  States has been taking the 30 mg daily dose times years.  Patient previously seen by [facility] provider in [place] several years ago.  Taking Abilify 5 mg daily and Lexapro 20 mg daily for h/o Bipolar d/o  Pt endorses becoming irritated when thinking about his ex-wife and his former boss.  Patient endorses difficulty staying asleep at night as waking up several times to urinate.  Patient endorses drinking caffeine throughout the day until 4:00 PM.  Urinating every hour.  Denies difficulty initially falling asleep.  In bed around 9-10 PM.  Patient has a dry appearing lesion on the right posterior shoulder that he states is annoying.  At times the area get stuck on shirt.  Patient denies itching, bleeding, erythema.  Past Medical History:  Diagnosis Date   ADHD    Allergy    Anxiety    Bipolar 2 disorder ([place])    Depression    Diverticulosis    ED (erectile dysfunction)    Encephalitis, Age 75    HLD (hyperlipidemia)    Hx of colonic polyp - last colonoscopy with Dr. Alinda Sierras on 12/21/13 08/03/2018   Vitamin D deficiency     Allergies  Allergen Reactions   Wellbutrin [Bupropion] Other (See Comments)    Severe headache.   Tetanus Toxoids     "old horse serum preparation"    ROS General: Denies fever, chills, night sweats, changes in weight, changes in appetite HEENT: Denies headaches, ear pain,  changes in vision, rhinorrhea, sore throat CV: Denies CP, palpitations, SOB, orthopnea Pulm: Denies SOB, cough, wheezing GI: Denies abdominal pain, nausea, vomiting, diarrhea, constipation GU: Denies dysuria, hematuria + nocturia, frequency Msk: Denies muscle cramps, joint pains Neuro: Denies weakness, numbness, tingling Skin: Denies rashes, bruising + skin lesion on right shoulder Psych: Denies depression, anxiety, hallucinations + insomnia, inattention     Objective:    Blood pressure 132/78, pulse 76, temperature 98.4 F (36.9 C), temperature source Oral, weight 201 lb 3.2 oz (91.3 kg), SpO2 98 %.  Gen. Pleasant, well-nourished, in no distress, normal affect   HEENT: [place]/AT, face symmetric, conjunctiva clear, no scleral icterus, PERRLA, EOMI, nares patent without drainage Lungs: no accessory muscle use, CTAB, no wheezes or rales Cardiovascular: RRR, no m/r/g, no peripheral edema Neuro:  A&Ox3, CN II-XII intact, normal gait Skin:  Warm, dry, intact.  Dry, raised actinic keratosis lesion on posterior right shoulder.  Cryotherapy applied to lesion.   Wt Readings from Last 3 Encounters:  10/16/21 201 lb 3.2 oz (91.3 kg)  07/11/21 186 lb 3.2 oz (84.5 kg)  06/14/21 185 lb 6.5 oz (84.1 kg)    Lab Results  Component Value Date   WBC 8.2 02/17/2020   HGB 15.4 02/17/2020   HCT 46.4 02/17/2020   PLT 286 02/17/2020   GLUCOSE 65 (L) 02/22/2020   CHOL  136 02/22/2020   TRIG 112.0 02/22/2020   HDL 45.90 02/22/2020   LDLCALC 68 02/22/2020   ALT 32 08/03/2018   AST 21 08/03/2018   NA 135 02/22/2020   K 4.8 02/22/2020   CL 99 02/22/2020   CREATININE 1.03 02/22/2020   BUN 12 02/22/2020   CO2 31 02/22/2020   TSH 2.06 03/10/2018   HGBA1C 5.9 08/03/2018      07/11/2021    3:39 PM 04/29/2021    8:51 AM 04/18/2021   10:45 AM  Depression screen PHQ 2/9  Decreased Interest 0 0 1  Down, Depressed, Hopeless 1 0 0  PHQ - 2 Score 1 0 1  Altered sleeping   1  Tired, decreased energy 2  1   Change in appetite 0  0  Feeling bad or failure about yourself  0  0  Trouble concentrating 0  1  Moving slowly or fidgety/restless 0  0  Suicidal thoughts 0  0  PHQ-9 Score   4  Difficult doing work/chores Not difficult at all  Not difficult at all    Cryotherapy  Reason: Pt discomfort when actinic keratosis lesion get stuck on clothes Location: Right posterior shoulder  Consent obtained.  R/b/a reviewed.  Liquid nitrogen was applied using the liquid nitrogen gun without difficulty. Tolerated well without complications.  After care reviewed.  Assessment/Plan:  Attention deficit hyperactivity disorder (ADHD), predominantly inattentive type -Stable -As patient wishes to try extended release Adderall we will send in prescription rx.  Some concern may cause patient to have insomnia is currently regular release formulation last until about 7:30 PM. -PDMP reviewed -Rx for Adderall XR 20 mg x 3 months sent to pharmacy  - Plan: amphetamine-dextroamphetamine (ADDERALL XR) 20 MG 24 hr capsule  Insomnia, unspecified type -Sleep hygiene -Decrease sleep likely 2/2 nocturia.  Patient advised to decrease caffeine intake and avoid drinking caffeine late in the day. -Discussed follow-up with BH  Nocturia -Discussed possible causes including hyperglycemia, increased BPH symptoms  - Plan: POCT urinalysis dipstick, POC HgB A1c  Actinic keratosis -Consent obtained.  Discussed r/b/a.  Cryotherapy applied to lesion.  Patient tolerated procedure well. -Given handout -Continue to monitor  Prediabetes -Hemoglobin A1c 5.9% on 08/03/2018 -We will recheck A1c as may be contributing to nocturia.    - Plan: POC HgB A1c  Bipolar 2 disorder (HCC) -Stable -Continue Abilify 5 mg daily and Lexapro 20 mg daily. -Patient encouraged to find [facility] provider to start following up with for medication management and counseling. -Plan: Lexapro 20 mg daily.  F/u in 1-3 months, sooner if needed.  Grier Mitts,  MD

## 2021-11-04 ENCOUNTER — Other Ambulatory Visit: Payer: Self-pay | Admitting: Family Medicine

## 2021-11-04 DIAGNOSIS — F3181 Bipolar II disorder: Secondary | ICD-10-CM

## 2021-11-09 ENCOUNTER — Encounter: Payer: Self-pay | Admitting: Family Medicine

## 2021-12-03 ENCOUNTER — Encounter: Payer: Self-pay | Admitting: Family Medicine

## 2021-12-18 NOTE — Telephone Encounter (Signed)
Pt called to FU and is still waiting for a response to this message &/or the refill for the Adderall - Please advise.  LOV:  10/16/21  Please send the Rx to  [place], [place] Phone: [phone]  Fax: [phone]

## 2022-01-17 ENCOUNTER — Other Ambulatory Visit: Payer: Self-pay | Admitting: Family Medicine

## 2022-01-17 DIAGNOSIS — F902 Attention-deficit hyperactivity disorder, combined type: Secondary | ICD-10-CM

## 2022-01-17 MED ORDER — AMPHETAMINE-DEXTROAMPHETAMINE 20 MG PO TABS: 20.0000 mg | ORAL_TABLET | Freq: Two times a day (BID) | ORAL | 0 refills | Status: DC

## 2022-01-30 ENCOUNTER — Other Ambulatory Visit: Payer: Self-pay | Admitting: Family Medicine

## 2022-01-30 DIAGNOSIS — F3181 Bipolar II disorder: Secondary | ICD-10-CM

## 2022-02-07 ENCOUNTER — Encounter: Payer: Self-pay | Admitting: Family Medicine

## 2022-02-07 ENCOUNTER — Ambulatory Visit (INDEPENDENT_AMBULATORY_CARE_PROVIDER_SITE_OTHER): Payer: Medicare Other | Admitting: Family Medicine

## 2022-02-07 VITALS — BP 134/80 | HR 63 | Temp 97.8°F | Ht 69.0 in | Wt 194.0 lb

## 2022-02-07 DIAGNOSIS — N529 Male erectile dysfunction, unspecified: Secondary | ICD-10-CM | POA: Diagnosis not present

## 2022-02-07 DIAGNOSIS — L858 Other specified epidermal thickening: Secondary | ICD-10-CM

## 2022-02-07 DIAGNOSIS — F902 Attention-deficit hyperactivity disorder, combined type: Secondary | ICD-10-CM

## 2022-02-07 DIAGNOSIS — N401 Enlarged prostate with lower urinary tract symptoms: Secondary | ICD-10-CM | POA: Diagnosis not present

## 2022-02-07 DIAGNOSIS — R3989 Other symptoms and signs involving the genitourinary system: Secondary | ICD-10-CM

## 2022-02-07 DIAGNOSIS — F3181 Bipolar II disorder: Secondary | ICD-10-CM

## 2022-02-07 DIAGNOSIS — G4700 Insomnia, unspecified: Secondary | ICD-10-CM

## 2022-02-07 DIAGNOSIS — R351 Nocturia: Secondary | ICD-10-CM

## 2022-02-07 DIAGNOSIS — R0981 Nasal congestion: Secondary | ICD-10-CM

## 2022-02-07 DIAGNOSIS — J342 Deviated nasal septum: Secondary | ICD-10-CM

## 2022-02-07 LAB — COMPREHENSIVE METABOLIC PANEL
ALT: 16 U/L (ref 0–53)
AST: 19 U/L (ref 0–37)
Albumin: 4.4 g/dL (ref 3.5–5.2)
Alkaline Phosphatase: 73 U/L (ref 39–117)
BUN: 10 mg/dL (ref 6–23)
CO2: 26 mEq/L (ref 19–32)
Calcium: 9.5 mg/dL (ref 8.4–10.5)
Chloride: 100 mEq/L (ref 96–112)
Creatinine, Ser: 0.95 mg/dL (ref 0.40–1.50)
GFR: 82.22 mL/min (ref >60.00)
Glucose, Bld: 87 mg/dL (ref 70–99)
Potassium: 3.9 mEq/L (ref 3.5–5.1)
Sodium: 136 mEq/L (ref 135–145)
Total Bilirubin: 0.3 mg/dL (ref 0.2–1.2)
Total Protein: 7.5 g/dL (ref 6.0–8.3)

## 2022-02-07 LAB — POCT URINALYSIS DIPSTICK
Bilirubin, UA: NEGATIVE
Blood, UA: NEGATIVE
Glucose, UA: NEGATIVE
Ketones, UA: NEGATIVE
Nitrite, UA: NEGATIVE
Protein, UA: NEGATIVE
Spec Grav, UA: 1.01 (ref 1.010–1.025)
Urobilinogen, UA: 0.2 E.U./dL
pH, UA: 7.5 (ref 5.0–8.0)

## 2022-02-07 LAB — TSH: TSH: 1.87 u[IU]/mL (ref 0.35–5.50)

## 2022-02-07 MED ORDER — ESCITALOPRAM OXALATE 20 MG PO TABS: ORAL_TABLET | ORAL | 1 refills | Status: DC

## 2022-02-07 MED ORDER — AMPHETAMINE-DEXTROAMPHETAMINE 20 MG PO TABS: 20.0000 mg | ORAL_TABLET | Freq: Two times a day (BID) | ORAL | 0 refills | Status: DC

## 2022-02-07 MED ORDER — TAMSULOSIN HCL 0.4 MG PO CAPS: 0.4000 mg | ORAL_CAPSULE | Freq: Every day | ORAL | 3 refills | Status: DC

## 2022-02-07 MED ORDER — TADALAFIL 5 MG PO TABS: ORAL_TABLET | ORAL | 2 refills | Status: DC

## 2022-02-07 MED ORDER — ARIPIPRAZOLE 5 MG PO TABS: 5.0000 mg | ORAL_TABLET | Freq: Every day | ORAL | 0 refills | Status: DC

## 2022-02-07 NOTE — Patient Instructions (Signed)
Referrals to ENT, neurology, psychiatry, and dermatology were placed.  You should expect a phone call about scheduling these appointments.  A prescription for Flomax sent to your pharmacy.  You can try this to see if it helps with urinary symptoms.  Refills provided for your regular medications.

## 2022-02-07 NOTE — Progress Notes (Unsigned)
   Established Patient Office Visit   Subjective  Patient ID: Michael Durham, male    DOB: 1955/08/17  Age: 67 y.o. MRN: [id number]  Chief Complaint  Patient presents with  . Medical Management of Chronic Issues    Follow up on ADHD.   Marland Kitchen Medication Refill  . Skin Concern    Pt reports Dr. Volanda Napoleon did a minor procedure on skin of his back last visit. It had grew back. Noticed it few weeks ago.     HPI  {History (Optional):23778}  ROS Negative unless stated above    Objective:     BP 134/80 (BP Location: Left Arm, Patient Position: Sitting, Cuff Size: Large)   Pulse 63   Temp 97.8 F (36.6 C) (Oral)   Ht '5\' 9"'$  (1.753 m)   Wt 194 lb (88 kg)   SpO2 98%   BMI 28.65 kg/m  {Vitals History (Optional):23777}  Physical Exam   No results found for any visits on 02/07/22.    Assessment & Plan:  Attention deficit hyperactivity disorder (ADHD), combined type -     Amphetamine-Dextroamphetamine; Take 1 tablet (20 mg total) by mouth 2 (two) times daily.  Dispense: 60 tablet; Refill: 0 -     Ambulatory referral to Psychiatry -     Amphetamine-Dextroamphetamine; Take 1 tablet (20 mg total) by mouth 2 (two) times daily.  Dispense: 60 tablet; Refill: 0 -     Amphetamine-Dextroamphetamine; Take 1 tablet (20 mg total) by mouth 2 (two) times daily.  Dispense: 60 tablet; Refill: 0  Bipolar 2 disorder (HCC) -     ARIPiprazole; Take 1 tablet (5 mg total) by mouth at bedtime.  Dispense: 90 tablet; Refill: 0 -     Escitalopram Oxalate; Take 1 tablet by mouth once daily  Dispense: 90 tablet; Refill: 1 -     Ambulatory referral to Psychiatry -     TSH  Erectile dysfunction, unspecified erectile dysfunction type -     Tadalafil; 1-3 tabs po daily as needed.  Dispense: 30 tablet; Refill: 2 -     Ambulatory referral to Urology -     Comprehensive metabolic panel -     TSH  Cutaneous horn -     Ambulatory referral to Dermatology  Benign prostatic hyperplasia with nocturia -      Tamsulosin HCl; Take 1 capsule (0.4 mg total) by mouth daily.  Dispense: 30 capsule; Refill: 3 -     Ambulatory referral to Urology -     POCT urinalysis dipstick -     Comprehensive metabolic panel  Deviated septum -     Ambulatory referral to ENT  Chronic nasal congestion -     Ambulatory referral to ENT  Insomnia, unspecified type -     Ambulatory referral to Psychiatry    Return in about 3 months (around 05/08/2022).   Billie Ruddy, MD

## 2022-02-08 LAB — URINE CULTURE
MICRO NUMBER:: 14511725
Result:: NO GROWTH
SPECIMEN QUALITY:: ADEQUATE

## 2022-03-27 ENCOUNTER — Ambulatory Visit (HOSPITAL_COMMUNITY): Payer: Self-pay | Admitting: Psychiatry

## 2022-04-09 ENCOUNTER — Ambulatory Visit (HOSPITAL_BASED_OUTPATIENT_CLINIC_OR_DEPARTMENT_OTHER): Payer: Medicare Other | Admitting: Psychiatry

## 2022-04-09 ENCOUNTER — Encounter (HOSPITAL_COMMUNITY): Payer: Self-pay | Admitting: Psychiatry

## 2022-04-09 VITALS — Wt 194.0 lb

## 2022-04-09 DIAGNOSIS — F331 Major depressive disorder, recurrent, moderate: Secondary | ICD-10-CM

## 2022-04-09 DIAGNOSIS — F900 Attention-deficit hyperactivity disorder, predominantly inattentive type: Secondary | ICD-10-CM | POA: Diagnosis not present

## 2022-04-09 NOTE — Progress Notes (Signed)
Psychiatric Initial Adult Assessment   Patient Location: Home Provider Location: Home Office  I connect with patient by video and verified that I am speaking with correct person by using two identifiers. I discussed the limitations of evaluation and management by telemedicine and the availability of in person appointments. I also discussed with the patient that there may be a patient responsible charge related to this service. The patient expressed understanding and agreed to proceed.  Patient Identification: Michael Durham MRN:  [id number] Date of Evaluation:  04/09/2022  Referral Source: PCP  Chief Complaint:   Chief Complaint  Patient presents with   Establish Care   Depression   Visit Diagnosis:    ICD-10-CM   1. Recurrent major depressive episodes, moderate  F33.1     2. Attention deficit hyperactivity disorder (ADHD), predominantly inattentive type  F90.0       History of Present Illness: Patient is 67 year old married, retired man who is referred from primary care physician for ongoing management of his depression and anxiety.  He reported lately not motivated to do things.  He reported lack of energy, motivation, feeling tired and sometimes he has guilt about his past.  He reported the symptoms started even a half ago.  He is taking Abilify, Lexapro and Adderall.  He was given the diagnosis of bipolar disorder, depression and ADHD.  He is taking these medication for more than 5 years.  Denies any feeling of hopelessness, crying spells, suicidal thoughts, lack of appetite, paranoia, hallucination or any homicidal thoughts.  He admitted sometimes get frustrated and irritable but no specific stressors.  He admitted sometimes ruminative thoughts about his past especially about his 65 year old daughter who lives in [place] but he is not involved in her family.  Patient told daughter is from his first marriage and she was raised by her mother when she was 44 years old.  He only  communicated to face book.  Patient reported some time thinks about his previous marriages.  This is his third marriage and he is very happy with his current wife who he has been married for 13 years.  His first marriage lasted for only 3 years and second marriage lasted for 19 years.  He has a daughter from his first marriage.  Patient not sure if the current medicine is working very well.  He was seen Williemae Area and Mrs. Bonnita Nasuti for many years until 5 years ago she left and medicines were given by primary care doctor.  Patient denies any history of physical sexual or verbal abuse.  However he does have nightmares and sometimes flashbacks about his previous marriage.  Denies drinking or any illegal substance use.  He admitted weight fluctuation but denies any lack of appetite.  He has a brother who lives in [place] and he is very close to him.  His younger brother is in [place] and has schizophrenia.  Patient does help him a lot but lately he is not sure how long he can help him because brother does not take any responsibility.  Patient reported he used to watch TV shows but lately he has not done because he feels tired and fatigued.  He enjoys playing guitar which he do on a regular basis and he listened to the music and play music with his brother who lives in [place].  Patient also told that he had a history of low vitamin D and he used to take over-the-counter vitamin D supplement but has not done his blood work  to check his vitamin D level.  He is currently not taking any vitamin D.  He had a blood work month ago.  His hemoglobin A1c 5.6, basic chemistry and thyroid were normal.  He denies any head trauma, headaches.  He denies any seizures or chronic pain.  He reported stimulant helps his focus attention and concentration.  He has no tremor or shakes or any EPS.  He saw a counselor and therapist in the past but has not done in a while.  Associated Signs/Symptoms: Depression Symptoms:   depressed mood, fatigue, difficulty concentrating, loss of energy/fatigue, (Hypo) Manic Symptoms:  Irritable Mood, Anxiety Symptoms:   anxiety Psychotic Symptoms:   no psychotic symtoms PTSD Symptoms: NA  Past Psychiatric History: Mother has severe depression.  Brother has schizophrenia who lives in [place].  No history of suicidal attempt, psychosis.  Given the diagnosis of bipolar disorder because he has some highs and lows and getting easily upset and irritable.  He was given Abilify that helped those symptoms.  He was diagnosed with ADHD in 2008 when career counselor recommended to do testing because at that time he was struggling with focus attention concentration.  He is taking stimulants since then.  No history of drug use, legal issues.  He recalled taking Wellbutrin twice but both times he had a headache and he stopped.  He was seeing nurse practitioner Shelbie Proctor in [place] until she left the practice.  Previous Psychotropic Medications: Yes   Substance Abuse History in the last 12 months:  No.  Consequences of Substance Abuse: NA  Past Medical History:  Past Medical History:  Diagnosis Date   ADHD    Allergy    Anxiety    Bipolar 2 disorder ([place])    Depression    Diverticulosis    ED (erectile dysfunction)    Encephalitis, Age 3    HLD (hyperlipidemia)    Hx of colonic polyp - last colonoscopy with Dr. Alinda Sierras on 12/21/13 08/03/2018   Vitamin D deficiency     Past Surgical History:  Procedure Laterality Date   BRAIN SURGERY     COLONOSCOPY     CYST EXCISION Right 06/14/2021   Procedure: EXCISION RETINACULAR CYST RIGHT MIDDLE FINGER;  Surgeon: Leanora Cover, MD;  Location: [facility];  Service: Orthopedics;  Laterality: Right;  30 MIN   MOUTH SURGERY     TOE SURGERY Left    L big toe   TRIGGER FINGER RELEASE Right 06/14/2021   Procedure: A1 PULLEY RELEASE RIGHT MIDDLE FINGER;  Surgeon: Leanora Cover, MD;  Location: [facility];   Service: Orthopedics;  Laterality: Right;    Family Psychiatric History: Brother has mental disorder. Mother had depression.  Family History:  Family History  Problem Relation Age of Onset   Dementia Mother    Prostate cancer Father    Bladder Cancer Father    Diabetes Brother    Breast cancer Maternal Aunt    Esophageal cancer Neg Hx    Stomach cancer Neg Hx    Pancreatic cancer Neg Hx    Inflammatory bowel disease Neg Hx    Liver disease Neg Hx    Rectal cancer Neg Hx    Colon cancer Neg Hx     Social History:   Social History   Socioeconomic History   Marital status: Married    Spouse name: Not on file   Number of children: Not on file   Years of education: Not on file  Highest education level: Some college, no degree  Occupational History   Not on file  Tobacco Use   Smoking status: Former   Smokeless tobacco: Never   Tobacco comments:    Quit > 15 years ago, smoked 1-2 ppd for 30 years  Vaping Use   Vaping Use: Never used  Substance and Sexual Activity   Alcohol use: Yes    Comment: occasionally   Drug use: No   Sexual activity: Yes    Partners: Female    Birth control/protection: None  Other Topics Concern   Not on file  Social History Narrative   Not on file   Social Determinants of Health   Financial Resource Strain: Low Risk  (04/29/2021)   Overall Financial Resource Strain (CARDIA)    Difficulty of Paying Living Expenses: Not hard at all  Food Insecurity: No Food Insecurity (10/08/2021)   Hunger Vital Sign    Worried About Running Out of Food in the Last Year: Never true    Ran Out of Food in the Last Year: Never true  Transportation Needs: No Transportation Needs (10/08/2021)   PRAPARE - Hydrologist (Medical): No    Lack of Transportation (Non-Medical): No  Physical Activity: Inactive (10/08/2021)   Exercise Vital Sign    Days of Exercise per Week: 0 days    Minutes of Exercise per Session: 0 min  Stress: Stress  Concern Present (10/08/2021)   [place]    Feeling of Stress : Very much  Social Connections: Moderately Integrated (10/08/2021)   Social Connection and Isolation Panel [NHANES]    Frequency of Communication with Friends and Family: Twice a week    Frequency of Social Gatherings with Friends and Family: Once a week    Attends Religious Services: 1 to 4 times per year    Active Member of [employer] or Organizations: No    Attends Music therapist: Not on file    Marital Status: Married    Additional Social History: Patient born in [place] and grew up there and moved to [place] in 1988 after he find a job in [employer].  He reported finished the school but did not complete the college.  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  He worked for [employer] for 19 years until the company was bought by another company and he was laid off.  Patient lives with his wife for the past 13 years.  This is his third marriage.  Patient told his first marriage lasted for 3 years and fall apart and he had a daughter but he was not involved while she was growing since age 41.  His daughter lives in [place] and he communicated through Facebook with her and the grandkids.  His second marriage lasted for 19 years.  Allergies:   Allergies  Allergen Reactions   Wellbutrin [Bupropion] Other (See Comments)    Severe headache.   Tetanus Toxoids     "old horse serum preparation"    Metabolic Disorder Labs: Lab Results  Component Value Date   HGBA1C 5.6 10/16/2021   No results found for: "PROLACTIN" Lab Results  Component Value Date   CHOL 136 02/22/2020   TRIG 112.0 02/22/2020   HDL 45.90 02/22/2020   CHOLHDL 3 02/22/2020   VLDL 22.4 02/22/2020   LDLCALC 68 02/22/2020   LDLCALC 57 08/03/2018   Lab Results  Component Value Date  TSH 1.87 02/07/2022    Therapeutic Level Labs: No  results found for: "LITHIUM" No results found for: "CBMZ" No results found for: "VALPROATE"  Current Medications: Current Outpatient Medications  Medication Sig Dispense Refill   amphetamine-dextroamphetamine (ADDERALL) 20 MG tablet Take 1 tablet (20 mg total) by mouth 2 (two) times daily. 60 tablet 0   amphetamine-dextroamphetamine (ADDERALL) 20 MG tablet Take 1 tablet (20 mg total) by mouth 2 (two) times daily. 60 tablet 0   amphetamine-dextroamphetamine (ADDERALL) 20 MG tablet Take 1 tablet (20 mg total) by mouth 2 (two) times daily. 60 tablet 0   amphetamine-dextroamphetamine (ADDERALL) 20 MG tablet Take 1 tablet (20 mg total) by mouth 2 (two) times daily. 60 tablet 0   ARIPiprazole (ABILIFY) 5 MG tablet Take 1 tablet (5 mg total) by mouth at bedtime. 90 tablet 0   aspirin EC 81 MG tablet Take 1 tablet (81 mg total) by mouth daily. Swallow whole. 90 tablet 3   Cholecalciferol (VITAMIN D) 2000 units CAPS Take 2,000 Units by mouth daily.     escitalopram (LEXAPRO) 20 MG tablet Take 1 tablet by mouth once daily 90 tablet 1   latanoprost (XALATAN) 0.005 % ophthalmic solution INSTILL 1 DROP INTO EACH EYE AT BEDTIME     Omega-3 Fatty Acids (FISH OIL) 1200 MG CAPS Take 1 capsule by mouth daily at 12 noon.     rosuvastatin (CRESTOR) 10 MG tablet Take 1 tablet (10 mg total) by mouth 2 (two) times a week. 30 tablet 3   tadalafil (CIALIS) 5 MG tablet 1-3 tabs po daily as needed. 30 tablet 2   tamsulosin (FLOMAX) 0.4 MG CAPS capsule Take 1 capsule (0.4 mg total) by mouth daily. 30 capsule 3   No current facility-administered medications for this visit.    Musculoskeletal: Strength & Muscle Tone: within normal limits Gait & Station: normal Patient leans: N/A  Psychiatric Specialty Exam: Review of Systems  Weight 194 lb (88 kg).There is no height or weight on file to calculate BMI.  General Appearance: Casual  Eye Contact:  Good  Speech:  Normal Rate  Volume:  Decreased  Mood:  Dysphoric   Affect:  Appropriate  Thought Process:  Goal Directed  Orientation:  Full (Time, Place, and Person)  Thought Content:  Rumination  Suicidal Thoughts:  No  Homicidal Thoughts:  No  Memory:  Immediate;   Good Recent;   Good Remote;   Fair  Judgement:  Intact  Insight:  Present  Psychomotor Activity:  Decreased  Concentration:  Concentration: Good and Attention Span: Fair  Recall:  Good  Fund of Knowledge:Good  Language: Good  Akathisia:  No  Handed:  Right  AIMS (if indicated):  not done  Assets:  Communication Skills Desire for Improvement Housing Social Support Transportation  ADL's:  Intact  Cognition: WNL  Sleep:  Good   Screenings: GAD-7    Flowsheet Row Office Visit from 10/17/2020 in [facility] at [facility] from 11/25/2018 in [facility] at [facility]  Total GAD-7 Score 1 0      PHQ2-9    [place] Office Visit from 07/11/2021 in [facility] from 04/29/2021 in [facility] from 04/18/2021 in [facility] from 01/18/2021 in [facility] from 10/17/2020 in [facility] at [facility] Total Score 1 0 1 2 1  PHQ-9 Total Score -- -- 4 7 6       Flowsheet Row Admission (Discharged) from 06/14/2021 in [facility] No Risk       Assessment and Plan: Patient is 67 year old married, retired man with history of ADHD, given the diagnosis of bipolar disorder and depression.  Currently he is taking Abilify 5 mg, Lexapro 20 mg and Adderall 20 mg 2 times a day.  Though he has no somatic symptoms as complaining of feeling tired, fatigue, lack of motivation but denies any feeling of hopelessness, worthlessness, suicidal thoughts, mania, psychosis or crying spells.  He had some issues with insomnia but  lately he is sleeping better.  He has a history of low vitamin D.  I recommend he should complete the blood work and repeat the vitamin D levels, testosterone level, folic acid to see if he has organic cause for fatigue lack of energy.  If all labs are normal and he still feel tired we could try switching his antidepressant.  The possibilities are Trintellix, Viibryd since he has not tried medication in the past.  Also recommend should consider seeing a therapist to help his coping skills as he had some ruminative and guilt about his past.  Discussed safety concerns and any time having active suicidal thoughts or homicidal halogen need to call 911 or go to local emergency room.  Follow-up in 4 weeks.  I will also forward my note to his primary care physician.    Collaboration of Care: Other provider involved in patient's care AEB I will forward my note to his primary care doctor.  Patient/Guardian was advised Release of Information must be obtained prior to any record release in order to collaborate their care with an outside provider. Patient/Guardian was advised if they have not already done so to contact the registration department to sign all necessary forms in order for [place] to release information regarding their care.   Consent: Patient/Guardian gives verbal consent for treatment and assignment of benefits for services provided during this visit. Patient/Guardian expressed understanding and agreed to proceed.   Kathlee Nations, MD 4/3/20249:05 AM

## 2022-04-22 ENCOUNTER — Encounter: Payer: Self-pay | Admitting: Family Medicine

## 2022-04-23 ENCOUNTER — Encounter: Payer: Self-pay | Admitting: Family Medicine

## 2022-04-28 ENCOUNTER — Telehealth: Payer: Self-pay | Admitting: Family Medicine

## 2022-04-28 NOTE — Telephone Encounter (Signed)
Contacted Rachel Moulds to schedule their annual wellness visit. Appointment made for 05/06/22.  Rudell Cobb AW[facility] direct phone # [phone]   Due to schedule change  r/s 4/29 awv appt to 4/30 pt aware of appt date/time chang e

## 2022-05-05 ENCOUNTER — Ambulatory Visit: Payer: Medicare Other

## 2022-05-06 ENCOUNTER — Encounter: Payer: Self-pay | Admitting: Family Medicine

## 2022-05-06 ENCOUNTER — Ambulatory Visit (INDEPENDENT_AMBULATORY_CARE_PROVIDER_SITE_OTHER): Payer: Medicare Other | Admitting: Family Medicine

## 2022-05-06 DIAGNOSIS — Z0000 Encounter for general adult medical examination without abnormal findings: Secondary | ICD-10-CM

## 2022-05-06 NOTE — Progress Notes (Signed)
PATIENT CHECK-IN and HEALTH RISK ASSESSMENT QUESTIONNAIRE:  -completed by phone/video for upcoming Medicare Preventive Visit  Pre-Visit Check-in: 1)Vitals (height, wt, BP, etc) - record in vitals section for visit on day of visit 2)Review and Update Medications, Allergies PMH, Surgeries, Social history in Epic 3)Hospitalizations in the last year with date/reason? no  4)Review and Update Care Team (patient's specialists) in Epic 5) Complete PHQ9 in Epic  6) Complete Fall Screening in Epic 7)Review all Health Maintenance Due and order under PCP if not done.  Medicare Wellness Patient Questionnaire:  Answer theses question about your habits: Do you drink alcohol? No  If yes, how many drinks do you have a day?N/A Have you ever smoked?yes Quit date if applicable? 15-20 years ago  How many packs a day do/did you smoke? 1.5 pack a day Do you use smokeless tobacco?no Do you use an illicit drugs?yes-marijuana occasionally Do you exercises? Yes IF so, what type and how many days/minutes per week?upper body exercises 2 times a week - push ups Are you sexually active? No Number of partners? Typical breakfast-skips breakfast often Typical lunch-nothing Typical dinner-varies, may have chicken, beef or salads Typical snacks: Sweet tarts, praline pecans  Beverages: water, coffee, occasional soda  Answer theses question about you: Can you perform most household chores?no-has cleaner once a month Do you find it hard to follow a conversation in a noisy room?yes, had audiology exam Do you often ask people to speak up or repeat themselves?no Do you feel that you have a problem with memory? no Do you balance your checkbook and or bank acounts?yes Do you feel safe at home?yes Last dentist visit?1 year ago Do you need assistance with any of the following: Please note if so   Driving? no  Feeding yourself? no  Getting from bed to chair? no  Getting to the toilet? no  Bathing or showering?  no  Dressing yourself? no  Managing money? No   Climbing a flight of stairs-no  Preparing meals? no    Do you have Advanced Directives in place (Living Will, Healthcare Power or Attorney)? yes   Last eye Exam and location? Cannot recall physicians name-6 months ago   Do you currently use prescribed or non-prescribed narcotic or opioid pain medications?no  Do you have a history or close family history of breast, ovarian, tubal or peritoneal cancer or a family member with BRCA (breast cancer susceptibility 1 and 2) gene mutations? no  Nurse/Assistant Credentials/time stamp: Mellody Drown   ----------------------------------------------------------------------------------------------------------------------------------------------------------------------------------------------------------------------    MEDICARE ANNUAL PREVENTIVE CARE VISIT WITH PROVIDER (Welcome to Medicare, initial annual wellness or annual wellness exam)  Virtual Visit via Video  Note  I connected with Michael Durham on 05/06/22  by a video enabled telemedicine application and verified that I am speaking with the correct person using two identifiers.  Location patient: home Location provider:work or home office Persons participating in the virtual visit: patient, provider  Concerns and/or follow up today: no concerns   See HM section in Epic for other details of completed HM.    ROS: negative for report of fevers, unintentional weight loss, vision changes, vision loss, hearing loss or change, chest pain, sob, hemoptysis, melena, hematochezia, hematuria, falls, bleeding or bruising, thoughts of suicide or self harm, memory loss  Patient-completed extensive health risk assessment - reviewed and discussed with the patient: See Health Risk Assessment completed with patient prior to the visit either above or in recent phone note. This was reviewed in detailed with the patient  today and appropriate  recommendations, orders and referrals were placed as needed per Summary below and patient instructions.   Review of Medical History: -PMH, PSH, Family History and current specialty and care providers reviewed and updated and listed below   Patient Care Team: Deeann Saint, MD as PCP - General (Family Medicine) Jake Bathe, MD as PCP - Cardiology (Cardiology)   Past Medical History:  Diagnosis Date   ADHD    Allergy    Anxiety    Bipolar 2 disorder [facility])    Depression    Diverticulosis    ED (erectile dysfunction)    Encephalitis, Age 24    HLD (hyperlipidemia)    Hx of colonic polyp - last colonoscopy with Dr. Salena Saner on 12/21/13 08/03/2018   Vitamin D deficiency     Past Surgical History:  Procedure Laterality Date   BRAIN SURGERY     COLONOSCOPY     CYST EXCISION Right 06/14/2021   Procedure: EXCISION RETINACULAR CYST RIGHT MIDDLE FINGER;  Surgeon: Betha Loa, MD;  Location: [facility];  Service: Orthopedics;  Laterality: Right;  30 MIN   MOUTH SURGERY     TOE SURGERY Left    L big toe   TRIGGER FINGER RELEASE Right 06/14/2021   Procedure: A1 PULLEY RELEASE RIGHT MIDDLE FINGER;  Surgeon: Betha Loa, MD;  Location: [facility];  Service: Orthopedics;  Laterality: Right;    Social History   Socioeconomic History   Marital status: Married    Spouse name: Not on file   Number of children: Not on file   Years of education: Not on file   Highest education level: Some college, no degree  Occupational History   Not on file  Tobacco Use   Smoking status: Former   Smokeless tobacco: Never   Tobacco comments:    Quit > 15 years ago, smoked 1-2 ppd for 30 years  Vaping Use   Vaping Use: Never used  Substance and Sexual Activity   Alcohol use: Yes    Comment: occasionally   Drug use: No   Sexual activity: Yes    Partners: Female    Birth control/protection: None  Other Topics Concern   Not on file  Social History Narrative   Not on  file   Social Determinants of Health   Financial Resource Strain: Low Risk  (04/29/2021)   Overall Financial Resource Strain (CARDIA)    Difficulty of Paying Living Expenses: Not hard at all  Food Insecurity: No Food Insecurity (10/08/2021)   Hunger Vital Sign    Worried About Running Out of Food in the Last Year: Never true    Ran Out of Food in the Last Year: Never true  Transportation Needs: No Transportation Needs (10/08/2021)   PRAPARE - Administrator, Civil Service (Medical): No    Lack of Transportation (Non-Medical): No  Physical Activity: Unknown (10/08/2021)   Exercise Vital Sign    Days of Exercise per Week: 0 days    Minutes of Exercise per Session: Not on file  Recent Concern: Physical Activity - Inactive (10/08/2021)   Exercise Vital Sign    Days of Exercise per Week: 0 days    Minutes of Exercise per Session: 0 min  Stress: Stress Concern Present (10/08/2021)   [employer] - Occupational Stress Questionnaire    Feeling of Stress : Very much  Social Connections: Moderately Integrated (10/08/2021)   Social Connection and Isolation Panel [NHANES]  Frequency of Communication with Friends and Family: Twice a week    Frequency of Social Gatherings with Friends and Family: Once a week    Attends Religious Services: 1 to 4 times per year    Active Member of [employer] or Organizations: No    Attends Engineer, structural: Not on file    Marital Status: Married  Catering manager Violence: Not At Risk (02/13/2020)   Humiliation, Afraid, Rape, and Kick questionnaire    Fear of Current or Ex-Partner: No    Emotionally Abused: No    Physically Abused: No    Sexually Abused: No    Family History  Problem Relation Age of Onset   Dementia Mother    Prostate cancer Father    Bladder Cancer Father    Diabetes Brother    Breast cancer Maternal Aunt    Esophageal cancer Neg Hx    Stomach cancer Neg Hx    Pancreatic cancer Neg Hx     Inflammatory bowel disease Neg Hx    Liver disease Neg Hx    Rectal cancer Neg Hx    Colon cancer Neg Hx     Current Outpatient Medications on File Prior to Visit  Medication Sig Dispense Refill   amphetamine-dextroamphetamine (ADDERALL) 20 MG tablet Take 1 tablet (20 mg total) by mouth 2 (two) times daily. 60 tablet 0   amphetamine-dextroamphetamine (ADDERALL) 20 MG tablet Take 1 tablet (20 mg total) by mouth 2 (two) times daily. 60 tablet 0   amphetamine-dextroamphetamine (ADDERALL) 20 MG tablet Take 1 tablet (20 mg total) by mouth 2 (two) times daily. 60 tablet 0   amphetamine-dextroamphetamine (ADDERALL) 20 MG tablet Take 1 tablet (20 mg total) by mouth 2 (two) times daily. 60 tablet 0   ARIPiprazole (ABILIFY) 5 MG tablet Take 1 tablet (5 mg total) by mouth at bedtime. 90 tablet 0   aspirin EC 81 MG tablet Take 1 tablet (81 mg total) by mouth daily. Swallow whole. 90 tablet 3   Cholecalciferol (VITAMIN D) 2000 units CAPS Take 2,000 Units by mouth daily.     escitalopram (LEXAPRO) 20 MG tablet Take 1 tablet by mouth once daily 90 tablet 1   latanoprost (XALATAN) 0.005 % ophthalmic solution INSTILL 1 DROP INTO EACH EYE AT BEDTIME     Omega-3 Fatty Acids (FISH OIL) 1200 MG CAPS Take 1 capsule by mouth daily at 12 noon.     rosuvastatin (CRESTOR) 10 MG tablet Take 1 tablet (10 mg total) by mouth 2 (two) times a week. 30 tablet 3   tadalafil (CIALIS) 5 MG tablet 1-3 tabs po daily as needed. 30 tablet 2   tamsulosin (FLOMAX) 0.4 MG CAPS capsule Take 1 capsule (0.4 mg total) by mouth daily. 30 capsule 3   No current facility-administered medications on file prior to visit.    Allergies  Allergen Reactions   Wellbutrin [Bupropion] Other (See Comments)    Severe headache.   Tetanus Toxoids     "old horse serum preparation"       Physical Exam There were no vitals filed for this visit. Estimated body mass index is 28.65 kg/m as calculated from the following:   Height as of 02/07/22: 5'  9" (1.753 m).   Weight as of 04/09/22: 194 lb (88 kg).  EKG (optional): deferred due to virtual visit  GENERAL: alert, oriented, no acute distress detected; full vision exam deferred due to pandemic and/or virtual encounter   HEENT: atraumatic, conjunttiva clear, no obvious abnormalities on  inspection of external nose and ears  NECK: normal movements of the head and neck  LUNGS: on inspection no signs of respiratory distress, breathing rate appears normal, no obvious gross SOB, gasping or wheezing  CV: no obvious cyanosis  MS: moves all visible extremities without noticeable abnormality  PSYCH/NEURO: pleasant and cooperative, no obvious depression or anxiety, speech and thought processing grossly intact, Cognitive function grossly intact  Flowsheet Row Office Visit from 05/06/2022 in [facility]  PHQ-9 Total Score 9      Seeing psychiatrist and psychologist     05/06/2022   11:39 AM 04/09/2022   10:12 AM 07/11/2021    3:39 PM 04/29/2021    8:51 AM 04/18/2021   10:45 AM  Depression screen PHQ 2/9  Decreased Interest 1  0 0 1  Down, Depressed, Hopeless 0  1 0 0  PHQ - 2 Score 1  1 0 1  Altered sleeping 3    1  Tired, decreased energy 3  2  1   Change in appetite 0  0  0  Feeling bad or failure about yourself  0  0  0  Trouble concentrating 1  0  1  Moving slowly or fidgety/restless 1  0  0  Suicidal thoughts 0  0  0  PHQ-9 Score 9    4  Difficult doing work/chores   Not difficult at all  Not difficult at all     Information is confidential and restricted. Go to Review Flowsheets to unlock data.       04/29/2021    8:51 AM 07/11/2021    3:39 PM 10/08/2021    5:44 AM 02/07/2022    9:38 AM 05/06/2022   11:39 AM  Fall Risk  Falls in the past year? 1 1 1  0 0  Was there an injury with Fall? 0 0 0 0 0  Fall Risk Category Calculator 2 1 1   1  0 0  Fall Risk Category (Retired) Moderate Low Low   Low    (RETIRED) Patient Fall Risk Level Low fall risk  Low fall risk     Patient at Risk for Falls Due to Medication side effect Impaired balance/gait  No Fall Risks No Fall Risks  Fall risk Follow up Falls evaluation completed;Education provided;Falls prevention discussed Falls evaluation completed  Falls evaluation completed Falls evaluation completed     SUMMARY AND PLAN:  Encounter for Medicare annual wellness exam   Discussed applicable health maintenance/preventive health measures and advised and referred or ordered per patient preferences: -discussed vaccines due, recommendations and options -discussed prostate cancer screening risks/benefits and he is seeing PCP in a few days and will consider  Health Maintenance  Topic Date Due   Zoster Vaccines- Shingrix (1 of 2) 11/05/2022 (Originally 07/22/1974)   COVID-19 Vaccine (5 - 2023-24 season) 05/06/2023 (Originally 09/06/2021)   INFLUENZA VACCINE  08/07/2022   Medicare Annual Wellness (AWV)  05/06/2023   DTaP/Tdap/Td (3 - Td or Tdap) 12/06/2025   COLONOSCOPY (Pts 45-32yrs Insurance coverage will need to be confirmed)  06/05/2026   Pneumonia Vaccine 79+ Years old  Completed   Hepatitis C Screening  Completed   HPV VACCINES  Aged [employer] and counseling on the following was provided based on the above review of health and a plan/checklist for the patient, along with additional information discussed, was provided for the patient in the patient instructions :   -Provided counseling and plan for difficulty hearing - reports  had evaluation in the past -Provided safe balance exercises that can be done at home to improve balance and discussed exercise guidelines for adults with include balance exercises at least 3 days per week.  -Advised and counseled on a healthy lifestyle - including the importance of a healthy diet, regular physical activity, -provided info on social connections and stress management. -Reviewed patient's current diet. Advised and counseled on a whole foods based  healthy diet. A summary of a healthy diet was provided in the Patient Instructions. Advised to increase fresh/unprocessed or minimally processed fruits and veggies.  -reviewed patient's current physical activity level and discussed exercise guidelines for adults. Discussed community resources and ideas for safe exercise at home to assist in meeting exercise guideline recommendations in a safe and healthy way. Having eval with his doc for chronic fatigue in a few days. Advised if workup ok to consider gradually adding exercise.  -Advise yearly dental visits at minimum and regular eye exams -Advised and counseled on MJ risks  Follow up: see patient instructions   Patient Instructions  I really enjoyed getting to talk with you today! I am available on Tuesdays and Thursdays for virtual visits if you have any questions or concerns, or if I can be of any further assistance.   CHECKLIST FROM ANNUAL WELLNESS VISIT:  -Follow up (please call to schedule if not scheduled after visit):   - see Dr. Salomon Fick as scheduled   -yearly for annual wellness visit with primary care office  Here is a list of your preventive care/health maintenance measures and the plan for each if any are due:  PLAN For any measures below that may be due:   Health Maintenance  Topic Date Due   Zoster Vaccines- Shingrix (1 of 2) 11/05/2022 (Originally 07/22/1974)   COVID-19 Vaccine (5 - 2023-24 season) 05/06/2023 (Originally 09/06/2021)   INFLUENZA VACCINE  08/07/2022   Medicare Annual Wellness (AWV)  05/06/2023   DTaP/Tdap/Td (3 - Td or Tdap) 12/06/2025   COLONOSCOPY (Pts 45-48yrs Insurance coverage will need to be confirmed)  06/05/2026   Pneumonia Vaccine 66+ Years old  Completed   Hepatitis C Screening  Completed   HPV VACCINES  Aged Out    -See a dentist at least yearly  -Get your eyes checked and then per your eye specialist's recommendations  -Other issues addressed today:   -I have included below further  information regarding a healthy whole foods based diet, physical activity guidelines for adults, stress management and opportunities for social connections. I hope you find this information useful.   -----------------------------------------------------------------------------------------------------------------------------------------------------------------------------------------------------------------------------------------------------------  NUTRITION: -eat real food: lots of colorful vegetables (half the plate) and fruits -5-7 servings of vegetables and fruits per day (fresh or steamed is best), exp. 2 servings of vegetables with lunch and dinner and 2 servings of fruit per day. Berries and greens such as kale and collards are great choices.  -consume on a regular basis: whole grains (make sure first ingredient on label contains the word "whole"), fresh fruits, fish, nuts, seeds, healthy oils (such as olive oil, avocado oil, grape seed oil) -may eat small amounts of dairy and lean meat on occasion, but avoid processed meats such as ham, bacon, lunch meat, etc. -drink water -try to avoid fast food and pre-packaged foods, processed meat -most experts advise limiting sodium to < 2300mg  per day, should limit further is any chronic conditions such as high blood pressure, heart disease, diabetes, etc. The American Heart Association advised that < 1500mg  is is ideal -try  to avoid foods that contain any ingredients with names you do not recognize  -try to avoid sugar/sweets (except for the natural sugar that occurs in fresh fruit) -try to avoid sweet drinks -try to avoid white rice, white bread, pasta (unless whole grain), white or yellow potatoes  EXERCISE GUIDELINES FOR ADULTS: -if you wish to increase your physical activity, do so gradually and with the approval of your doctor -STOP and seek medical care immediately if you have any chest pain, chest discomfort or trouble breathing when starting  or increasing exercise  -move and stretch your body, legs, feet and arms when sitting for long periods -Physical activity guidelines for optimal health in adults: -least 150 minutes per week of aerobic exercise (can talk, but not sing) once approved by your doctor, 20-30 minutes of sustained activity or two 10 minute episodes of sustained activity every day.  -resistance training at least 2 days per week if approved by your doctor -balance exercises 3+ days per week:   Stand somewhere where you have something sturdy to hold onto if you lose balance.    1) lift up on toes, start with 5x per day and work up to 20x   2) stand and lift on leg straight out to the side so that foot is a few inches of the floor, start with 5x each side and work up to 20x each side   3) stand on one foot, start with 5 seconds each side and work up to 20 seconds on each side  If you need ideas or help with getting more active:  -Silver sneakers https://tools.silversneakers.com  -Walk with a Doc: http://www.duncan-williams.com/  -try to include resistance (weight lifting/strength building) and balance exercises twice per week: or the following link for ideas: http://castillo-powell.com/  BuyDucts.dk  STRESS MANAGEMENT: -can try meditating, or just sitting quietly with deep breathing while intentionally relaxing all parts of your body for 5 minutes daily -if you need further help with stress, anxiety or depression please follow up with your primary doctor or contact the wonderful folks at [facility]: [phone]  SOCIAL CONNECTIONS: -options in [place] if you wish to engage in more social and exercise related activities:  -Silver sneakers https://tools.silversneakers.com  -Walk with a Doc: http://www.duncan-williams.com/  -Check out the [facility] Active Adults 50+ section on the [place] [employer] (hiking  clubs, book clubs, cards and games, chess, exercise classes, aquatic classes and much more) - see the website for details: https://www.[place].gov/departments/parks-recreation/active-adults50  -YouTube has lots of exercise videos for different ages and abilities as well  -[facility] (a variety of indoor and outdoor inperson activities for adults). [phone]. [address].  -Virtual Online Classes (a variety of topics): see seniorplanet.org or call ([phone]  -consider volunteering at a school, hospice center, church, senior center or elsewhere           Terressa Koyanagi, DO

## 2022-05-06 NOTE — Patient Instructions (Signed)
I really enjoyed getting to talk with you today! I am available on Tuesdays and Thursdays for virtual visits if you have any questions or concerns, or if I can be of any further assistance.   CHECKLIST FROM ANNUAL WELLNESS VISIT:  -Follow up (please call to schedule if not scheduled after visit):   - see Dr. Salomon Fick as scheduled   -yearly for annual wellness visit with primary care office  Here is a list of your preventive care/health maintenance measures and the plan for each if any are due:  PLAN For any measures below that may be due:   Health Maintenance  Topic Date Due   Zoster Vaccines- Shingrix (1 of 2) 11/05/2022 (Originally 07/22/1974)   COVID-19 Vaccine (5 - 2023-24 season) 05/06/2023 (Originally 09/06/2021)   INFLUENZA VACCINE  08/07/2022   Medicare Annual Wellness (AWV)  05/06/2023   DTaP/Tdap/Td (3 - Td or Tdap) 12/06/2025   COLONOSCOPY (Pts 45-39yrs Insurance coverage will need to be confirmed)  06/05/2026   Pneumonia Vaccine 46+ Years old  Completed   Hepatitis C Screening  Completed   HPV VACCINES  Aged Out    -See a dentist at least yearly  -Get your eyes checked and then per your eye specialist's recommendations  -Other issues addressed today:   -I have included below further information regarding a healthy whole foods based diet, physical activity guidelines for adults, stress management and opportunities for social connections. I hope you find this information useful.   -----------------------------------------------------------------------------------------------------------------------------------------------------------------------------------------------------------------------------------------------------------  NUTRITION: -eat real food: lots of colorful vegetables (half the plate) and fruits -5-7 servings of vegetables and fruits per day (fresh or steamed is best), exp. 2 servings of vegetables with lunch and dinner and 2 servings of fruit per day.  Berries and greens such as kale and collards are great choices.  -consume on a regular basis: whole grains (make sure first ingredient on label contains the word "whole"), fresh fruits, fish, nuts, seeds, healthy oils (such as olive oil, avocado oil, grape seed oil) -may eat small amounts of dairy and lean meat on occasion, but avoid processed meats such as ham, bacon, lunch meat, etc. -drink water -try to avoid fast food and pre-packaged foods, processed meat -most experts advise limiting sodium to < 2300mg  per day, should limit further is any chronic conditions such as high blood pressure, heart disease, diabetes, etc. The American Heart Association advised that < 1500mg  is is ideal -try to avoid foods that contain any ingredients with names you do not recognize  -try to avoid sugar/sweets (except for the natural sugar that occurs in fresh fruit) -try to avoid sweet drinks -try to avoid white rice, white bread, pasta (unless whole grain), white or yellow potatoes  EXERCISE GUIDELINES FOR ADULTS: -if you wish to increase your physical activity, do so gradually and with the approval of your doctor -STOP and seek medical care immediately if you have any chest pain, chest discomfort or trouble breathing when starting or increasing exercise  -move and stretch your body, legs, feet and arms when sitting for long periods -Physical activity guidelines for optimal health in adults: -least 150 minutes per week of aerobic exercise (can talk, but not sing) once approved by your doctor, 20-30 minutes of sustained activity or two 10 minute episodes of sustained activity every day.  -resistance training at least 2 days per week if approved by your doctor -balance exercises 3+ days per week:   Stand somewhere where you have something sturdy to hold onto if you  lose balance.    1) lift up on toes, start with 5x per day and work up to 20x   2) stand and lift on leg straight out to the side so that foot is a few  inches of the floor, start with 5x each side and work up to 20x each side   3) stand on one foot, start with 5 seconds each side and work up to 20 seconds on each side  If you need ideas or help with getting more active:  -Silver sneakers https://tools.silversneakers.com  -Walk with a Doc: http://stephens-thompson.biz/  -try to include resistance (weight lifting/strength building) and balance exercises twice per week: or the following link for ideas: ChessContest.fr  UpdateClothing.com.cy  STRESS MANAGEMENT: -can try meditating, or just sitting quietly with deep breathing while intentionally relaxing all parts of your body for 5 minutes daily -if you need further help with stress, anxiety or depression please follow up with your primary doctor or contact the wonderful folks at [employer]: [place]: -options in [place] if you wish to engage in more social and exercise related activities:  -Silver sneakers https://tools.silversneakers.com  -Walk with a Doc: http://stephens-thompson.biz/  -Check out the [place] 50+ section on the [employer] (hiking clubs, book clubs, cards and games, chess, exercise classes, aquatic classes and much more) - see the website for details: https://www.[place]-.gov/departments/parks-recreation/active-adults50  -YouTube has lots of exercise videos for different ages and abilities as well  -[facility] (a variety of indoor and outdoor inperson activities for adults). (516)574-4477. [address].  -Virtual Online Classes (a variety of topics): see seniorplanet.org or call 828-596-9949  -consider volunteering at a school, hospice center, church, senior center or elsewhere

## 2022-05-07 ENCOUNTER — Telehealth (HOSPITAL_COMMUNITY): Payer: Medicare Other | Admitting: Psychiatry

## 2022-05-08 ENCOUNTER — Ambulatory Visit (INDEPENDENT_AMBULATORY_CARE_PROVIDER_SITE_OTHER): Payer: Medicare Other | Admitting: Family Medicine

## 2022-05-08 ENCOUNTER — Encounter: Payer: Self-pay | Admitting: Family Medicine

## 2022-05-08 VITALS — BP 124/72 | HR 73 | Temp 97.9°F | Wt 186.4 lb

## 2022-05-08 DIAGNOSIS — N401 Enlarged prostate with lower urinary tract symptoms: Secondary | ICD-10-CM | POA: Diagnosis not present

## 2022-05-08 DIAGNOSIS — N529 Male erectile dysfunction, unspecified: Secondary | ICD-10-CM

## 2022-05-08 DIAGNOSIS — R351 Nocturia: Secondary | ICD-10-CM | POA: Diagnosis not present

## 2022-05-08 DIAGNOSIS — R5383 Other fatigue: Secondary | ICD-10-CM

## 2022-05-08 DIAGNOSIS — J342 Deviated nasal septum: Secondary | ICD-10-CM

## 2022-05-08 DIAGNOSIS — F3181 Bipolar II disorder: Secondary | ICD-10-CM

## 2022-05-08 DIAGNOSIS — E782 Mixed hyperlipidemia: Secondary | ICD-10-CM | POA: Diagnosis not present

## 2022-05-08 DIAGNOSIS — F902 Attention-deficit hyperactivity disorder, combined type: Secondary | ICD-10-CM

## 2022-05-08 DIAGNOSIS — R0683 Snoring: Secondary | ICD-10-CM | POA: Diagnosis not present

## 2022-05-08 DIAGNOSIS — E559 Vitamin D deficiency, unspecified: Secondary | ICD-10-CM

## 2022-05-08 DIAGNOSIS — R0981 Nasal congestion: Secondary | ICD-10-CM

## 2022-05-08 LAB — VITAMIN B12: Vitamin B-12: 271 pg/mL (ref 211–911)

## 2022-05-08 LAB — COMPREHENSIVE METABOLIC PANEL
ALT: 17 U/L (ref 0–53)
AST: 21 U/L (ref 0–37)
Albumin: 4.4 g/dL (ref 3.5–5.2)
Alkaline Phosphatase: 75 U/L (ref 39–117)
BUN: 11 mg/dL (ref 6–23)
CO2: 26 mEq/L (ref 19–32)
Calcium: 9.9 mg/dL (ref 8.4–10.5)
Chloride: 102 mEq/L (ref 96–112)
Creatinine, Ser: 1.06 mg/dL (ref 0.40–1.50)
GFR: 71.97 mL/min (ref >60.00)
Glucose, Bld: 87 mg/dL (ref 70–99)
Potassium: 4.3 mEq/L (ref 3.5–5.1)
Sodium: 138 mEq/L (ref 135–145)
Total Bilirubin: 0.4 mg/dL (ref 0.2–1.2)
Total Protein: 7.5 g/dL (ref 6.0–8.3)

## 2022-05-08 LAB — CBC WITH DIFFERENTIAL/PLATELET
Basophils Absolute: 0.1 10*3/uL (ref 0.0–0.1)
Basophils Relative: 1.3 % (ref 0.0–3.0)
Eosinophils Absolute: 0.1 10*3/uL (ref 0.0–0.7)
Eosinophils Relative: 1.6 % (ref 0.0–5.0)
HCT: 46.1 % (ref 39.0–52.0)
Hemoglobin: 15.5 g/dL (ref 13.0–17.0)
Lymphocytes Relative: 23 % (ref 12.0–46.0)
Lymphs Abs: 2 10*3/uL (ref 0.7–4.0)
MCHC: 33.7 g/dL (ref 30.0–36.0)
MCV: 91.3 fl (ref 78.0–100.0)
Monocytes Absolute: 0.6 10*3/uL (ref 0.1–1.0)
Monocytes Relative: 6.5 % (ref 3.0–12.0)
Neutro Abs: 5.9 10*3/uL (ref 1.4–7.7)
Neutrophils Relative %: 67.6 % (ref 43.0–77.0)
Platelets: 260 10*3/uL (ref 150.0–400.0)
RBC: 5.05 Mil/uL (ref 4.22–5.81)
RDW: 14.3 % (ref 11.5–15.5)
WBC: 8.7 10*3/uL (ref 4.0–10.5)

## 2022-05-08 LAB — LIPID PANEL
Cholesterol: 167 mg/dL (ref 0–200)
HDL: 44.9 mg/dL (ref >39.00)
LDL Cholesterol: 97 mg/dL (ref 0–99)
NonHDL: 122.18
Total CHOL/HDL Ratio: 4
Triglycerides: 125 mg/dL (ref 0.0–149.0)
VLDL: 25 mg/dL (ref 0.0–40.0)

## 2022-05-08 LAB — T4, FREE: Free T4: 1.1 ng/dL (ref 0.60–1.60)

## 2022-05-08 LAB — VITAMIN D 25 HYDROXY (VIT D DEFICIENCY, FRACTURES): VITD: 36.53 ng/mL (ref 30.00–100.00)

## 2022-05-08 LAB — TSH: TSH: 1.43 u[IU]/mL (ref 0.35–5.50)

## 2022-05-08 MED ORDER — TADALAFIL 5 MG PO TABS: ORAL_TABLET | ORAL | 5 refills | Status: DC

## 2022-05-08 MED ORDER — ROSUVASTATIN CALCIUM 10 MG PO TABS: 10.0000 mg | ORAL_TABLET | ORAL | 3 refills | Status: DC

## 2022-05-08 MED ORDER — TAMSULOSIN HCL 0.4 MG PO CAPS: 0.4000 mg | ORAL_CAPSULE | Freq: Every day | ORAL | 3 refills | Status: DC

## 2022-05-08 MED ORDER — AMPHETAMINE-DEXTROAMPHETAMINE 20 MG PO TABS: 20.0000 mg | ORAL_TABLET | Freq: Two times a day (BID) | ORAL | 0 refills | Status: DC

## 2022-05-08 MED ORDER — ESCITALOPRAM OXALATE 20 MG PO TABS: ORAL_TABLET | ORAL | 1 refills | Status: DC

## 2022-05-08 NOTE — Progress Notes (Signed)
Established Patient Office Visit   Subjective  Patient ID: Michael Durham, male    DOB: [birth year]  Age: 67 y.o. MRN: [id number]  Chief Complaint  Patient presents with   Medication Refill    Refill, would like to get labs done. Psychiatrist would like to know what is making him tired      Patient is a 67 year old male with pmh sig for ADHD, chronic nasal congestion, HLD, bipolar disorder, ED, BPH who comes in for follow-up.  Patient was seen by his psychiatrist who suggested he follow-up with PCP for labs due to continued fatigue.  Patient endorses waking up feeling tired, taking a nap in the afternoon, and snoring.  Patient has a history of deviated septum.  Unfortunately missed recent ENT appointment.  Also notes difficulty contacting ENT and having to wait over 50 minutes in person and on the phone.  Patient endorses inability to breathe out of nose, right side of nose always being congested/blocked.  In the past patient endorses history of vitamin D deficiency.  Walking/getting some exercise.  Needs refills.  States doing well otherwise.  Has an upcoming trip to the beach with family.  Medication Refill      ROS Negative unless stated above    Objective:     BP 124/72 (BP Location: Left Arm, Patient Position: Sitting, Cuff Size: Normal)   Pulse 73   Temp 97.9 F (36.6 C) (Oral)   Wt 186 lb 6.4 oz (84.6 kg)   SpO2 98%   BMI 27.53 kg/m    Physical Exam Constitutional:      General: He is not in acute distress.    Appearance: Normal appearance.  HENT:     Head: Normocephalic and atraumatic.     Nose: Septal deviation and congestion present.     Right Turbinates: Enlarged.     Mouth/Throat:     Mouth: Mucous membranes are moist.  Eyes:     Extraocular Movements: Extraocular movements intact.     Conjunctiva/sclera: Conjunctivae normal.     Comments: Wearing glasses.  Cardiovascular:     Rate and Rhythm: Normal rate and regular rhythm.     Heart  sounds: Normal heart sounds. No murmur heard.    No gallop.  Pulmonary:     Effort: Pulmonary effort is normal. No respiratory distress.     Breath sounds: Normal breath sounds. No wheezing, rhonchi or rales.  Skin:    General: Skin is warm and dry.  Neurological:     Mental Status: He is alert and oriented to person, place, and time.      No results found for any visits on 05/08/22.    Assessment & Plan:  Deviated septum -New referral placed for ENT  -     Ambulatory referral to ENT  Attention deficit hyperactivity disorder (ADHD), combined type -Stable -Continue twice daily -PMD P reviewed -     Amphetamine-Dextroamphetamine; Take 1 tablet (20 mg total) by mouth 2 (two) times daily.  Dispense: 60 tablet; Refill: 0 -     Amphetamine-Dextroamphetamine; Take 1 tablet (20 mg total) by mouth 2 (two) times daily.  Dispense: 60 tablet; Refill: 0 -     Amphetamine-Dextroamphetamine; Take 1 tablet (20 mg total) by mouth 2 (two) times daily.  Dispense: 60 tablet; Refill: 0  Mixed hyperlipidemia -     Rosuvastatin Calcium; Take 1 tablet (10 mg total) by mouth 2 (two) times a week.  Dispense: 90 tablet; Refill: 3 -  Lipid panel -     Comprehensive metabolic panel  Erectile dysfunction, unspecified erectile dysfunction type -     Tadalafil; 1-3 tabs po daily as needed.  Dispense: 30 tablet; Refill: 5  Benign prostatic hyperplasia with nocturia -     Tamsulosin HCl; Take 1 capsule (0.4 mg total) by mouth daily.  Dispense: 90 capsule; Refill: 3 -     CBC with Differential/Platelet  Chronic nasal congestion -     Ambulatory referral to ENT  Snoring -Likely multifactorial including history of deviated septum.  Also concern for OSA. -Will see ENT.  If sleep study then needed will place referral. -     CBC with Differential/Platelet -     Ambulatory referral to ENT  Fatigue, unspecified type -Likely multifactorial including possible OSA, vitamin deficiency, electrolyte  abnormality, medications -     Vitamin B12 -     VITAMIN D 25 Hydroxy (Vit-D Deficiency, Fractures) -     TSH -     T4, free -     CBC with Differential/Platelet -     Comprehensive metabolic panel  Bipolar 2 disorder (HCC) -Stable -Continue follow-up with psychiatry -Continue Lexapro, Abilify -     Escitalopram Oxalate; Take 1 tablet by mouth once daily  Dispense: 90 tablet; Refill: 1  Vitamin D deficiency -May be contributing to fatigue -     VITAMIN D 25 Hydroxy (Vit-D Deficiency, Fractures)   Return in about 3 months (around 08/08/2022).   Deeann Saint, MD

## 2022-05-09 ENCOUNTER — Telehealth (HOSPITAL_COMMUNITY): Payer: Medicare Other | Admitting: Psychiatry

## 2022-05-14 NOTE — Telephone Encounter (Signed)
Referral sent at recent [facility].

## 2022-05-20 ENCOUNTER — Other Ambulatory Visit: Payer: Self-pay | Admitting: Family Medicine

## 2022-05-20 ENCOUNTER — Encounter: Payer: Self-pay | Admitting: Family Medicine

## 2022-05-20 ENCOUNTER — Telehealth (HOSPITAL_COMMUNITY): Payer: Self-pay | Admitting: *Deleted

## 2022-05-20 ENCOUNTER — Telehealth (HOSPITAL_BASED_OUTPATIENT_CLINIC_OR_DEPARTMENT_OTHER): Payer: Medicare Other | Admitting: Psychiatry

## 2022-05-20 ENCOUNTER — Encounter (HOSPITAL_COMMUNITY): Payer: Self-pay | Admitting: Psychiatry

## 2022-05-20 VITALS — Wt 186.0 lb

## 2022-05-20 DIAGNOSIS — F331 Major depressive disorder, recurrent, moderate: Secondary | ICD-10-CM | POA: Diagnosis not present

## 2022-05-20 DIAGNOSIS — F900 Attention-deficit hyperactivity disorder, predominantly inattentive type: Secondary | ICD-10-CM

## 2022-05-20 DIAGNOSIS — F3181 Bipolar II disorder: Secondary | ICD-10-CM

## 2022-05-20 MED ORDER — VORTIOXETINE HBR 5 MG PO TABS
ORAL_TABLET | ORAL | 0 refills | Status: DC
Start: 2022-05-20 — End: 2022-06-03

## 2022-05-20 NOTE — Progress Notes (Signed)
[facility] MD Virtual Progress Note   Patient Location: Home Provider Location: Home Office  I connect with patient by video and verified that I am speaking with correct person by using two identifiers. I discussed the limitations of evaluation and management by telemedicine and the availability of in person appointments. I also discussed with the patient that there may be a patient responsible charge related to this service. The patient expressed understanding and agreed to proceed.  Michael Durham [id number] 67 y.o.  05/20/2022 11:06 AM  History of Present Illness:  Patient is 67 year old married, retired man who is referred from primary care physician for the management of his depression and anxiety symptoms.  Patient is evaluated by video session.  He is taking Adderall, Abilify and Lexapro.  He still struggle with fatigue, lack of motivation, lack of energy with a lot of ruminative thoughts.  He has a lot of guilt about his past.  We have recommended to have his blood work to rule out any cause for fatigue.  He has blood work and his labs are normal.  He denies any feeling of hopelessness, worthlessness or suicidal thoughts but reported irritability, frustration, lack of desire to do things.  He thinks about his daughter and his previous marriage.  He regrets not having communication with her 69 year old daughter who lives in [place].  He admitted that things will not change related to his daughter but is hoping 1 day he may able to communicate directly with her.  Patient lives with his wife and this is his third marriage.  He is happy in his current marriage.  He denies any mania, psychosis.  He denies any irritability, highs and lows and he reported his mood symptoms are not as bad.  Denies any impulsive buying, excessive spending.  However he noted his energy level is low and his sleep is okay.  We have recommended therapy but patient did not schedule appointment.  He  understands the need to see a therapist to get better skills to deal with his guilt.  He also likely try a different medication since he feels the current medicine is not helping as much and he is in a status quo.  Past Psychiatric History: Family h/o depression schizophrenia. No history of suicidal attempt, psychosis. Given the diagnosis of bipolar disorder for highs and lows and getting easily upset and irritable. Abilify helped. Diagnosed ADHD in 2008 after career counselor recommended to do testing as struggling with focus attention concentration. Tried Wellbutrin twice but had HA. No h/o drug use, legal issues. Saw NP Julius Bowels in [place] until she left the practice.    Outpatient Encounter Medications as of 05/20/2022  Medication Sig   amphetamine-dextroamphetamine (ADDERALL) 20 MG tablet Take 1 tablet (20 mg total) by mouth 2 (two) times daily.   amphetamine-dextroamphetamine (ADDERALL) 20 MG tablet Take 1 tablet (20 mg total) by mouth 2 (two) times daily.   amphetamine-dextroamphetamine (ADDERALL) 20 MG tablet Take 1 tablet (20 mg total) by mouth 2 (two) times daily.   amphetamine-dextroamphetamine (ADDERALL) 20 MG tablet Take 1 tablet (20 mg total) by mouth 2 (two) times daily.   ARIPiprazole (ABILIFY) 5 MG tablet TAKE 1 TABLET BY MOUTH AT BEDTIME   aspirin EC 81 MG tablet Take 1 tablet (81 mg total) by mouth daily. Swallow whole.   Cholecalciferol (VITAMIN D) 2000 units CAPS Take 2,000 Units by mouth daily.   escitalopram (LEXAPRO) 20 MG tablet Take 1 tablet by mouth once daily  latanoprost (XALATAN) 0.005 % ophthalmic solution INSTILL 1 DROP INTO EACH EYE AT BEDTIME   Omega-3 Fatty Acids (FISH OIL) 1200 MG CAPS Take 1 capsule by mouth daily at 12 noon.   rosuvastatin (CRESTOR) 10 MG tablet Take 1 tablet (10 mg total) by mouth 2 (two) times a week.   tadalafil (CIALIS) 5 MG tablet 1-3 tabs po daily as needed.   tamsulosin (FLOMAX) 0.4 MG CAPS capsule Take 1 capsule (0.4 mg  total) by mouth daily.   No facility-administered encounter medications on file as of 05/20/2022.    Recent Results (from the past 2160 hour(s))  Vitamin B12     Status: None   Collection Time: 05/08/22 10:17 AM  Result Value Ref Range   Vitamin B-12 271 211 - 911 pg/mL  Vitamin D, 25-hydroxy     Status: None   Collection Time: 05/08/22 10:17 AM  Result Value Ref Range   VITD 36.53 30.00 - 100.00 ng/mL  TSH     Status: None   Collection Time: 05/08/22 10:17 AM  Result Value Ref Range   TSH 1.43 0.35 - 5.50 uIU/mL  T4, Free     Status: None   Collection Time: 05/08/22 10:17 AM  Result Value Ref Range   Free T4 1.10 0.60 - 1.60 ng/dL    Comment: Specimens from patients who are undergoing biotin therapy and /or ingesting biotin supplements may contain high levels of biotin.  The higher biotin concentration in these specimens interferes with this Free T4 assay.  Specimens that contain high levels  of biotin may cause false high results for this Free T4 assay.  Please interpret results in light of the total clinical presentation of the patient.    Lipid panel     Status: None   Collection Time: 05/08/22 10:17 AM  Result Value Ref Range   Cholesterol 167 0 - 200 mg/dL    Comment: ATP III Classification       Desirable:  < 200 mg/dL               Borderline High:  200 - 239 mg/dL          High:  > = 161 mg/dL   Triglycerides 096.0 0.0 - 149.0 mg/dL    Comment: Normal:  <454 mg/dLBorderline High:  150 - 199 mg/dL   HDL 09.81 >19.14 mg/dL   VLDL 78.2 0.0 - 95.6 mg/dL   LDL Cholesterol 97 0 - 99 mg/dL   Total CHOL/HDL Ratio 4     Comment:                Men          Women1/2 Average Risk     3.4          3.3Average Risk          5.0          4.42X Average Risk          9.6          7.13X Average Risk          15.0          11.0                       NonHDL 122.18     Comment: NOTE:  Non-HDL goal should be 30 mg/dL higher than patient's LDL goal (i.e. LDL goal of < 70 mg/dL, would have  non-HDL goal of < 100 mg/dL)  CBC with Differential/Platelet     Status: None   Collection Time: 05/08/22 10:17 AM  Result Value Ref Range   WBC 8.7 4.0 - 10.5 K/uL   RBC 5.05 4.22 - 5.81 Mil/uL   Hemoglobin 15.5 13.0 - 17.0 g/dL   HCT 60.4 54.0 - 98.1 %   MCV 91.3 78.0 - 100.0 fl   MCHC 33.7 30.0 - 36.0 g/dL   RDW 19.1 47.8 - 29.5 %   Platelets 260.0 150.0 - 400.0 K/uL   Neutrophils Relative % 67.6 43.0 - 77.0 %   Lymphocytes Relative 23.0 12.0 - 46.0 %   Monocytes Relative 6.5 3.0 - 12.0 %   Eosinophils Relative 1.6 0.0 - 5.0 %   Basophils Relative 1.3 0.0 - 3.0 %   Neutro Abs 5.9 1.4 - 7.7 K/uL   Lymphs Abs 2.0 0.7 - 4.0 K/uL   Monocytes Absolute 0.6 0.1 - 1.0 K/uL   Eosinophils Absolute 0.1 0.0 - 0.7 K/uL   Basophils Absolute 0.1 0.0 - 0.1 K/uL  CMP     Status: None   Collection Time: 05/08/22 10:17 AM  Result Value Ref Range   Sodium 138 135 - 145 mEq/L   Potassium 4.3 3.5 - 5.1 mEq/L   Chloride 102 96 - 112 mEq/L   CO2 26 19 - 32 mEq/L   Glucose, Bld 87 70 - 99 mg/dL   BUN 11 6 - 23 mg/dL   Creatinine, Ser 6.21 0.40 - 1.50 mg/dL   Total Bilirubin 0.4 0.2 - 1.2 mg/dL   Alkaline Phosphatase 75 39 - 117 U/L   AST 21 0 - 37 U/L   ALT 17 0 - 53 U/L   Total Protein 7.5 6.0 - 8.3 g/dL   Albumin 4.4 3.5 - 5.2 g/dL   GFR 30.86 >57.84 mL/min    Comment: Calculated using the CKD-EPI Creatinine Equation (2021)   Calcium 9.9 8.4 - 10.5 mg/dL     Psychiatric Specialty Exam: Physical Exam  Review of Systems  Constitutional:  Positive for appetite change.  Psychiatric/Behavioral:  Positive for dysphoric mood.     Weight 186 lb (84.4 kg).There is no height or weight on file to calculate BMI.  General Appearance: Casual  Eye Contact:  Good  Speech:  Slow  Volume:  Decreased  Mood:  Anxious and Dysphoric  Affect:  Congruent  Thought Process:  Descriptions of Associations: Intact  Orientation:  Full (Time, Place, and Person)  Thought Content:  Rumination  Suicidal  Thoughts:  No  Homicidal Thoughts:  No  Memory:  Immediate;   Good Recent;   Good Remote;   Fair  Judgement:  Intact  Insight:  Present  Psychomotor Activity:  Decreased  Concentration:  Concentration: Fair and Attention Span: Fair  Recall:  Good  Fund of Knowledge:  Good  Language:  Good  Akathisia:  No  Handed:  Right  AIMS (if indicated):     Assets:  Communication Skills Desire for Improvement Housing Social Support Transportation  ADL's:  Intact  Cognition:  WNL  Sleep:  ok     Assessment/Plan: Recurrent major depressive episodes, moderate (HCC) - Plan: vortioxetine HBr (TRINTELLIX) 5 MG TABS tablet  Attention deficit hyperactivity disorder (ADHD), predominantly inattentive type - Plan: vortioxetine HBr (TRINTELLIX) 5 MG TABS tablet  I reviewed blood work results, current medication.  Patient is open to try a different medication.  His labs are normal.  We will try Trintellix 5 mg daily for 1 week and then 10 mg  daily.  He will cut down his Lexapro from 20 mg to 10 mg for 1 week and then discontinue.  He will keep his Abilify and Adderall prescribed by PCP.  In the past he had tried Wellbutrin but causes severe headaches after 3 weeks and it was discontinued.  We will also provide therapy referrals.  I encourage to make appointment to help his coping skills.  Patient still have a lot of guilt and regret about his past.  Discussed safety concerns and any time having active suicidal thoughts or homicidal thought any need to call 911 or go to local emergency room.  Discussed medication side effects and benefits.  We will consider genetic testing if needed in the future.  Follow-up in 4 weeks.   Follow Up Instructions:     I discussed the assessment and treatment plan with the patient. The patient was provided an opportunity to ask questions and all were answered. The patient agreed with the plan and demonstrated an understanding of the instructions.   The patient was advised  to call back or seek an in-person evaluation if the symptoms worsen or if the condition fails to improve as anticipated.    Collaboration of Care: Other provider involved in patient's care AEB notes are available in epic to review.  Patient/Guardian was advised Release of Information must be obtained prior to any record release in order to collaborate their care with an outside provider. Patient/Guardian was advised if they have not already done so to contact the registration department to sign all necessary forms in order for [place] to release information regarding their care.   Consent: Patient/Guardian gives verbal consent for treatment and assignment of benefits for services provided during this visit. Patient/Guardian expressed understanding and agreed to proceed.     I provided 30 minutes of non face to face time during this encounter.  Note: This document was prepared by [employer] voice dictation technology and any errors that results from this process are unintentional.    Cleotis Nipper, MD 05/20/2022

## 2022-05-20 NOTE — Telephone Encounter (Signed)
Writer spoke with pt to advise that Trintellix 5 mg and 10 mg are available to be picked up. Pt was advised of office hours. Pt verbalizes understanding.

## 2022-06-03 ENCOUNTER — Other Ambulatory Visit (HOSPITAL_COMMUNITY): Payer: Self-pay | Admitting: *Deleted

## 2022-06-03 MED ORDER — VORTIOXETINE HBR 10 MG PO TABS: 10.0000 mg | ORAL_TABLET | Freq: Every day | ORAL | 1 refills | Status: DC

## 2022-07-21 ENCOUNTER — Encounter (HOSPITAL_COMMUNITY): Payer: Self-pay | Admitting: Psychiatry

## 2022-07-21 ENCOUNTER — Other Ambulatory Visit: Payer: Self-pay | Admitting: Family Medicine

## 2022-07-21 ENCOUNTER — Telehealth (HOSPITAL_COMMUNITY): Payer: Medicare Other | Admitting: Psychiatry

## 2022-07-21 VITALS — Wt 186.0 lb

## 2022-07-21 DIAGNOSIS — E782 Mixed hyperlipidemia: Secondary | ICD-10-CM

## 2022-07-21 DIAGNOSIS — F331 Major depressive disorder, recurrent, moderate: Secondary | ICD-10-CM | POA: Diagnosis not present

## 2022-07-21 DIAGNOSIS — F900 Attention-deficit hyperactivity disorder, predominantly inattentive type: Secondary | ICD-10-CM | POA: Diagnosis not present

## 2022-07-21 NOTE — Progress Notes (Signed)
[facility] MD Virtual Progress Note   Patient Location: Home Provider Location: Home Office  I connect with patient by video and verified that I am speaking with correct person by using two identifiers. I discussed the limitations of evaluation and management by telemedicine and the availability of in person appointments. I also discussed with the patient that there may be a patient responsible charge related to this service. The patient expressed understanding and agreed to proceed.  Michael Durham [id number] 67 y.o.  07/21/2022 3:07 PM  History of Present Illness:  Patient is evaluated by video session.  On the last visit we tried Trintellix and he liked but could not afford it was more than $400.  He is back on Lexapro and taking 10 mg.  He feels it is helping his anxiety and depression.  He is supposed to take 20 mg but 10 mg is working well as cardiogram had made him tired.  We have referred to see a therapist and patient is waiting to get this past appointment.  He sleeps on and off and his primary care doctor is working with him to get sleep study and also he may have deviated nasal septum.  He is taking Abilify to help his mood.  He denies any crying spells or any feeling of hopelessness or worthlessness.  He denies any excessive spending, buying or mood swings.  So far he is tolerating his medications which are prescribed by PCP.  He has no tremor or shakes or any EPS.  Patient still has regret about not having communication with her 23 year old daughter who lives in [place].  He is hoping once he sees a therapist he will discuss about his feelings.  He denies any suicidal thoughts.  Denies any panic attack.  He is taking Adderall prescribed by PCP to help his focus and ADHD symptoms.  Past Psychiatric History: Family h/o depression schizophrenia. No history of suicidal attempt, psychosis. Given the diagnosis of bipolar disorder for highs and lows and getting easily  upset and irritable. Abilify helped. Diagnosed ADHD in 2008 after career counselor recommended to do testing as struggling with focus attention concentration. Tried Wellbutrin twice but had HA. No h/o drug use, legal issues. Saw NP Julius Bowels in [place] until she left the practice.    Outpatient Encounter Medications as of 07/21/2022  Medication Sig   amphetamine-dextroamphetamine (ADDERALL) 20 MG tablet Take 1 tablet (20 mg total) by mouth 2 (two) times daily.   amphetamine-dextroamphetamine (ADDERALL) 20 MG tablet Take 1 tablet (20 mg total) by mouth 2 (two) times daily.   amphetamine-dextroamphetamine (ADDERALL) 20 MG tablet Take 1 tablet (20 mg total) by mouth 2 (two) times daily.   amphetamine-dextroamphetamine (ADDERALL) 20 MG tablet Take 1 tablet (20 mg total) by mouth 2 (two) times daily.   ARIPiprazole (ABILIFY) 5 MG tablet TAKE 1 TABLET BY MOUTH AT BEDTIME   aspirin EC 81 MG tablet Take 1 tablet (81 mg total) by mouth daily. Swallow whole.   Cholecalciferol (VITAMIN D) 2000 units CAPS Take 2,000 Units by mouth daily.   escitalopram (LEXAPRO) 20 MG tablet Take 1 tablet by mouth once daily (Patient taking differently: Take  1/2 tab  tablet for one week and than discontinued.)   latanoprost (XALATAN) 0.005 % ophthalmic solution INSTILL 1 DROP INTO EACH EYE AT BEDTIME   Omega-3 Fatty Acids (FISH OIL) 1200 MG CAPS Take 1 capsule by mouth daily at 12 noon.   rosuvastatin (CRESTOR) 10 MG tablet Take 1  tablet (10 mg total) by mouth 2 (two) times a week.   tadalafil (CIALIS) 5 MG tablet 1-3 tabs po daily as needed.   tamsulosin (FLOMAX) 0.4 MG CAPS capsule Take 1 capsule (0.4 mg total) by mouth daily.   vortioxetine HBr (TRINTELLIX) 10 MG TABS tablet Take 1 tablet (10 mg total) by mouth daily.   No facility-administered encounter medications on file as of 07/21/2022.    Recent Results (from the past 2160 hour(s))  Vitamin B12     Status: None   Collection Time: 05/08/22 10:17 AM   Result Value Ref Range   Vitamin B-12 271 211 - 911 pg/mL  Vitamin D, 25-hydroxy     Status: None   Collection Time: 05/08/22 10:17 AM  Result Value Ref Range   VITD 36.53 30.00 - 100.00 ng/mL  TSH     Status: None   Collection Time: 05/08/22 10:17 AM  Result Value Ref Range   TSH 1.43 0.35 - 5.50 uIU/mL  T4, Free     Status: None   Collection Time: 05/08/22 10:17 AM  Result Value Ref Range   Free T4 1.10 0.60 - 1.60 ng/dL    Comment: Specimens from patients who are undergoing biotin therapy and /or ingesting biotin supplements may contain high levels of biotin.  The higher biotin concentration in these specimens interferes with this Free T4 assay.  Specimens that contain high levels  of biotin may cause false high results for this Free T4 assay.  Please interpret results in light of the total clinical presentation of the patient.    Lipid panel     Status: None   Collection Time: 05/08/22 10:17 AM  Result Value Ref Range   Cholesterol 167 0 - 200 mg/dL    Comment: ATP III Classification       Desirable:  < 200 mg/dL               Borderline High:  200 - 239 mg/dL          High:  > = 657 mg/dL   Triglycerides 846.9 0.0 - 149.0 mg/dL    Comment: Normal:  <629 mg/dLBorderline High:  150 - 199 mg/dL   HDL 52.84 >13.24 mg/dL   VLDL 40.1 0.0 - 02.7 mg/dL   LDL Cholesterol 97 0 - 99 mg/dL   Total CHOL/HDL Ratio 4     Comment:                Men          Women1/2 Average Risk     3.4          3.3Average Risk          5.0          4.42X Average Risk          9.6          7.13X Average Risk          15.0          11.0                       NonHDL 122.18     Comment: NOTE:  Non-HDL goal should be 30 mg/dL higher than patient's LDL goal (i.e. LDL goal of < 70 mg/dL, would have non-HDL goal of < 100 mg/dL)  CBC with Differential/Platelet     Status: None   Collection Time: 05/08/22 10:17 AM  Result Value Ref Range   WBC  8.7 4.0 - 10.5 K/uL   RBC 5.05 4.22 - 5.81 Mil/uL   Hemoglobin 15.5  13.0 - 17.0 g/dL   HCT 16.1 09.6 - 04.5 %   MCV 91.3 78.0 - 100.0 fl   MCHC 33.7 30.0 - 36.0 g/dL   RDW 40.9 81.1 - 91.4 %   Platelets 260.0 150.0 - 400.0 K/uL   Neutrophils Relative % 67.6 43.0 - 77.0 %   Lymphocytes Relative 23.0 12.0 - 46.0 %   Monocytes Relative 6.5 3.0 - 12.0 %   Eosinophils Relative 1.6 0.0 - 5.0 %   Basophils Relative 1.3 0.0 - 3.0 %   Neutro Abs 5.9 1.4 - 7.7 K/uL   Lymphs Abs 2.0 0.7 - 4.0 K/uL   Monocytes Absolute 0.6 0.1 - 1.0 K/uL   Eosinophils Absolute 0.1 0.0 - 0.7 K/uL   Basophils Absolute 0.1 0.0 - 0.1 K/uL  CMP     Status: None   Collection Time: 05/08/22 10:17 AM  Result Value Ref Range   Sodium 138 135 - 145 mEq/L   Potassium 4.3 3.5 - 5.1 mEq/L   Chloride 102 96 - 112 mEq/L   CO2 26 19 - 32 mEq/L   Glucose, Bld 87 70 - 99 mg/dL   BUN 11 6 - 23 mg/dL   Creatinine, Ser 7.82 0.40 - 1.50 mg/dL   Total Bilirubin 0.4 0.2 - 1.2 mg/dL   Alkaline Phosphatase 75 39 - 117 U/L   AST 21 0 - 37 U/L   ALT 17 0 - 53 U/L   Total Protein 7.5 6.0 - 8.3 g/dL   Albumin 4.4 3.5 - 5.2 g/dL   GFR 95.62 >13.08 mL/min    Comment: Calculated using the CKD-EPI Creatinine Equation (2021)   Calcium 9.9 8.4 - 10.5 mg/dL     Psychiatric Specialty Exam: Physical Exam  Review of Systems  Weight 186 lb (84.4 kg).There is no height or weight on file to calculate BMI.  General Appearance: Casual  Eye Contact:  Good  Speech:  Slow  Volume:  Decreased  Mood:  Euthymic  Affect:  Appropriate  Thought Process:  Goal Directed  Orientation:  Full (Time, Place, and Person)  Thought Content:  Logical  Suicidal Thoughts:  No  Homicidal Thoughts:  No  Memory:  Immediate;   Good Recent;   Good Remote;   Fair  Judgement:  Intact  Insight:  Present  Psychomotor Activity:  Normal  Concentration:  Concentration: Good and Attention Span: Fair  Recall:  Good  Fund of Knowledge:  Good  Language:  Good  Akathisia:  No  Handed:  Right  AIMS (if indicated):     Assets:   Communication Skills Desire for Improvement Housing Social Support Transportation  ADL's:  Intact  Cognition:  WNL  Sleep:  fair     Assessment/Plan: Recurrent major depressive episodes, moderate (HCC)  Attention deficit hyperactivity disorder (ADHD), predominantly inattentive type  His depression is stable.  He liked the Trintellix but could not afford and currently is taking Lexapro 10 mg only which is not making him tired.  He wants to keep the current medicine Lexapro 10 mg, Abilify 5 mg daily and Adderall prescribed by PCP.  Like to keep his appointment with PCP who was also managing other chronic symptoms including sleep study.  I recommend if he need our services and if he feels the symptoms are worsening then he can call us to schedule appointment.  Patient agreed with the plan.  No new  follow-up scheduled at this time but patient will call us if need appointment in the future.   Follow Up Instructions:     I discussed the assessment and treatment plan with the patient. The patient was provided an opportunity to ask questions and all were answered. The patient agreed with the plan and demonstrated an understanding of the instructions.   The patient was advised to call back or seek an in-person evaluation if the symptoms worsen or if the condition fails to improve as anticipated.    Collaboration of Care: Other provider involved in patient's care AEB notes are available in epic to review.  Patient/Guardian was advised Release of Information must be obtained prior to any record release in order to collaborate their care with an outside provider. Patient/Guardian was advised if they have not already done so to contact the registration department to sign all necessary forms in order for [place] to release information regarding their care.   Consent: Patient/Guardian gives verbal consent for treatment and assignment of benefits for services provided during this visit. Patient/Guardian  expressed understanding and agreed to proceed.       Note: This document was prepared by [employer] voice dictation technology and any errors that results from this process are unintentional.    Cleotis Nipper, MD 07/21/2022

## 2022-08-08 ENCOUNTER — Encounter: Payer: Self-pay | Admitting: Family Medicine

## 2022-08-08 ENCOUNTER — Ambulatory Visit (INDEPENDENT_AMBULATORY_CARE_PROVIDER_SITE_OTHER): Payer: Medicare Other | Admitting: Family Medicine

## 2022-08-08 ENCOUNTER — Ambulatory Visit (INDEPENDENT_AMBULATORY_CARE_PROVIDER_SITE_OTHER): Payer: Medicare Other

## 2022-08-08 VITALS — BP 138/76 | HR 75 | Temp 98.6°F | Wt 188.8 lb

## 2022-08-08 DIAGNOSIS — M25512 Pain in left shoulder: Secondary | ICD-10-CM

## 2022-08-08 DIAGNOSIS — R0789 Other chest pain: Secondary | ICD-10-CM | POA: Diagnosis not present

## 2022-08-08 DIAGNOSIS — F902 Attention-deficit hyperactivity disorder, combined type: Secondary | ICD-10-CM

## 2022-08-08 MED ORDER — AMPHETAMINE-DEXTROAMPHETAMINE 20 MG PO TABS
20.0000 mg | ORAL_TABLET | Freq: Two times a day (BID) | ORAL | 0 refills | Status: DC
Start: 2022-08-08 — End: 2022-11-07

## 2022-08-08 NOTE — Progress Notes (Signed)
Established Patient Office Visit   Subjective  Patient ID: Michael Durham, male    DOB: 12-Nov-1955  Age: 67 y.o. MRN: [id number]  Chief Complaint  Patient presents with   Medical Management of Chronic Issues    ADHD    Patient is a 67 year old male seen for follow-up and medication management.  Patient states he has been doing well.  No longer taking Trintellix as it cost $450 after insurance.  Taking half a tab Lexapro (10 mg) and Abilify.  Patient notes intermittent left upper chest pain in the last few months.  Patient does not notice any correlation with activity.  Sensation last for few seconds and is moderate in intensity.  Patient denies diaphoresis, lightheadedness, pain that moves during the episodes.  Patient taking aspirin every other day does notice bruising.  Patient also mentions left shoulder pain increasing in the last few months.  Patient has normal ROM of left UE.  Pain can occur if sleeping on right side and arm is hanging.  Able to sleep on left side without issue.  Patient requesting refill on Adderall 20 mg.  Denies issues with sleep, appetite, energy.  Did notice increased drowsiness yesterday but forgot to take second dose.  Went to bed around 9 PM and got up at 4 AM yesterday.    Past Medical History:  Diagnosis Date   ADHD    Allergy    Anxiety    Bipolar 2 disorder (HCC)    Depression    Diverticulosis    ED (erectile dysfunction)    Encephalitis, Age 1    HLD (hyperlipidemia)    Hx of colonic polyp - last colonoscopy with Dr. Salena Saner on 12/21/13 08/03/2018   Vitamin D deficiency    Past Surgical History:  Procedure Laterality Date   BRAIN SURGERY     COLONOSCOPY     CYST EXCISION Right 06/14/2021   Procedure: EXCISION RETINACULAR CYST RIGHT MIDDLE FINGER;  Surgeon: Betha Loa, MD;  Location: [facility];  Service: Orthopedics;  Laterality: Right;  30 MIN   MOUTH SURGERY     TOE SURGERY Left    L big toe   TRIGGER FINGER RELEASE Right  06/14/2021   Procedure: A1 PULLEY RELEASE RIGHT MIDDLE FINGER;  Surgeon: Betha Loa, MD;  Location: [facility];  Service: Orthopedics;  Laterality: Right;   Social History   Tobacco Use   Smoking status: Former   Smokeless tobacco: Never   Tobacco comments:    Quit > 15 years ago, smoked 1-2 ppd for 30 years  Vaping Use   Vaping status: Never Used  Substance Use Topics   Alcohol use: Yes    Comment: occasionally   Drug use: No   Family History  Problem Relation Age of Onset   Dementia Mother    Prostate cancer Father    Bladder Cancer Father    Diabetes Brother    Breast cancer Maternal Aunt    Esophageal cancer Neg Hx    Stomach cancer Neg Hx    Pancreatic cancer Neg Hx    Inflammatory bowel disease Neg Hx    Liver disease Neg Hx    Rectal cancer Neg Hx    Colon cancer Neg Hx    Allergies  Allergen Reactions   Wellbutrin [Bupropion] Other (See Comments)    Severe headache.   Tetanus Toxoids     "old horse serum preparation"      ROS Negative unless stated above  Objective:     BP 138/76 (BP Location: Right Arm, Patient Position: Sitting, Cuff Size: Normal)   Pulse 75   Temp 98.6 F (37 C) (Oral)   Wt 188 lb 12.8 oz (85.6 kg)   SpO2 98%   BMI 27.88 kg/m    Physical Exam Constitutional:      General: He is not in acute distress.    Appearance: Normal appearance.  HENT:     Head: Normocephalic and atraumatic.     Nose: Nose normal.     Mouth/Throat:     Mouth: Mucous membranes are moist.  Cardiovascular:     Rate and Rhythm: Normal rate and regular rhythm.     Heart sounds: Normal heart sounds. No murmur heard.    No gallop.  Pulmonary:     Effort: Pulmonary effort is normal. No respiratory distress.     Breath sounds: Normal breath sounds. No wheezing, rhonchi or rales.  Musculoskeletal:        General: Normal range of motion.     Right shoulder: Normal.     Left shoulder: Bony tenderness present.     Comments: TTP of left  AC joint.  No TTP of left shoulder or scapula.  Normal active and passive ROM of LUE.  Positive left cross arm.  Skin:    General: Skin is warm and dry.  Neurological:     Mental Status: He is alert and oriented to person, place, and time.     No results found for any visits on 08/08/22.    Assessment & Plan:  Attention deficit hyperactivity disorder (ADHD), combined type -Stable -Continue Adderall 20 mg twice daily -PDMP reviewed and appropriate. -     Amphetamine-Dextroamphetamine; Take 1 tablet (20 mg total) by mouth 2 (two) times daily.  Dispense: 60 tablet; Refill: 0 -     Amphetamine-Dextroamphetamine; Take 1 tablet (20 mg total) by mouth 2 (two) times daily.  Dispense: 60 tablet; Refill: 0 -     Amphetamine-Dextroamphetamine; Take 1 tablet (20 mg total) by mouth 2 (two) times daily.  Dispense: 60 tablet; Refill: 0  Arthralgia of left acromioclavicular joint -Discussed likely causes including AC joint arthritis -Will obtain x-ray -Discussed supportive care including topical analgesics, Tylenol or or NSAIDs, heat, ice, stretching joint injection -Consider Ortho referral/PT for continued or worsening symptoms -     DG Shoulder Left  Intermittent left-sided chest pain -Discussed obtaining stress test for continued or worsening symptoms. -Last lipid panel 05/08/2022 with total cholesterol 167, LDL 97, HDL 44.9, triglycerides 125. -Continue lifestyle modifications  Return in about 3 months (around 11/08/2022), or if symptoms worsen or fail to improve.   Deeann Saint, MD

## 2022-08-11 ENCOUNTER — Other Ambulatory Visit: Payer: Self-pay | Admitting: Family Medicine

## 2022-08-11 DIAGNOSIS — F3181 Bipolar II disorder: Secondary | ICD-10-CM

## 2022-08-12 ENCOUNTER — Telehealth: Payer: Self-pay | Admitting: Family Medicine

## 2022-08-12 ENCOUNTER — Encounter: Payer: Self-pay | Admitting: Family Medicine

## 2022-08-12 IMAGING — CT CT ANGIO CHEST
3 of 7 series · 18 of 36 positions shown · IV contrast (omnipaque)
Comparison: Current chest radiograph

CLINICAL DATA: Chest pain.

EXAM:
CT ANGIOGRAPHY CHEST WITH CONTRAST
TECHNIQUE: Multidetector CT imaging of the chest was performed using the
standard protocol during bolus administration of intravenous
contrast. Multiplanar CT image reconstructions and MIPs were
obtained to evaluate the vascular anatomy.
CONTRAST:  100mL OMNIPAQUE IOHEXOL 350 MG/ML SOLN

[Series 5: thins · axial · 0.82mm/px · z∈[+1287,+1587]mm · 13 of 350 slices shown]
[im 25/350  lung]
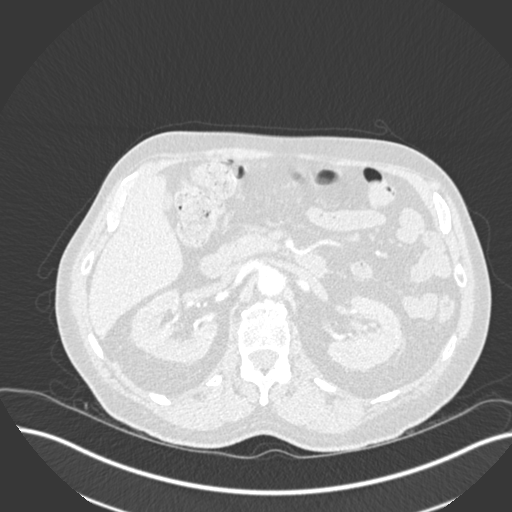
[im 50/350  mediastinal]
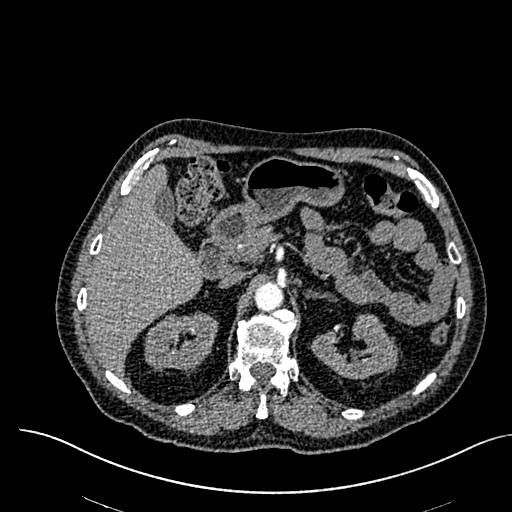
[im 75/350  lung]
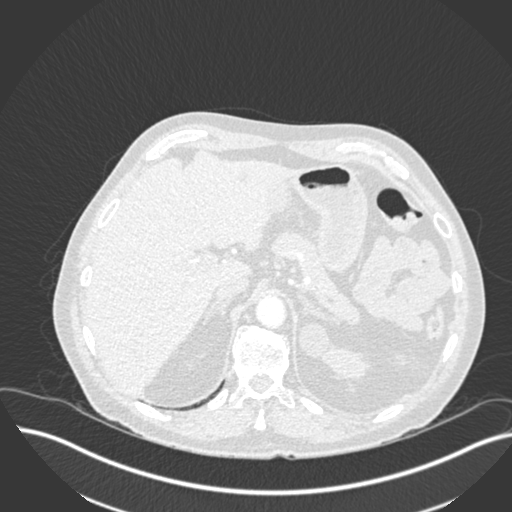
[im 100/350  mediastinal]
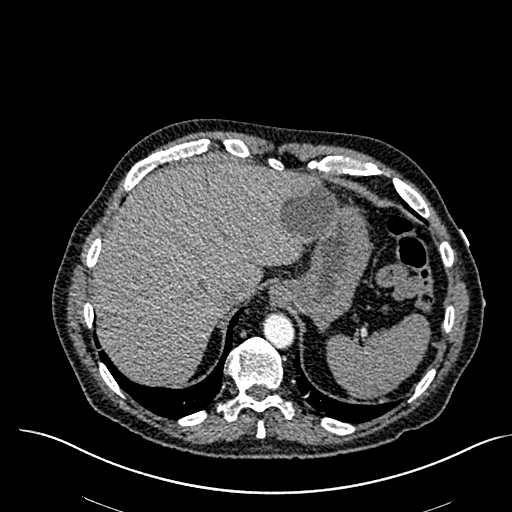
[im 125/350  lung]
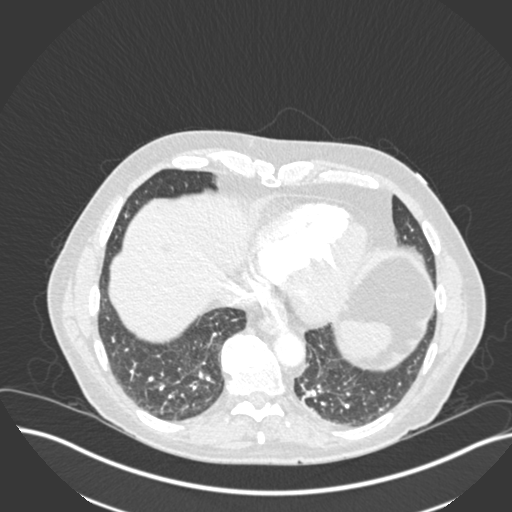
[im 150/350  mediastinal]
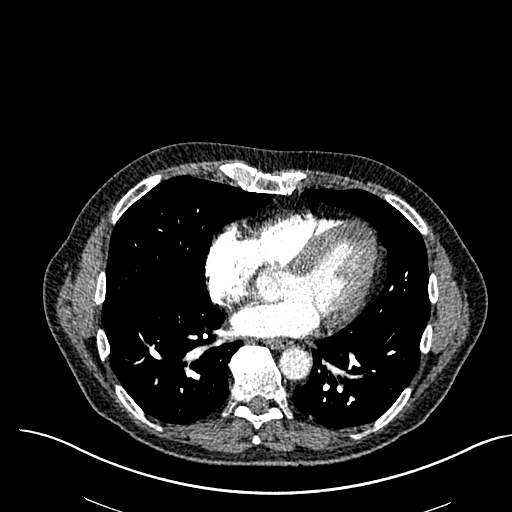
[im 175/350  lung]
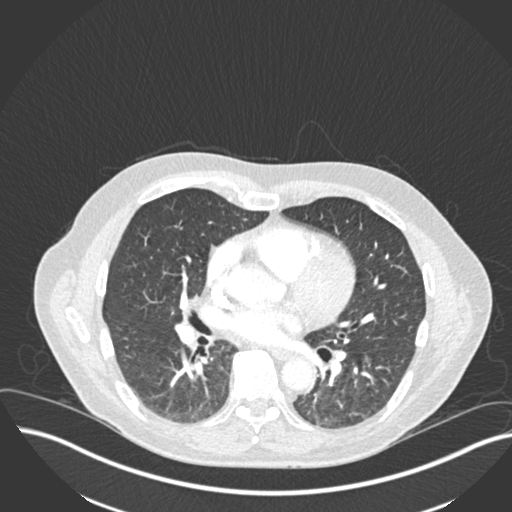
[im 200/350  mediastinal]
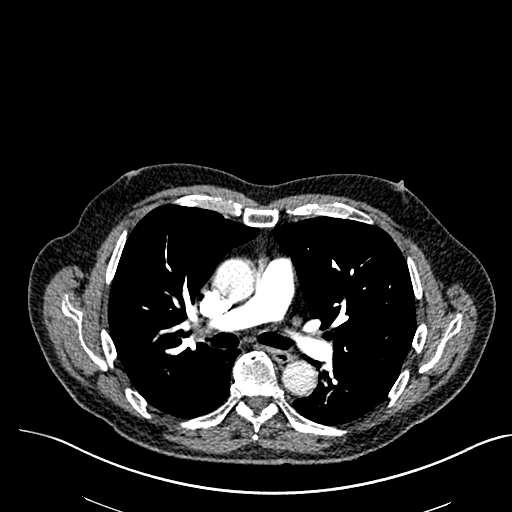
[im 225/350  lung]
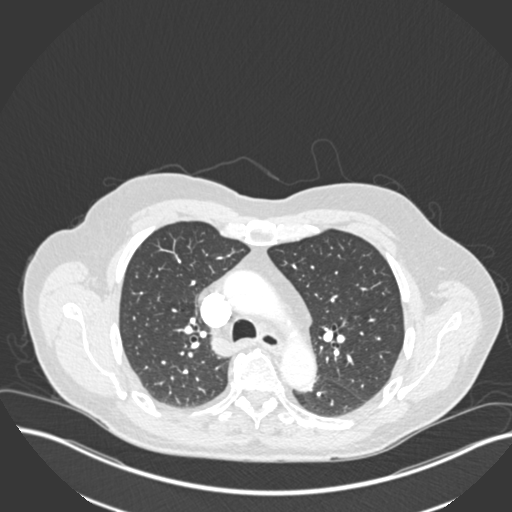
[im 250/350  mediastinal]
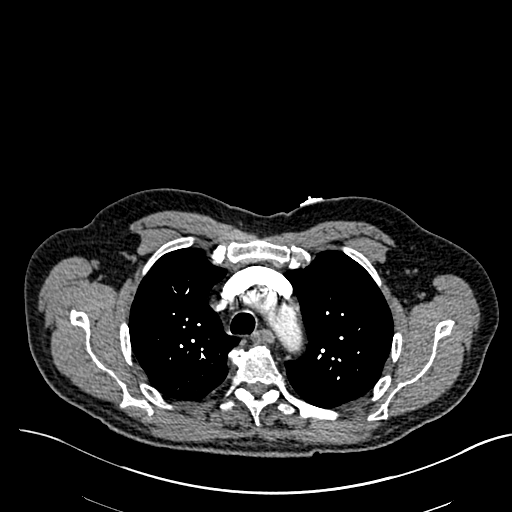
[im 275/350  lung]
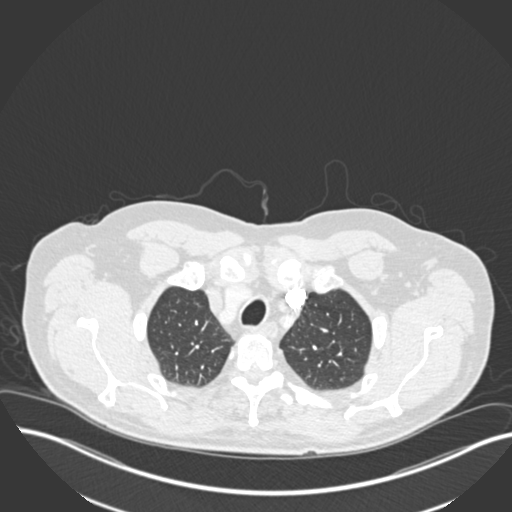
[im 300/350  mediastinal]
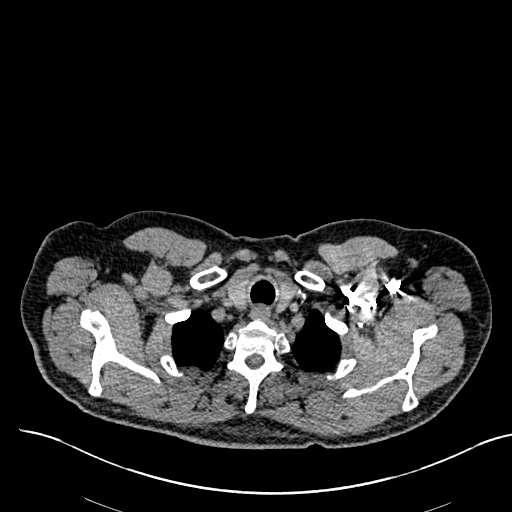
[im 325/350  lung]
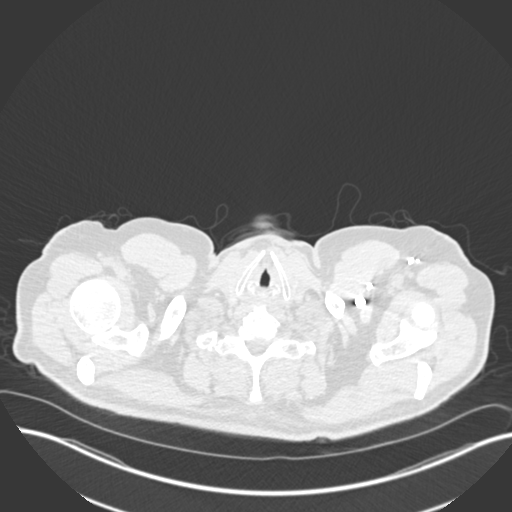

[Series 6: coronal mpr · coronal · 0.65mm/px · 1 of 146 slices shown]
[im 73/146  mediastinal]
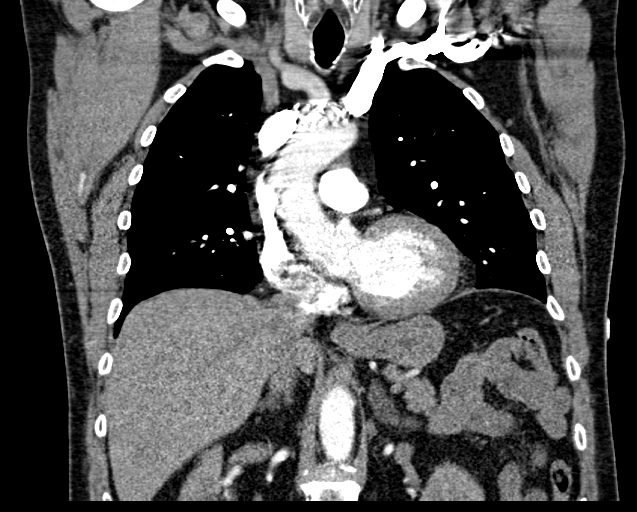

[Series 10: lung · axial · 0.82mm/px · z∈[+1378,+1552]mm · 4 of 147 slices shown]
[im 30/147  mediastinal]
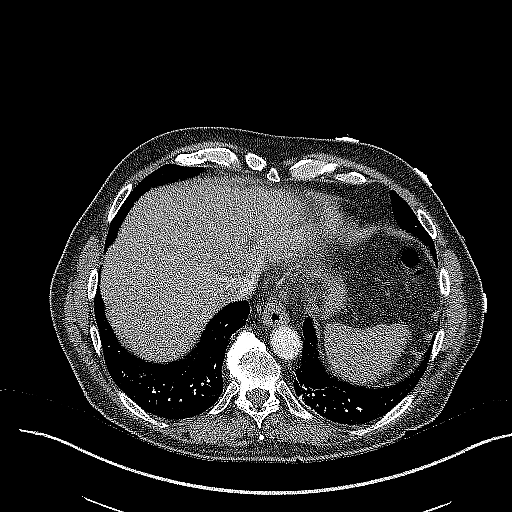
[im 59/147  mediastinal]
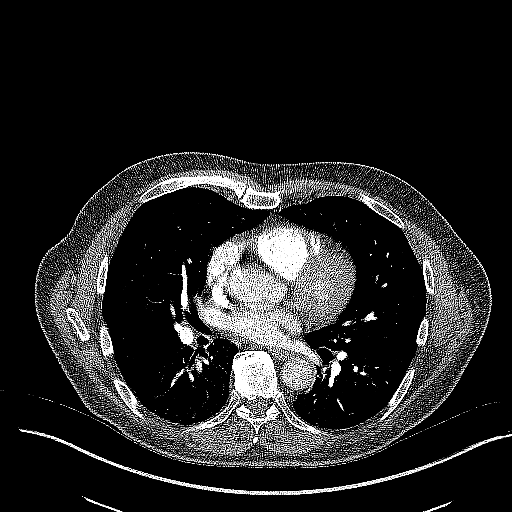
[im 88/147  mediastinal]
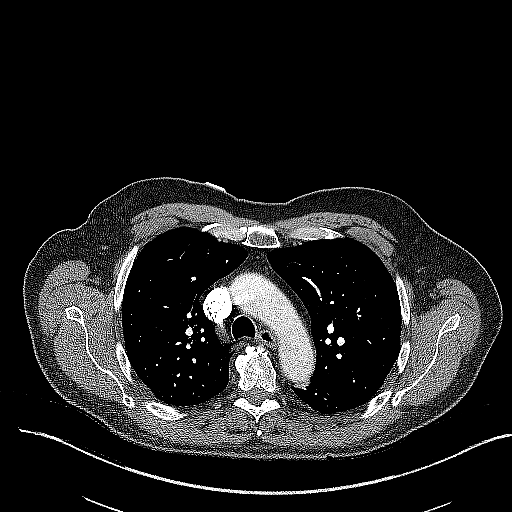
[im 117/147  mediastinal]
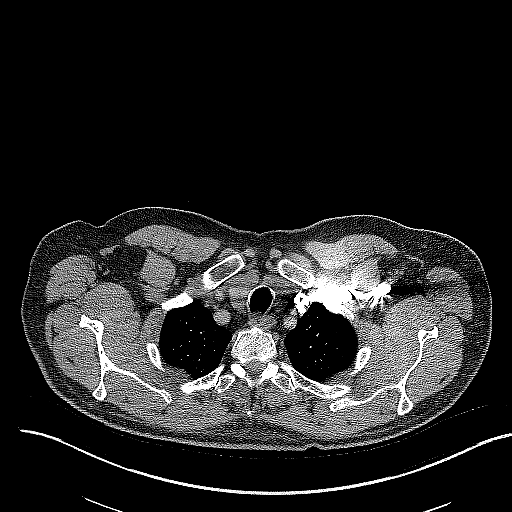

[18 of 36 positions shown; findings below may reference images not displayed]

FINDINGS: Cardiovascular: Pulmonary arteries well opacified. There is no
evidence of a pulmonary embolism.

Heart is normal in size and configuration. No pericardial effusion.
Minor left coronary artery calcification. Great vessels are normal
in caliber. No aortic dissection or significant atherosclerosis.

Mediastinum/Nodes: No enlarged mediastinal, hilar, or axillary lymph
nodes. Thyroid gland, trachea, and esophagus demonstrate no
significant findings.

Lungs/Pleura: Mild linear type opacities at the lung bases
consistent with atelectasis and/or scarring. Lungs otherwise clear.

No pleural effusion or pneumothorax.

Upper Abdomen: No acute findings. Low-density liver masses
consistent with cysts. Single low-density mass from the medial upper
pole of the left kidney, also consistent with a cyst.

Musculoskeletal: No fracture or acute finding.  No bone lesion.

Review of the MIP images confirms the above findings.
IMPRESSION: 1. No evidence of a pulmonary embolism or aortic dissection.
2. No acute findings.
3. Minor left coronary artery calcification.

## 2022-08-12 MED ORDER — ARIPIPRAZOLE 5 MG PO TABS
5.0000 mg | ORAL_TABLET | Freq: Every day | ORAL | 0 refills | Status: DC
Start: 2022-08-12 — End: 2022-11-07

## 2022-08-12 NOTE — Telephone Encounter (Signed)
Prescription Request  08/12/2022  LOV: 08/08/2022  What is the name of the medication or equipment?   rosuvastatin (CRESTOR) 10 MG tablet tadalafil (CIALIS) 5 MG tablet tamsulosin (FLOMAX) 0.4 MG CAPS capsule escitalopram (LEXAPRO) 20 MG tablet   Have you contacted your pharmacy to request a refill? Yes   Which pharmacy would you like this sent to?   [facility] [address] [place], [place] - [address] [place] [address] Phone: [phone] Fax: [phone]    Patient notified that their request is being sent to the clinical staff for review and that they should receive a response within 2 business days.   Please advise at Mobile [phone] (mobile)

## 2022-08-12 NOTE — Telephone Encounter (Signed)
Refills are at the pharmacy  

## 2022-09-29 ENCOUNTER — Ambulatory Visit (INDEPENDENT_AMBULATORY_CARE_PROVIDER_SITE_OTHER): Payer: Medicare Other

## 2022-09-29 DIAGNOSIS — Z23 Encounter for immunization: Secondary | ICD-10-CM

## 2022-11-07 ENCOUNTER — Ambulatory Visit (INDEPENDENT_AMBULATORY_CARE_PROVIDER_SITE_OTHER): Payer: Medicare Other | Admitting: Family Medicine

## 2022-11-07 VITALS — BP 138/74 | HR 70 | Temp 97.9°F | Ht 69.0 in | Wt 190.6 lb

## 2022-11-07 DIAGNOSIS — N529 Male erectile dysfunction, unspecified: Secondary | ICD-10-CM

## 2022-11-07 DIAGNOSIS — R351 Nocturia: Secondary | ICD-10-CM

## 2022-11-07 DIAGNOSIS — N401 Enlarged prostate with lower urinary tract symptoms: Secondary | ICD-10-CM

## 2022-11-07 DIAGNOSIS — F902 Attention-deficit hyperactivity disorder, combined type: Secondary | ICD-10-CM

## 2022-11-07 DIAGNOSIS — F3181 Bipolar II disorder: Secondary | ICD-10-CM | POA: Diagnosis not present

## 2022-11-07 DIAGNOSIS — E782 Mixed hyperlipidemia: Secondary | ICD-10-CM

## 2022-11-07 MED ORDER — TAMSULOSIN HCL 0.4 MG PO CAPS
0.4000 mg | ORAL_CAPSULE | Freq: Every day | ORAL | 3 refills | Status: DC
Start: 2022-11-07 — End: 2023-05-18

## 2022-11-07 MED ORDER — AMPHETAMINE-DEXTROAMPHETAMINE 20 MG PO TABS: 20.0000 mg | ORAL_TABLET | Freq: Two times a day (BID) | ORAL | 0 refills | Status: DC

## 2022-11-07 MED ORDER — ARIPIPRAZOLE 5 MG PO TABS: 5.0000 mg | ORAL_TABLET | Freq: Every day | ORAL | 3 refills | Status: DC

## 2022-11-07 MED ORDER — TADALAFIL 5 MG PO TABS
ORAL_TABLET | ORAL | 5 refills | Status: DC
Start: 2022-11-07 — End: 2023-05-18

## 2022-11-07 MED ORDER — ROSUVASTATIN CALCIUM 10 MG PO TABS
10.0000 mg | ORAL_TABLET | ORAL | 3 refills | Status: DC
Start: 2022-11-10 — End: 2023-07-13

## 2022-11-07 MED ORDER — ESCITALOPRAM OXALATE 20 MG PO TABS: ORAL_TABLET | ORAL | 3 refills | Status: DC

## 2022-11-07 NOTE — Progress Notes (Signed)
Established Patient Office Visit   Subjective  Patient ID: Michael Durham, male    DOB: 1955/09/24  Age: 67 y.o. MRN: [id number]  Chief Complaint  Patient presents with   Medical Management of Chronic Issues    Medication refill     Patient is a 67 year old male seen for follow-up on chronic conditions and refills.  Patient states he is doing well overall.  ADHD stable on Adderall 20 mg daily.  Patient states sleep has been good.  Had 2 nights this week where he slept at least 8 hours.  Appetite, mood, and energy are also good.  Mood stable on Abilify and Lexapro.  Pt staying busy doing projects around the house.  Patient taking Crestor 20 mg every other day.  Denies myalgias or arthralgias.  Pt considering second dose of shingles vaccine however would like to wait when his wife is home as he felt sick after the last shingles vaccine.    Patient Active Problem List   Diagnosis Date Noted   Colon cancer screening 04/09/2019   Hx of colonic polyps 04/09/2019   Diverticulosis 08/03/2018   Hx of colonic polyp - last colonoscopy with Dr. Salena Saner on 12/21/13 08/03/2018   Vitamin D deficiency 11/12/2016   Recurrent major depressive episodes, moderate (HCC) 11/12/2016   HLD (hyperlipidemia)    ADHD    ED (erectile dysfunction)    Bipolar 2 disorder (HCC)    Past Medical History:  Diagnosis Date   ADHD    Allergy    Anxiety    Bipolar 2 disorder (HCC)    Depression    Diverticulosis    ED (erectile dysfunction)    Encephalitis, Age 81    HLD (hyperlipidemia)    Hx of colonic polyp - last colonoscopy with Dr. Salena Saner on 12/21/13 08/03/2018   Vitamin D deficiency    Past Surgical History:  Procedure Laterality Date   BRAIN SURGERY     COLONOSCOPY     CYST EXCISION Right 06/14/2021   Procedure: EXCISION RETINACULAR CYST RIGHT MIDDLE FINGER;  Surgeon: Betha Loa, MD;  Location: [facility];  Service: Orthopedics;  Laterality: Right;  30 MIN   MOUTH SURGERY     TOE  SURGERY Left    L big toe   TRIGGER FINGER RELEASE Right 06/14/2021   Procedure: A1 PULLEY RELEASE RIGHT MIDDLE FINGER;  Surgeon: Betha Loa, MD;  Location: [facility];  Service: Orthopedics;  Laterality: Right;   Social History   Tobacco Use   Smoking status: Former   Smokeless tobacco: Never   Tobacco comments:    Quit > 15 years ago, smoked 1-2 ppd for 30 years  Vaping Use   Vaping status: Never Used  Substance Use Topics   Alcohol use: Yes    Comment: occasionally   Drug use: No   Family History  Problem Relation Age of Onset   Dementia Mother    Prostate cancer Father    Bladder Cancer Father    Diabetes Brother    Breast cancer Maternal Aunt    Esophageal cancer Neg Hx    Stomach cancer Neg Hx    Pancreatic cancer Neg Hx    Inflammatory bowel disease Neg Hx    Liver disease Neg Hx    Rectal cancer Neg Hx    Colon cancer Neg Hx    Allergies  Allergen Reactions   Wellbutrin [Bupropion] Other (See Comments)    Severe headache.   Tetanus Toxoid Other (See  Comments)    Reaction: Fever (intolerance); "old horse serum preparation"   Tetanus Toxoids     "old horse serum preparation"      ROS Negative unless stated above    Objective:     BP 138/74 (BP Location: Left Arm, Patient Position: Sitting, Cuff Size: Large)   Pulse 70   Temp 97.9 F (36.6 C) (Oral)   Ht 5\' 9"  (1.753 m)   Wt 190 lb 9.6 oz (86.5 kg)   SpO2 97%   BMI 28.15 kg/m  BP Readings from Last 3 Encounters:  11/07/22 138/74  08/08/22 138/76  05/08/22 124/72   Wt Readings from Last 3 Encounters:  11/07/22 190 lb 9.6 oz (86.5 kg)  08/08/22 188 lb 12.8 oz (85.6 kg)  05/08/22 186 lb 6.4 oz (84.6 kg)      Physical Exam Constitutional:      General: He is not in acute distress.    Appearance: Normal appearance.  HENT:     Head: Normocephalic and atraumatic.     Nose: Nose normal.     Mouth/Throat:     Mouth: Mucous membranes are moist.  Cardiovascular:     Rate and  Rhythm: Normal rate and regular rhythm.     Heart sounds: Normal heart sounds. No murmur heard.    No gallop.  Pulmonary:     Effort: Pulmonary effort is normal. No respiratory distress.     Breath sounds: Normal breath sounds. No wheezing, rhonchi or rales.  Skin:    General: Skin is warm and dry.  Neurological:     Mental Status: He is alert and oriented to person, place, and time.      No results found for any visits on 11/07/22.    Assessment & Plan:  Attention deficit hyperactivity disorder (ADHD), combined type -Controlled -PDMP reviewed and appropriate -Continue Adderall 20 mg twice daily -     Amphetamine-Dextroamphetamine; Take 1 tablet (20 mg total) by mouth 2 (two) times daily.  Dispense: 60 tablet; Refill: 0 -     Amphetamine-Dextroamphetamine; Take 1 tablet (20 mg total) by mouth 2 (two) times daily.  Dispense: 60 tablet; Refill: 0 -     Amphetamine-Dextroamphetamine; Take 1 tablet (20 mg total) by mouth 2 (two) times daily.  Dispense: 60 tablet; Refill: 0  Bipolar 2 disorder (HCC) -Stable -     ARIPiprazole; Take 1 tablet (5 mg total) by mouth at bedtime.  Dispense: 90 tablet; Refill: 3 -     Escitalopram Oxalate; Take 1 tablet by mouth once daily  Dispense: 90 tablet; Refill: 3  Mixed hyperlipidemia -Controlled -Total cholesterol 167, HDL 44.9, LDL 97, triglycerides 125 on 05/08/2022 -Continue rosuvastatin 10 mg twice a week. -     Rosuvastatin Calcium; Take 1 tablet (10 mg total) by mouth 2 (two) times a week.  Dispense: 90 tablet; Refill: 3  Erectile dysfunction, unspecified erectile dysfunction type -     Tadalafil; 1-3 tabs po daily as needed.  Dispense: 30 tablet; Refill: 5  Benign prostatic hyperplasia with nocturia -     Tamsulosin HCl; Take 1 capsule (0.4 mg total) by mouth daily.  Dispense: 90 capsule; Refill: 3   Return in about 3 months (around 02/07/2023).   Deeann Saint, MD

## 2023-02-18 ENCOUNTER — Encounter: Payer: Self-pay | Admitting: Family Medicine

## 2023-02-18 ENCOUNTER — Ambulatory Visit (INDEPENDENT_AMBULATORY_CARE_PROVIDER_SITE_OTHER): Payer: Medicare Other | Admitting: Family Medicine

## 2023-02-18 ENCOUNTER — Ambulatory Visit (INDEPENDENT_AMBULATORY_CARE_PROVIDER_SITE_OTHER): Payer: Medicare Other

## 2023-02-18 VITALS — BP 140/74 | HR 84 | Temp 97.8°F | Ht 69.0 in | Wt 191.4 lb

## 2023-02-18 DIAGNOSIS — M7989 Other specified soft tissue disorders: Secondary | ICD-10-CM | POA: Diagnosis not present

## 2023-02-18 DIAGNOSIS — J342 Deviated nasal septum: Secondary | ICD-10-CM

## 2023-02-18 DIAGNOSIS — F3181 Bipolar II disorder: Secondary | ICD-10-CM

## 2023-02-18 DIAGNOSIS — M6740 Ganglion, unspecified site: Secondary | ICD-10-CM | POA: Diagnosis not present

## 2023-02-18 DIAGNOSIS — R030 Elevated blood-pressure reading, without diagnosis of hypertension: Secondary | ICD-10-CM | POA: Diagnosis not present

## 2023-02-18 DIAGNOSIS — F902 Attention-deficit hyperactivity disorder, combined type: Secondary | ICD-10-CM

## 2023-02-18 MED ORDER — ESCITALOPRAM OXALATE 20 MG PO TABS: ORAL_TABLET | ORAL | 3 refills | Status: DC

## 2023-02-18 MED ORDER — ARIPIPRAZOLE 5 MG PO TABS: 5.0000 mg | ORAL_TABLET | Freq: Every day | ORAL | 3 refills | Status: DC

## 2023-02-18 NOTE — Patient Instructions (Addendum)
Referral to ENT was placed.  They will contact you to setting up an appointment.  Order for x-ray was placed.  You can have this done today while you are in clinic.  Refills on Abilify and Lexapro were sent to your pharmacy.  The computer system that allows me to send the Adderall currently not working but I will resend the prescription later today.

## 2023-02-18 NOTE — Progress Notes (Signed)
Established Patient Office Visit   Subjective  Patient ID: Michael Durham, male    DOB: 10-29-1955  Age: 68 y.o. MRN: [id number]  Chief Complaint  Patient presents with  . Medication Refill  . Hand Pain    Left ring finger swelling, started 2 months ago, patient has pain when bending     Patient is a 68 year old male seen for follow-up and ongoing concern.  Patient endorses edema of left ring finger x 2 months.  Denies injury, increased activity, bumps, increased warmth, erythema of finger or hand.  Patient woke up 1 morning with edema.  Had to cut off wedding band.  Able to bend finger some.  Denies pain at this time.  Took Tylenol for symptoms.  Patient inquires about a bump on right wrist.  Thought it felt like a ganglion cyst.  Endorses having 1 in the past that was removed.  Area is not painful.  Patient endorses BP being elevated at recent surgery for cataract of left eye.  Has surgery in a week for right eye.  Not checking BP at home.  Patient drinks several pots of coffee per day.  Endorses poor sleep last night.  Up around 3 AM with difficulty falling back asleep as did not want to miss appointment.  Patient endorses history of nasal surgery over 40 years ago.  States has difficulty breathing and right side of nose and feels like left side collapses at times.  Using saline nasal rinse.  Patient requesting refills on Adderall, Abilify, and Lexapro.   Patient Active Problem List   Diagnosis Date Noted  . Colon cancer screening 04/09/2019  . Hx of colonic polyps 04/09/2019  . Diverticulosis 08/03/2018  . Hx of colonic polyp - last colonoscopy with Dr. Salena Saner on 12/21/13 08/03/2018  . Vitamin D deficiency 11/12/2016  . Recurrent major depressive episodes, moderate (HCC) 11/12/2016  . HLD (hyperlipidemia)   . ADHD   . ED (erectile dysfunction)   . Bipolar 2 disorder [facility])    Past Medical History:  Diagnosis Date  . ADHD   . Allergy   . Anxiety   . Bipolar 2 disorder  (HCC)   . Depression   . Diverticulosis   . ED (erectile dysfunction)   . Encephalitis, Age 86   . HLD (hyperlipidemia)   . Hx of colonic polyp - last colonoscopy with Dr. Salena Saner on 12/21/13 08/03/2018  . Vitamin D deficiency    Past Surgical History:  Procedure Laterality Date  . BRAIN SURGERY    . COLONOSCOPY    . CYST EXCISION Right 06/14/2021   Procedure: EXCISION RETINACULAR CYST RIGHT MIDDLE FINGER;  Surgeon: Betha Loa, MD;  Location: [facility];  Service: Orthopedics;  Laterality: Right;  30 MIN  . MOUTH SURGERY    . TOE SURGERY Left    L big toe  . TRIGGER FINGER RELEASE Right 06/14/2021   Procedure: A1 PULLEY RELEASE RIGHT MIDDLE FINGER;  Surgeon: Betha Loa, MD;  Location: [facility];  Service: Orthopedics;  Laterality: Right;   Social History   Tobacco Use  . Smoking status: Former  . Smokeless tobacco: Never  . Tobacco comments:    Quit > 15 years ago, smoked 1-2 ppd for 30 years  Vaping Use  . Vaping status: Never Used  Substance Use Topics  . Alcohol use: Yes    Comment: occasionally  . Drug use: No   Family History  Problem Relation Age of Onset  . Dementia  Mother   . Prostate cancer Father   . Bladder Cancer Father   . Diabetes Brother   . Breast cancer Maternal Aunt   . Esophageal cancer Neg Hx   . Stomach cancer Neg Hx   . Pancreatic cancer Neg Hx   . Inflammatory bowel disease Neg Hx   . Liver disease Neg Hx   . Rectal cancer Neg Hx   . Colon cancer Neg Hx    Allergies  Allergen Reactions  . Wellbutrin [Bupropion] Other (See Comments)    Severe headache.  . Tetanus Toxoid Other (See Comments)    Reaction: Fever (intolerance); "old horse serum preparation"  . Tetanus Toxoids     "old horse serum preparation"    ROS Negative unless stated above    Objective:     BP (!) 150/76 (BP Location: Left Arm, Patient Position: Sitting, Cuff Size: Large)   Pulse 84   Temp 97.8 F (36.6 C) (Oral)   Ht 5\' 9"   (1.753 m)   Wt 191 lb 6.4 oz (86.8 kg)   SpO2 99%   BMI 28.26 kg/m  BP Readings from Last 3 Encounters:  02/18/23 (!) 150/76  11/07/22 138/74  08/08/22 138/76   Wt Readings from Last 3 Encounters:  02/18/23 191 lb 6.4 oz (86.8 kg)  11/07/22 190 lb 9.6 oz (86.5 kg)  08/08/22 188 lb 12.8 oz (85.6 kg)      Physical Exam Constitutional:      General: He is not in acute distress.    Appearance: Normal appearance.  HENT:     Head: Normocephalic and atraumatic.     Nose: Septal deviation present.     Mouth/Throat:     Mouth: Mucous membranes are moist.  Cardiovascular:     Rate and Rhythm: Normal rate and regular rhythm.     Heart sounds: Normal heart sounds. No murmur heard.    No gallop.  Pulmonary:     Effort: Pulmonary effort is normal. No respiratory distress.     Breath sounds: Normal breath sounds. No wheezing, rhonchi or rales.  Skin:    General: Skin is warm and dry.     Comments: 1 cm ganglion cyst anterior R wrist.  Neurological:     Mental Status: He is alert and oriented to person, place, and time.     No results found for any visits on 02/18/23.    Assessment & Plan:  Swelling of left ring finger -Discussed possible causes including osteoarthritis, gout/pseudogout, RA -Obtain x-ray -Continue supportive care with Tylenol, ice, etc. -Further recommendations based on imaging. -     DG Hand Complete Left; Future  Bipolar 2 disorder (HCC) -Stable -Lexapro and Abilify refilled. -Continue counseling -     ARIPiprazole; Take 1 tablet (5 mg total) by mouth at bedtime.  Dispense: 90 tablet; Refill: 3 -     Escitalopram Oxalate; Take 1 tablet by mouth once daily  Dispense: 90 tablet; Refill: 3  Elevated blood pressure reading in office without diagnosis of hypertension -BP elevated this morning -Recheck -Patient advised to monitor BP at home and keep a log to bring with him to clinic.  For elevations consistently greater than 140/90 start  medication.  Ganglion cyst -Anterior right lateral wrist. -Asymptomatic -Discussed referral to to hand if removal desired.  Continue to monitor.  Deviated septum [place] -Referral to ENT for further evaluation given chronic congestion -     Ambulatory referral to ENT  Attention deficit hyperactivity disorder (ADHD),  combined type -PDMP reviewed and appropriate -Will refill Adderall x 3 months.  App not currently working with providers phone.  Will resend prescriptions later today.   Return in about 3 months (around 05/18/2023) for Refills and blood pressure check.   Deeann Saint, MD

## 2023-02-19 ENCOUNTER — Encounter: Payer: Self-pay | Admitting: Family Medicine

## 2023-02-19 MED ORDER — AMPHETAMINE-DEXTROAMPHETAMINE 20 MG PO TABS: 20.0000 mg | ORAL_TABLET | Freq: Two times a day (BID) | ORAL | 0 refills | Status: DC

## 2023-02-19 MED ORDER — AMPHETAMINE-DEXTROAMPHETAMINE 20 MG PO TABS
20.0000 mg | ORAL_TABLET | Freq: Two times a day (BID) | ORAL | 0 refills | Status: DC
Start: 2023-02-19 — End: 2023-05-18

## 2023-02-19 NOTE — Addendum Note (Signed)
Addended by: Deeann Saint on: 02/19/2023 08:27 AM   Modules accepted: Orders

## 2023-02-20 NOTE — Telephone Encounter (Signed)
Message has been sent and seen in my chart patient is aware

## 2023-02-27 ENCOUNTER — Telehealth (INDEPENDENT_AMBULATORY_CARE_PROVIDER_SITE_OTHER): Payer: Medicare Other | Admitting: Family Medicine

## 2023-02-27 ENCOUNTER — Encounter: Payer: Self-pay | Admitting: Family Medicine

## 2023-02-27 DIAGNOSIS — M7989 Other specified soft tissue disorders: Secondary | ICD-10-CM

## 2023-02-27 NOTE — Progress Notes (Signed)
 Patient was unable to self-report due to a lack of equipment at home via telehealth

## 2023-02-27 NOTE — Progress Notes (Signed)
 Virtual Visit via Video Note  I connected with Michael Durham on 02/27/23 at  9:30 AM EST by a video enabled telemedicine application and verified that I am speaking with the correct person using two identifiers.  Location patient: home Location provider:work or home office Persons participating in the virtual visit: patient, provider  I discussed the limitations of evaluation and management by telemedicine and the availability of in person appointments. The patient expressed understanding and agreed to proceed. Chief Complaint  Patient presents with   Follow-up    Ring finger, follow-up, still having swelling unable to grip anything would like a referral to hand     HPI: Pt is a 68 yo male seen for f/u.  Pt seen 2/12 for unprovoked L 4th digit edema x months.  Finger worse.  Difficulty gripping items.  Tender at MCP.  Worse first thing in am.  Pt tried stretching, tylenol 1500 mg/ day max due to recent eye surgery.  Pt also inquires about ENT referral as he has not heard anything back.  Last ENT referral was in May 2024, however Dr. was not available on day of patient's appointment.   ROS: See pertinent positives and negatives per HPI.  Past Medical History:  Diagnosis Date   ADHD    Allergy    Anxiety    Bipolar 2 disorder (HCC)    Depression    Diverticulosis    ED (erectile dysfunction)    Encephalitis, Age 65    HLD (hyperlipidemia)    Hx of colonic polyp - last colonoscopy with Dr. Salena Saner on 12/21/13 08/03/2018   Vitamin D deficiency     Past Surgical History:  Procedure Laterality Date   BRAIN SURGERY     COLONOSCOPY     CYST EXCISION Right 06/14/2021   Procedure: EXCISION RETINACULAR CYST RIGHT MIDDLE FINGER;  Surgeon: Betha Loa, MD;  Location: [facility];  Service: Orthopedics;  Laterality: Right;  30 MIN   MOUTH SURGERY     TOE SURGERY Left    L big toe   TRIGGER FINGER RELEASE Right 06/14/2021   Procedure: A1 PULLEY RELEASE RIGHT MIDDLE FINGER;   Surgeon: Betha Loa, MD;  Location: [facility];  Service: Orthopedics;  Laterality: Right;    Family History  Problem Relation Age of Onset   Dementia Mother    Prostate cancer Father    Bladder Cancer Father    Diabetes Brother    Breast cancer Maternal Aunt    Esophageal cancer Neg Hx    Stomach cancer Neg Hx    Pancreatic cancer Neg Hx    Inflammatory bowel disease Neg Hx    Liver disease Neg Hx    Rectal cancer Neg Hx    Colon cancer Neg Hx     Current Outpatient Medications:    amphetamine-dextroamphetamine (ADDERALL) 20 MG tablet, Take 1 tablet (20 mg total) by mouth 2 (two) times daily., Disp: 60 tablet, Rfl: 0   amphetamine-dextroamphetamine (ADDERALL) 20 MG tablet, Take 1 tablet (20 mg total) by mouth 2 (two) times daily., Disp: 60 tablet, Rfl: 0   amphetamine-dextroamphetamine (ADDERALL) 20 MG tablet, Take 1 tablet (20 mg total) by mouth 2 (two) times daily., Disp: 60 tablet, Rfl: 0   ARIPiprazole (ABILIFY) 5 MG tablet, Take 1 tablet (5 mg total) by mouth at bedtime., Disp: 90 tablet, Rfl: 3   aspirin EC 81 MG tablet, Take 1 tablet (81 mg total) by mouth daily. Swallow whole., Disp: 90 tablet, Rfl: 3  Cholecalciferol (VITAMIN D) 2000 units CAPS, Take 2,000 Units by mouth daily., Disp: , Rfl:    escitalopram (LEXAPRO) 20 MG tablet, Take 1 tablet by mouth once daily, Disp: 90 tablet, Rfl: 3   latanoprost (XALATAN) 0.005 % ophthalmic solution, INSTILL 1 DROP INTO EACH EYE AT BEDTIME, Disp: , Rfl:    Omega-3 Fatty Acids (FISH OIL) 1200 MG CAPS, Take 1 capsule by mouth daily at 12 noon., Disp: , Rfl:    tadalafil (CIALIS) 5 MG tablet, 1-3 tabs po daily as needed., Disp: 30 tablet, Rfl: 5   tamsulosin (FLOMAX) 0.4 MG CAPS capsule, Take 1 capsule (0.4 mg total) by mouth daily., Disp: 90 capsule, Rfl: 3   rosuvastatin (CRESTOR) 10 MG tablet, Take 1 tablet (10 mg total) by mouth 2 (two) times a week. (Patient not taking: Reported on 02/27/2023), Disp: 90 tablet, Rfl:  3  EXAM:  VITALS per patient if applicable:  RR between 12-20 bpm  GENERAL: alert, oriented, appears well and in no acute distress  HEENT: atraumatic, conjunctiva clear, no obvious abnormalities on inspection of external nose and ears  NECK: normal movements of the head and neck  LUNGS: on inspection no signs of respiratory distress, breathing rate appears normal, no obvious gross SOB, gasping or wheezing  CV: no obvious cyanosis  MS: moves all visible extremities.  L 4th digit edema.  PSYCH/NEURO: pleasant and cooperative, no obvious depression or anxiety, speech and thought processing grossly intact  ASSESSMENT AND PLAN:  Discussed the following assessment and plan:  Swelling of left ring finger - Plan: Ambulatory referral to Hand Surgery Patient with continued edema of left fourth digit and tenderness at MCP joint.  X-ray of hand on 02/18/2023 negative.  Referral to hand surgery placed.  Continue supportive care.  Given strict precautions.  Referral coordinator contacted in regards to patient's ENT referral.  Appears new referral was sent to [facility].  Patient to reach out in regards to the appointment.    I discussed the assessment and treatment plan with the patient. The patient was provided an opportunity to ask questions and all were answered. The patient agreed with the plan and demonstrated an understanding of the instructions.   The patient was advised to call back or seek an in-person evaluation if the symptoms worsen or if the condition fails to improve as anticipated.   Deeann Saint, MD

## 2023-03-03 ENCOUNTER — Encounter: Payer: Self-pay | Admitting: Family Medicine

## 2023-04-13 ENCOUNTER — Institutional Professional Consult (permissible substitution) (INDEPENDENT_AMBULATORY_CARE_PROVIDER_SITE_OTHER): Payer: Medicare Other | Admitting: Otolaryngology

## 2023-05-08 ENCOUNTER — Institutional Professional Consult (permissible substitution) (INDEPENDENT_AMBULATORY_CARE_PROVIDER_SITE_OTHER): Admitting: Otolaryngology

## 2023-05-11 ENCOUNTER — Telehealth

## 2023-05-11 DIAGNOSIS — Z7189 Other specified counseling: Secondary | ICD-10-CM | POA: Diagnosis not present

## 2023-05-11 DIAGNOSIS — U071 COVID-19: Secondary | ICD-10-CM | POA: Diagnosis not present

## 2023-05-11 NOTE — Patient Instructions (Addendum)
 Bonni Butter, thank you for joining Lanetta Pion, NP for today's virtual visit.  While this provider is not your primary care provider (PCP), if your PCP is located in our provider database this encounter information will be shared with them immediately following your visit.   A [facility] MyChart account gives you access to today's visit and all your visits, tests, and labs performed at [facility] " click here if you don't have a [facility] MyChart account or go to [email or web address]  Consent: (Patient) Bonni Butter provided verbal consent for this virtual visit at the beginning of the encounter.  Current Medications:  Current Outpatient Medications:    amphetamine -dextroamphetamine  (ADDERALL) 20 MG tablet, Take 1 tablet (20 mg total) by mouth 2 (two) times daily., Disp: 60 tablet, Rfl: 0   amphetamine -dextroamphetamine  (ADDERALL) 20 MG tablet, Take 1 tablet (20 mg total) by mouth 2 (two) times daily., Disp: 60 tablet, Rfl: 0   amphetamine -dextroamphetamine  (ADDERALL) 20 MG tablet, Take 1 tablet (20 mg total) by mouth 2 (two) times daily., Disp: 60 tablet, Rfl: 0   ARIPiprazole  (ABILIFY ) 5 MG tablet, Take 1 tablet (5 mg total) by mouth at bedtime., Disp: 90 tablet, Rfl: 3   aspirin  EC 81 MG tablet, Take 1 tablet (81 mg total) by mouth daily. Swallow whole., Disp: 90 tablet, Rfl: 3   Cholecalciferol (VITAMIN D ) 2000 units CAPS, Take 2,000 Units by mouth daily., Disp: , Rfl:    escitalopram  (LEXAPRO ) 20 MG tablet, Take 1 tablet by mouth once daily, Disp: 90 tablet, Rfl: 3   latanoprost (XALATAN) 0.005 % ophthalmic solution, INSTILL 1 DROP INTO EACH EYE AT BEDTIME, Disp: , Rfl:    Omega-3 Fatty Acids (FISH OIL) 1200 MG CAPS, Take 1 capsule by mouth daily at 12 noon., Disp: , Rfl:    rosuvastatin  (CRESTOR ) 10 MG tablet, Take 1 tablet (10 mg total) by mouth 2 (two) times a week. (Patient not taking: Reported on 02/27/2023), Disp: 90 tablet, Rfl: 3   tadalafil   (CIALIS ) 5 MG tablet, 1-3 tabs po daily as needed., Disp: 30 tablet, Rfl: 5   tamsulosin  (FLOMAX ) 0.4 MG CAPS capsule, Take 1 capsule (0.4 mg total) by mouth daily., Disp: 90 capsule, Rfl: 3   Medications ordered in this encounter:  No orders of the defined types were placed in this encounter.    *If you need refills on other medications prior to your next appointment, please contact your pharmacy*  Follow-Up: Call back or seek an in-person evaluation if the symptoms worsen or if the condition fails to improve as anticipated.   Virtual Care [phone]  Care Instructions:  - Increased rest - Increasing Fluids - Acetaminophen  / ibuprofen as needed for fever/pain.  - Salt water gargling, chloraseptic spray and throat lozenges -Claritin and nasal spray daily  - Saline nasal spray if congestion or if nasal passages feel dry. - Humidifying the air.     Isolation Instructions: You are to isolate at home until you have been fever free for at least 24 hours without a fever-reducing medication, and symptoms have been steadily improving for 24 hours. At that time,  you can end isolation but need to mask for an additional 5 days.   If you must be around other household members who do not have symptoms, you need to make sure that both you and the family members are masking consistently with a high-quality mask.  If you note any worsening of symptoms despite treatment, please seek an  in-person evaluation ASAP. If you note any significant shortness of breath or any chest pain, please seek ER evaluation. Please do not delay care!   COVID-19: What to Do if You Are Sick If you test positive and are an older adult or someone who is at high risk of getting very sick from COVID-19, treatment may be available. Contact a healthcare provider right away after a positive test to determine if you are eligible, even if your symptoms are mild right now. You can also visit a Test to Treat location  and, if eligible, receive a prescription from a provider. Don't delay: Treatment must be started within the first few days to be effective. If you have a fever, cough, or other symptoms, you might have COVID-19. Most people have mild illness and are able to recover at home. If you are sick: Keep track of your symptoms. If you have an emergency warning sign (including trouble breathing), call 911. Steps to help prevent the spread of COVID-19 if you are sick If you are sick with COVID-19 or think you might have COVID-19, follow the steps below to care for yourself and to help protect other people in your home and community. Stay home except to get medical care Stay home. Most people with COVID-19 have mild illness and can recover at home without medical care. Do not leave your home, except to get medical care. Do not visit public areas and do not go to places where you are unable to wear a mask. Take care of yourself. Get rest and stay hydrated. Take over-the-counter medicines, such as acetaminophen , to help you feel better. Stay in touch with your doctor. Call before you get medical care. Be sure to get care if you have trouble breathing, or have any other emergency warning signs, or if you think it is an emergency. Avoid public transportation, ride-sharing, or taxis if possible. Get tested If you have symptoms of COVID-19, get tested. While waiting for test results, stay away from others, including staying apart from those living in your household. Get tested as soon as possible after your symptoms start. Treatments may be available for people with COVID-19 who are at risk for becoming very sick. Don't delay: Treatment must be started early to be effective--some treatments must begin within 5 days of your first symptoms. Contact your healthcare provider right away if your test result is positive to determine if you are eligible. Self-tests are one of several options for testing for the virus that  causes COVID-19 and may be more convenient than laboratory-based tests and point-of-care tests. Ask your healthcare provider or your local health department if you need help interpreting your test results. You can visit your state, tribal, local, and territorial health department's website to look for the latest local information on testing sites. Separate yourself from other people As much as possible, stay in a specific room and away from other people and pets in your home. If possible, you should use a separate bathroom. If you need to be around other people or animals in or outside of the home, wear a well-fitting mask. Tell your close contacts that they may have been exposed to COVID-19. An infected person can spread COVID-19 starting 48 hours (or 2 days) before the person has any symptoms or tests positive. By letting your close contacts know they may have been exposed to COVID-19, you are helping to protect everyone. See COVID-19 and Animals if you have questions about pets. If you are diagnosed with  COVID-19, someone from the health department may call you. Answer the call to slow the spread. Monitor your symptoms Symptoms of COVID-19 include fever, cough, or other symptoms. Follow care instructions from your healthcare provider and local health department. Your local health authorities may give instructions on checking your symptoms and reporting information. When to seek emergency medical attention Look for emergency warning signs* for COVID-19. If someone is showing any of these signs, seek emergency medical care immediately: Trouble breathing Persistent pain or pressure in the chest New confusion Inability to wake or stay awake Pale, gray, or blue-colored skin, lips, or nail beds, depending on skin tone *This list is not all possible symptoms. Please call your medical provider for any other symptoms that are severe or concerning to you. Call 911 or call ahead to your local emergency  facility: Notify the operator that you are seeking care for someone who has or may have COVID-19. Call ahead before visiting your doctor Call ahead. Many medical visits for routine care are being postponed or done by phone or telemedicine. If you have a medical appointment that cannot be postponed, call your doctor's office, and tell them you have or may have COVID-19. This will help the office protect themselves and other patients. If you are sick, wear a well-fitting mask You should wear a mask if you must be around other people or animals, including pets (even at home). Wear a mask with the best fit, protection, and comfort for you. You don't need to wear the mask if you are alone. If you can't put on a mask (because of trouble breathing, for example), cover your coughs and sneezes in some other way. Try to stay at least 6 feet away from other people. This will help protect the people around you. Masks should not be placed on young children under age 48 years, anyone who has trouble breathing, or anyone who is not able to remove the mask without help. Cover your coughs and sneezes Cover your mouth and nose with a tissue when you cough or sneeze. Throw away used tissues in a lined trash can. Immediately wash your hands with soap and water for at least 20 seconds. If soap and water are not available, clean your hands with an alcohol-based hand sanitizer that contains at least 60% alcohol. Clean your hands often Wash your hands often with soap and water for at least 20 seconds. This is especially important after blowing your nose, coughing, or sneezing; going to the bathroom; and before eating or preparing food. Use hand sanitizer if soap and water are not available. Use an alcohol-based hand sanitizer with at least 60% alcohol, covering all surfaces of your hands and rubbing them together until they feel dry. Soap and water are the best option, especially if hands are visibly dirty. Avoid touching  your eyes, nose, and mouth with unwashed hands. Handwashing Tips Avoid sharing personal household items Do not share dishes, drinking glasses, cups, eating utensils, towels, or bedding with other people in your home. Wash these items thoroughly after using them with soap and water or put in the dishwasher. Clean surfaces in your home regularly Clean and disinfect high-touch surfaces (for example, doorknobs, tables, handles, light switches, and countertops) in your "sick room" and bathroom. In shared spaces, you should clean and disinfect surfaces and items after each use by the person who is ill. If you are sick and cannot clean, a caregiver or other person should only clean and disinfect the area around you (  such as your bedroom and bathroom) on an as needed basis. Your caregiver/other person should wait as long as possible (at least several hours) and wear a mask before entering, cleaning, and disinfecting shared spaces that you use. Clean and disinfect areas that may have blood, stool, or body fluids on them. Use household cleaners and disinfectants. Clean visible dirty surfaces with household cleaners containing soap or detergent. Then, use a household disinfectant. Use a product from [employer] List N: Disinfectants for Coronavirus (COVID-19). Be sure to follow the instructions on the label to ensure safe and effective use of the product. Many products recommend keeping the surface wet with a disinfectant for a certain period of time (look at "contact time" on the product label). You may also need to wear personal protective equipment, such as gloves, depending on the directions on the product label. Immediately after disinfecting, wash your hands with soap and water for 20 seconds. For completed guidance on cleaning and disinfecting your home, visit Complete Disinfection Guidance. Take steps to improve ventilation at home Improve ventilation (air flow) at home to help prevent from spreading COVID-19  to other people in your household. Clear out COVID-19 virus particles in the air by opening windows, using air filters, and turning on fans in your home. Use this interactive tool to learn how to improve air flow in your home. When you can be around others after being sick with COVID-19 Deciding when you can be around others is different for different situations. Find out when you can safely end home isolation. For any additional questions about your care, contact your healthcare provider or state or local health department. 03/27/2020 Content source: [facility] ([facility]), Division of Viral Diseases This information is not intended to replace advice given to you by your health care provider. Make sure you discuss any questions you have with your health care provider. Document Revised: 05/10/2020 Document Reviewed: 05/10/2020 Elsevier Patient Education  2022 [employer].     If you have been instructed to have an in-person evaluation today at a local Urgent Care facility, please use the link below. It will take you to a list of all of our available [facility], including address, phone number and hours of operation. Please do not delay care.  [facility]  If you or a family member do not have a primary care provider, use the link below to schedule a visit and establish care. When you choose a [facility] primary care physician or advanced practice provider, you gain a long-term partner in health. Find a Primary Care Provider  Learn more about [facility]'s in-office and virtual care options:  - Get Care Now

## 2023-05-11 NOTE — Progress Notes (Signed)
 Virtual Visit Consent   Michael Durham, you are scheduled for a virtual visit with a [facility] provider today. Just as with appointments in the office, your consent must be obtained to participate. Your consent will be active for this visit and any virtual visit you may have with one of our providers in the next 365 days. If you have a MyChart account, a copy of this consent can be sent to you electronically.  As this is a virtual visit, video technology does not allow for your provider to perform a traditional examination. This may limit your provider's ability to fully assess your condition. If your provider identifies any concerns that need to be evaluated in person or the need to arrange testing (such as labs, EKG, etc.), we will make arrangements to do so. Although advances in technology are sophisticated, we cannot ensure that it will always work on either your end or our end. If the connection with a video visit is poor, the visit may have to be switched to a telephone visit. With either a video or telephone visit, we are not always able to ensure that we have a secure connection.  By engaging in this virtual visit, you consent to the provision of healthcare and authorize for your insurance to be billed (if applicable) for the services provided during this visit. Depending on your insurance coverage, you may receive a charge related to this service.  I need to obtain your verbal consent now. Are you willing to proceed with your visit today? Gurjit Emric Nicolo has provided verbal consent on 05/11/2023 for a virtual visit (video or telephone). Lanetta Pion, NP  Date: 05/11/2023 9:01 AM   Virtual Visit via Video Note   I, Lanetta Pion, connected with  Darryl Arnhold  ([id number], [birth year]) on 05/11/23 at  9:00 AM EDT by a video-enabled telemedicine application and verified that I am speaking with the correct person using two identifiers.  Location: Patient: Virtual Visit Location  Patient: Home Provider: Virtual Visit Location Provider: Home Office   I discussed the limitations of evaluation and management by telemedicine and the availability of in person appointments. The patient expressed understanding and agreed to proceed.    History of Present Illness: Michael Durham is a 68 y.o. who identifies as a male who was assigned male at birth, and is being seen today for COVID.  Onset was yesterday (was at the beach last week) was doing ok until yesterday afternoon. Runny nose, body aches, vomiting x once, fever (100.4) Tested for COVID and it was positive- and wife was + as well last Thursday.  Feeling better today fatigue, some lightlessness, some soreness, no appetite- ate an english muffin.   Has taken ibuprofen 400 mg and then 3 hours later and two tylenol  and repeated as needed. Denies chest pain, shortness of breath, no current fever.     Problems:  Patient Active Problem List   Diagnosis Date Noted   Colon cancer screening 04/09/2019   Hx of colonic polyps 04/09/2019   Diverticulosis 08/03/2018   Hx of colonic polyp - last colonoscopy with Dr. Carmela Chin on 12/21/13 08/03/2018   Vitamin D  deficiency 11/12/2016   Recurrent major depressive episodes, moderate (HCC) 11/12/2016   HLD (hyperlipidemia)    ADHD    ED (erectile dysfunction)    Bipolar 2 disorder (HCC)     Allergies:  Allergies  Allergen Reactions   Wellbutrin [Bupropion] Other (See Comments)    Severe headache.  Tetanus Toxoid Other (See Comments)    Reaction: Fever (intolerance); "old horse serum preparation"   Tetanus Toxoids     "old horse serum preparation"   Medications:  Current Outpatient Medications:    amphetamine -dextroamphetamine  (ADDERALL) 20 MG tablet, Take 1 tablet (20 mg total) by mouth 2 (two) times daily., Disp: 60 tablet, Rfl: 0   amphetamine -dextroamphetamine  (ADDERALL) 20 MG tablet, Take 1 tablet (20 mg total) by mouth 2 (two) times daily., Disp: 60 tablet, Rfl: 0    amphetamine -dextroamphetamine  (ADDERALL) 20 MG tablet, Take 1 tablet (20 mg total) by mouth 2 (two) times daily., Disp: 60 tablet, Rfl: 0   ARIPiprazole  (ABILIFY ) 5 MG tablet, Take 1 tablet (5 mg total) by mouth at bedtime., Disp: 90 tablet, Rfl: 3   aspirin  EC 81 MG tablet, Take 1 tablet (81 mg total) by mouth daily. Swallow whole., Disp: 90 tablet, Rfl: 3   Cholecalciferol (VITAMIN D ) 2000 units CAPS, Take 2,000 Units by mouth daily., Disp: , Rfl:    escitalopram  (LEXAPRO ) 20 MG tablet, Take 1 tablet by mouth once daily, Disp: 90 tablet, Rfl: 3   latanoprost (XALATAN) 0.005 % ophthalmic solution, INSTILL 1 DROP INTO EACH EYE AT BEDTIME, Disp: , Rfl:    Omega-3 Fatty Acids (FISH OIL) 1200 MG CAPS, Take 1 capsule by mouth daily at 12 noon., Disp: , Rfl:    rosuvastatin  (CRESTOR ) 10 MG tablet, Take 1 tablet (10 mg total) by mouth 2 (two) times a week. (Patient not taking: Reported on 02/27/2023), Disp: 90 tablet, Rfl: 3   tadalafil  (CIALIS ) 5 MG tablet, 1-3 tabs po daily as needed., Disp: 30 tablet, Rfl: 5   tamsulosin  (FLOMAX ) 0.4 MG CAPS capsule, Take 1 capsule (0.4 mg total) by mouth daily., Disp: 90 capsule, Rfl: 3  Observations/Objective: Patient is well-developed, well-nourished in no acute distress.  Resting comfortably  at home.  Head is normocephalic, atraumatic.  No labored breathing.  Speech is clear and coherent with logical content.  Patient is alert and oriented at baseline.    Assessment and Plan:  1. COVID (Primary)   - Increased rest - Increasing Fluids - Acetaminophen  / ibuprofen as needed for fever/pain.  - Salt water gargling, chloraseptic spray and throat lozenges -Claritin and nasal spray daily  - Saline nasal spray if congestion or if nasal passages feel dry. - Humidifying the air.     2. Educated about COVID-19 virus infection  -on avs    Reviewed side effects, risks and benefits of medication.    Patient acknowledged agreement and understanding of the  plan.   Past Medical, Surgical, Social History, Allergies, and Medications have been Reviewed.   Follow Up Instructions: I discussed the assessment and treatment plan with the patient. The patient was provided an opportunity to ask questions and all were answered. The patient agreed with the plan and demonstrated an understanding of the instructions.  A copy of instructions were sent to the patient via MyChart unless otherwise noted below.    The patient was advised to call back or seek an in-person evaluation if the symptoms worsen or if the condition fails to improve as anticipated.    Lanetta Pion, NP

## 2023-05-18 ENCOUNTER — Encounter: Payer: Self-pay | Admitting: Family Medicine

## 2023-05-18 ENCOUNTER — Ambulatory Visit (INDEPENDENT_AMBULATORY_CARE_PROVIDER_SITE_OTHER): Payer: Medicare Other | Admitting: Family Medicine

## 2023-05-18 VITALS — BP 136/82 | HR 68 | Temp 97.6°F | Ht 69.0 in | Wt 195.2 lb

## 2023-05-18 DIAGNOSIS — F902 Attention-deficit hyperactivity disorder, combined type: Secondary | ICD-10-CM | POA: Diagnosis not present

## 2023-05-18 DIAGNOSIS — J302 Other seasonal allergic rhinitis: Secondary | ICD-10-CM

## 2023-05-18 DIAGNOSIS — N529 Male erectile dysfunction, unspecified: Secondary | ICD-10-CM

## 2023-05-18 DIAGNOSIS — E782 Mixed hyperlipidemia: Secondary | ICD-10-CM

## 2023-05-18 DIAGNOSIS — Z125 Encounter for screening for malignant neoplasm of prostate: Secondary | ICD-10-CM

## 2023-05-18 DIAGNOSIS — Z87891 Personal history of nicotine dependence: Secondary | ICD-10-CM

## 2023-05-18 DIAGNOSIS — N401 Enlarged prostate with lower urinary tract symptoms: Secondary | ICD-10-CM

## 2023-05-18 DIAGNOSIS — R351 Nocturia: Secondary | ICD-10-CM

## 2023-05-18 DIAGNOSIS — Z0189 Encounter for other specified special examinations: Secondary | ICD-10-CM

## 2023-05-18 DIAGNOSIS — F3181 Bipolar II disorder: Secondary | ICD-10-CM

## 2023-05-18 MED ORDER — AMPHETAMINE-DEXTROAMPHETAMINE 20 MG PO TABS: 20.0000 mg | ORAL_TABLET | Freq: Two times a day (BID) | ORAL | 0 refills | Status: DC

## 2023-05-18 MED ORDER — ARIPIPRAZOLE 5 MG PO TABS: 5.0000 mg | ORAL_TABLET | Freq: Every day | ORAL | 3 refills | Status: AC

## 2023-05-18 MED ORDER — AMPHETAMINE-DEXTROAMPHETAMINE 20 MG PO TABS
20.0000 mg | ORAL_TABLET | Freq: Two times a day (BID) | ORAL | 0 refills | Status: DC
Start: 2023-05-18 — End: 2023-05-18

## 2023-05-18 MED ORDER — TADALAFIL 5 MG PO TABS: ORAL_TABLET | ORAL | 5 refills | Status: AC

## 2023-05-18 MED ORDER — ESCITALOPRAM OXALATE 20 MG PO TABS: ORAL_TABLET | ORAL | 3 refills | Status: AC

## 2023-05-18 MED ORDER — TAMSULOSIN HCL 0.4 MG PO CAPS: 0.4000 mg | ORAL_CAPSULE | Freq: Every day | ORAL | 3 refills | Status: AC

## 2023-05-18 NOTE — Progress Notes (Signed)
 Established Patient Office Visit   Subjective  Patient ID: Michael Durham, male    DOB: 07-07-1955  Age: 68 y.o. MRN: [id number]  Chief Complaint  Patient presents with   Medical Management of Chronic Issues    Medication refill     Patient is a 68 year old male seen for follow-up.  Patient states he is doing well overall.  Had COVID 1 week ago.  Feeling better.  Patient inquires about low-dose CT scan given history of smoking.  Quit 20 years ago.  Smoked for 30 years 1 to 1.5 packs/day.  Patient denies coughing, SOB unless going up stairs.  Patient inquires if he needs an MMR vaccine.  Patient also inquires about allergy testing as his brother recently became severely allergic to gluten and poison ivy.  Patient endorses a history of seasonal allergies.  Worsened by deviated septum.  Seen by ENT.  Endorses waking up in the morning feeling congested having to blow nose frequently.  Patient requesting refills on medications.  States doing well on Adderall 20 mg twice daily.  Wishes to wait on AWV until next follow-up in 3 months.    Patient Active Problem List   Diagnosis Date Noted   Colon cancer screening 04/09/2019   Hx of colonic polyps 04/09/2019   Diverticulosis 08/03/2018   Hx of colonic polyp - last colonoscopy with Dr. Carmela Chin on 12/21/13 08/03/2018   Vitamin D  deficiency 11/12/2016   Recurrent major depressive episodes, moderate (HCC) 11/12/2016   HLD (hyperlipidemia)    ADHD    ED (erectile dysfunction)    Bipolar 2 disorder (HCC)    Past Medical History:  Diagnosis Date   ADHD    Allergy    Anxiety    Bipolar 2 disorder (HCC)    Depression    Diverticulosis    ED (erectile dysfunction)    Encephalitis, Age 72    HLD (hyperlipidemia)    Hx of colonic polyp - last colonoscopy with Dr. Carmela Chin on 12/21/13 08/03/2018   Vitamin D  deficiency    Past Surgical History:  Procedure Laterality Date   BRAIN SURGERY     COLONOSCOPY     CYST EXCISION Right 06/14/2021    Procedure: EXCISION RETINACULAR CYST RIGHT MIDDLE FINGER;  Surgeon: Brunilda Capra, MD;  Location: [facility];  Service: Orthopedics;  Laterality: Right;  30 MIN   MOUTH SURGERY     TOE SURGERY Left    L big toe   TRIGGER FINGER RELEASE Right 06/14/2021   Procedure: A1 PULLEY RELEASE RIGHT MIDDLE FINGER;  Surgeon: Brunilda Capra, MD;  Location: [facility];  Service: Orthopedics;  Laterality: Right;   Social History   Tobacco Use   Smoking status: Former   Smokeless tobacco: Never   Tobacco comments:    Quit > 15 years ago, smoked 1-2 ppd for 30 years  Vaping Use   Vaping status: Never Used  Substance Use Topics   Alcohol use: Yes    Comment: occasionally   Drug use: No   Family History  Problem Relation Age of Onset   Dementia Mother    Prostate cancer Father    Bladder Cancer Father    Diabetes Brother    Breast cancer Maternal Aunt    Esophageal cancer Neg Hx    Stomach cancer Neg Hx    Pancreatic cancer Neg Hx    Inflammatory bowel disease Neg Hx    Liver disease Neg Hx    Rectal cancer Neg Hx  Colon cancer Neg Hx    Allergies  Allergen Reactions   Wellbutrin [Bupropion] Other (See Comments)    Severe headache.   Tetanus Toxoid Other (See Comments)    Reaction: Fever (intolerance); "old horse serum preparation"   Tetanus Toxoids     "old horse serum preparation"      ROS Negative unless stated above    Objective:      BP 136/82 (BP Location: Left Arm, Patient Position: Sitting, Cuff Size: Normal)   Pulse 68   Temp 97.6 F (36.4 C) (Oral)   Ht 5\' 9"  (1.753 m)   Wt 195 lb 3.2 oz (88.5 kg)   SpO2 97%   BMI 28.83 kg/m  BP Readings from Last 3 Encounters:  05/18/23 136/82  02/18/23 (!) 140/74  11/07/22 138/74   Wt Readings from Last 3 Encounters:  05/18/23 195 lb 3.2 oz (88.5 kg)  02/18/23 191 lb 6.4 oz (86.8 kg)  11/07/22 190 lb 9.6 oz (86.5 kg)      Physical Exam Constitutional:      General: He is not in acute  distress.    Appearance: Normal appearance.  HENT:     Head: Normocephalic and atraumatic.     Nose: Septal deviation present.     Mouth/Throat:     Mouth: Mucous membranes are moist.  Cardiovascular:     Rate and Rhythm: Normal rate and regular rhythm.     Heart sounds: Normal heart sounds. No murmur heard.    No gallop.  Pulmonary:     Effort: Pulmonary effort is normal. No respiratory distress.     Breath sounds: Normal breath sounds. No wheezing, rhonchi or rales.  Skin:    General: Skin is warm and dry.  Neurological:     Mental Status: He is alert and oriented to person, place, and time.        05/18/2023    9:41 AM 05/18/2023    9:40 AM 02/18/2023    9:06 AM  Depression screen PHQ 2/9  Decreased Interest  1 2  Down, Depressed, Hopeless  1 1  PHQ - 2 Score  2 3  Altered sleeping  1 1  Tired, decreased energy   1  Change in appetite  0 0  Feeling bad or failure about yourself   0 0  Trouble concentrating  1 1  Moving slowly or fidgety/restless  0 0  Suicidal thoughts  0 0  PHQ-9 Score  4 6  Difficult doing work/chores Somewhat difficult        05/18/2023    9:41 AM 02/18/2023    9:06 AM 11/07/2022   10:08 AM 08/08/2022    9:30 AM  GAD 7 : Generalized Anxiety Score  Nervous, Anxious, on Edge 0 0 0 0  Control/stop worrying 0 0 1 0  Worry too much - different things 0 0 1 0  Trouble relaxing 0 0 0 0  Restless 0 1 3 1   Easily annoyed or irritable 0 1 0 2  Afraid - awful might happen 0 0 1 0  Total GAD 7 Score 0 2 6 3   Anxiety Difficulty Not difficult at all Not difficult at all Not difficult at all Somewhat difficult     No results found for any visits on 05/18/23.    Assessment & Plan:  Bipolar 2 disorder (HCC) -     ARIPiprazole ; Take 1 tablet (5 mg total) by mouth at bedtime.  Dispense: 90 tablet; Refill: 3 -  Escitalopram  Oxalate; Take 1 tablet by mouth once daily  Dispense: 90 tablet; Refill: 3 -     TSH; Future -     T4, free; Future  Attention  deficit hyperactivity disorder (ADHD), combined type -     Amphetamine -Dextroamphetamine ; Take 1 tablet (20 mg total) by mouth 2 (two) times daily.  Dispense: 60 tablet; Refill: 0 -     Amphetamine -Dextroamphetamine ; Take 1 tablet (20 mg total) by mouth 2 (two) times daily.  Dispense: 60 tablet; Refill: 0 -     Amphetamine -Dextroamphetamine ; Take 1 tablet (20 mg total) by mouth 2 (two) times daily.  Dispense: 60 tablet; Refill: 0  Mixed hyperlipidemia -     Lipid panel; Future -     Comprehensive metabolic panel with GFR; Future  Erectile dysfunction, unspecified erectile dysfunction type -     Tadalafil ; 1-3 tabs po daily as needed.  Dispense: 30 tablet; Refill: 5 -     CBC with Differential/Platelet; Future  Former smoker -     CT CHEST LUNG CANCER SCREENING LOW DOSE WO CONTRAST; Future -     CBC with Differential/Platelet; Future  Seasonal allergies -     Ambulatory referral to Allergy -     CBC with Differential/Platelet; Future  Patient requested diagnostic testing -     Measles/Mumps/Rubella Immunity  Benign prostatic hyperplasia with nocturia -     Tamsulosin  HCl; Take 1 capsule (0.4 mg total) by mouth daily.  Dispense: 90 capsule; Refill: 3 -     PSA, Medicare; Future  Screening for prostate cancer -     PSA, Medicare; Future  Patient seen for follow-up.  History of bipolar 2 disorder stable.  Continue Abilify  5 mg, Lexapro  20 mg.  Adderall 20 mg twice daily refilled for history of ADHD.  PDMP reviewed and appropriate.  Low-dose CT for lung cancer screening ordered given 30-45-pack-year history, quit 20 years ago.  Referral to allergist for allergy testing.  Labs ordered.  Schedule AWV with this provider or wellness nurse in 3 months.  Return in about 3 months (around 08/18/2023) for chronic conditions, AWV.   Viola Greulich, MD

## 2023-05-21 ENCOUNTER — Telehealth: Payer: Self-pay

## 2023-05-21 NOTE — Telephone Encounter (Addendum)
 Copied from CRM [id number]. Topic: General - Other >> May 21, 2023 10:40 AM Baldo Levan wrote: Reason for CRM: Gurney Lefort from [facility] called in requested a new imagining order it is currently placed as a CT lung cancer screening order but the patient does not qualify, she is asking this be placed as a CT chest order.

## 2023-05-27 NOTE — Telephone Encounter (Signed)
Called patient and left a message to return call.

## 2023-05-27 NOTE — Telephone Encounter (Signed)
 Will you inquire if patient would like to proceed with CT scan as insurance may not cover the cost completely since he quit smoking over 15 years ago.

## 2023-06-03 ENCOUNTER — Telehealth: Payer: Self-pay

## 2023-06-03 NOTE — Telephone Encounter (Signed)
 Spoke with patient, patient is going to call insurance to see much they will cover.

## 2023-06-03 NOTE — Telephone Encounter (Signed)
 Copied from CRM [id number]. Topic: General - Other >> Jun 03, 2023 10:03 AM Albertha Alosa wrote: Reason for CRM: Patient called in regarding a missed call from [facility], would like for her to give him a callback

## 2023-06-03 NOTE — Telephone Encounter (Signed)
 See other note

## 2023-06-18 ENCOUNTER — Ambulatory Visit (INDEPENDENT_AMBULATORY_CARE_PROVIDER_SITE_OTHER): Admitting: Otolaryngology

## 2023-06-18 VITALS — BP 157/84 | HR 78 | Ht 69.0 in | Wt 195.0 lb

## 2023-06-18 DIAGNOSIS — J3489 Other specified disorders of nose and nasal sinuses: Secondary | ICD-10-CM | POA: Diagnosis not present

## 2023-06-18 DIAGNOSIS — J343 Hypertrophy of nasal turbinates: Secondary | ICD-10-CM

## 2023-06-18 DIAGNOSIS — J3089 Other allergic rhinitis: Secondary | ICD-10-CM

## 2023-06-18 DIAGNOSIS — Z87891 Personal history of nicotine dependence: Secondary | ICD-10-CM

## 2023-06-18 DIAGNOSIS — R0981 Nasal congestion: Secondary | ICD-10-CM

## 2023-06-18 DIAGNOSIS — R0982 Postnasal drip: Secondary | ICD-10-CM

## 2023-06-18 DIAGNOSIS — J342 Deviated nasal septum: Secondary | ICD-10-CM

## 2023-06-18 MED ORDER — LEVOCETIRIZINE DIHYDROCHLORIDE 5 MG PO TABS: 5.0000 mg | ORAL_TABLET | Freq: Every evening | ORAL | 3 refills | Status: DC

## 2023-06-18 MED ORDER — FLUTICASONE PROPIONATE 50 MCG/ACT NA SUSP: 2.0000 | Freq: Every day | NASAL | 6 refills | Status: AC

## 2023-06-18 NOTE — Progress Notes (Signed)
 ENT CONSULT:  Reason for Consult: chronic nasal congestion and nasal obstruction    HPI: Discussed the use of AI scribe software for clinical note transcription with the patient, who gave verbal consent to proceed.  History of Present Illness  Discussed the use of AI scribe software for clinical note transcription with the patient, who gave verbal consent to proceed.  History of Present Illness   Michael Durham is a 68 year old male who presents with chronic nasal congestion and difficulty breathing through the nose.   He experiences difficulty breathing through his nose, with the left side occasionally becoming clogged and the right side completely obstructed for inhalation, though he can exhale through it. This issue has persisted despite a previous surgery for a deviated septum approximately 50 years ago, which did not alleviate his symptoms. A surgeon 20 years ago suggested that the initial surgery may have been inadequate.  He has not been using any nasal sprays or allergy medications and has not been tested for allergies. He often sees yellow mucus when he first blows his nose, although it was not yellow during the current visit. No current symptoms of sinus infection.        Past Medical History:  Diagnosis Date   ADHD    Allergy    Anxiety    Bipolar 2 disorder (HCC)    Depression    Diverticulosis    ED (erectile dysfunction)    Encephalitis, Age 55    HLD (hyperlipidemia)    Hx of colonic polyp - last colonoscopy with Dr. Carmela Chin on 12/21/13 08/03/2018   Vitamin D  deficiency     Past Surgical History:  Procedure Laterality Date   BRAIN SURGERY     COLONOSCOPY     CYST EXCISION Right 06/14/2021   Procedure: EXCISION RETINACULAR CYST RIGHT MIDDLE FINGER;  Surgeon: Brunilda Capra, MD;  Location: [facility];  Service: Orthopedics;  Laterality: Right;  30 MIN   MOUTH SURGERY     TOE SURGERY Left    L big toe   TRIGGER FINGER RELEASE Right 06/14/2021    Procedure: A1 PULLEY RELEASE RIGHT MIDDLE FINGER;  Surgeon: Brunilda Capra, MD;  Location: [facility];  Service: Orthopedics;  Laterality: Right;    Family History  Problem Relation Age of Onset   Dementia Mother    Prostate cancer Father    Bladder Cancer Father    Diabetes Brother    Breast cancer Maternal Aunt    Esophageal cancer Neg Hx    Stomach cancer Neg Hx    Pancreatic cancer Neg Hx    Inflammatory bowel disease Neg Hx    Liver disease Neg Hx    Rectal cancer Neg Hx    Colon cancer Neg Hx     Social History:  reports that he has quit smoking. He has never used smokeless tobacco. He reports current alcohol use. He reports that he does not use drugs.  Allergies:  Allergies  Allergen Reactions   Wellbutrin [Bupropion] Other (See Comments)    Severe headache.   Tetanus Toxoid Other (See Comments)    Reaction: Fever (intolerance); old horse serum preparation   Tetanus Toxoids     old horse serum preparation    Medications: I have reviewed the patient's current medications.  The PMH, PSH, Medications, Allergies, and SH were reviewed and updated.  ROS: Constitutional: Negative for fever, weight loss and weight gain. Cardiovascular: Negative for chest pain and dyspnea on exertion. Respiratory:  Is not experiencing shortness of breath at rest. Gastrointestinal: Negative for nausea and vomiting. Neurological: Negative for headaches. Psychiatric: The patient is not nervous/anxious  Blood pressure (!) 157/84, pulse 78, height 5' 9 (1.753 m), weight 195 lb (88.5 kg), SpO2 96%. Body mass index is 28.8 kg/m.  PHYSICAL EXAM:  Exam: General: Well-developed, well-nourished Respiratory Respiratory effort: Equal inspiration and expiration without stridor Cardiovascular Peripheral Vascular: Warm extremities with equal color/perfusion Eyes: No nystagmus with equal extraocular motion bilaterally Neuro/Psych/Balance: Patient oriented to person, place, and  time; Appropriate mood and affect; Gait is intact with no imbalance; Cranial nerves I-XII are intact Head and Face Inspection: Normocephalic and atraumatic without mass or lesion Palpation: Facial skeleton intact without bony stepoffs Salivary Glands: No mass or tenderness Facial Strength: Facial motility symmetric and full bilaterally ENT Pinna: External ear intact and fully developed External canal: Canal is patent with intact skin Tympanic Membrane: Clear and mobile External Nose: No scar or anatomic deformity Internal Nose: Septum is with caudal spur on the right some curvature to the left posteriorly. No polyp, or purulence. Mucosal edema and erythema present.  Bilateral inferior turbinate hypertrophy.  Lips, Teeth, and gums: Mucosa and teeth intact and viable TMJ: No pain to palpation with full mobility Oral cavity/oropharynx: No erythema or exudate, no lesions present Nasopharynx: No mass or lesion with intact mucosa Neck Neck and Trachea: Midline trachea without mass or lesion Thyroid : No mass or nodularity Lymphatics: No lymphadenopathy  Procedure:   PROCEDURE NOTE: nasal endoscopy  Preoperative diagnosis: chronic nasal congestion symptoms  Postoperative diagnosis: same  Procedure: Diagnostic nasal endoscopy (04540)  Surgeon: Artice Last, M.D.  Anesthesia: Topical lidocaine  and Afrin  H&P REVIEW: The patient's history and physical were reviewed today prior to procedure. All medications were reviewed and updated as well. Complications: None Condition is stable throughout exam Indications and consent: The patient presents with symptoms of chronic sinusitis not responding to previous therapies. All the risks, benefits, and potential complications were reviewed with the patient preoperatively and informed consent was obtained. The time out was completed with confirmation of the correct procedure.   Procedure: The patient was seated upright in the clinic. Topical  lidocaine  and Afrin were applied to the nasal cavity. After adequate anesthesia had occurred, the rigid nasal endoscope was passed into the nasal cavity. The nasal mucosa, turbinates, septum, and sinus drainage pathways were visualized bilaterally. This revealed no purulence or significant secretions that might be cultured. There were no polyps or sites of significant inflammation. The mucosa was intact and there was no crusting present. The scope was then slowly withdrawn and the patient tolerated the procedure well. There were no complications or blood loss.   Assessment/Plan: Encounter Diagnoses  Name Primary?   Nasal obstruction    Environmental and seasonal allergies    Chronic nasal congestion Yes   Hypertrophy of both inferior nasal turbinates    Nasal septal deviation     Assessment and Plan Assessment & Plan    Assessment and Plan    Chronic Nasal congestion and obstruction  Hx of septoplasty, residual septal deviation but he does have patent nasal passages L > R. No polyps or purulence on nasal endoscopy today  - Recommend nasal saline irrigation with distilled water and salt packets.  - Prescribed Flonase twice daily. - Prescribed Xyzal for allergy management. - Advised to consider allergy shots if a candidate after allergy testing. He has testing scheduled already - Proceed with allergy workup.  Thank you for allowing me to participate in the care of this patient. Please do not hesitate to contact me with any questions or concerns.   Artice Last, MD Otolaryngology [facility] Phone: [phone] Fax: [phone]    06/18/2023, 11:28 AM

## 2023-06-18 NOTE — Patient Instructions (Signed)
 Michael Durham Med Nasal Saline Rinse   - start nasal saline rinses with [employer] available over the counter or online to help with nasal congestion

## 2023-07-09 ENCOUNTER — Ambulatory Visit: Admitting: Family Medicine

## 2023-07-12 NOTE — Progress Notes (Unsigned)
 New Patient Note  RE: Michael Durham MRN: [id number] DOB: Mar 27, 1955 Date of Office Visit: 07/13/2023  Consult requested by: Mercer Clotilda SAUNDERS, MD Primary care provider: Mercer Clotilda SAUNDERS, MD  Chief Complaint: No chief complaint on file.  History of Present Illness: I had the pleasure of seeing Michael Durham for initial evaluation at the [facility] on 07/13/2023. He is a 68 y.o. male, who is referred here by Mercer Clotilda SAUNDERS, MD for the evaluation of seasonal allergies.  Discussed the use of AI scribe software for clinical note transcription with the patient, who gave verbal consent to proceed.  History of Present Illness             He reports symptoms of ***. Symptoms have been going on for *** years. The symptoms are present *** all year around with worsening in ***. Other triggers include exposure to ***. Anosmia: ***. Headache: ***. He has used *** with ***fair improvement in symptoms. Sinus infections: ***. Previous work up includes: ***. Previous ENT evaluation: ***. Previous sinus imaging: ***. History of nasal polyps: ***. Last eye exam: ***. History of reflux: ***.  Assessment and Plan: Michael Durham is a 68 y.o. male with: ***  Assessment and Plan               No follow-ups on file.  No orders of the defined types were placed in this encounter.  Lab Orders  No laboratory test(s) ordered today    Other allergy screening: Asthma: {Blank single:19197::yes,no} Rhino conjunctivitis: {Blank single:19197::yes,no} Food allergy: {Blank single:19197::yes,no} Medication allergy: {Blank single:19197::yes,no} Hymenoptera allergy: {Blank single:19197::yes,no} Urticaria: {Blank single:19197::yes,no} Eczema:{Blank single:19197::yes,no} History of recurrent infections suggestive of immunodeficency: {Blank single:19197::yes,no}  Diagnostics: Spirometry:  Tracings reviewed. His effort: {Blank single:19197::Good  reproducible efforts.,It was hard to get consistent efforts and there is a question as to whether this reflects a maximal maneuver.,Poor effort, data can not be interpreted.} FVC: ***L FEV1: ***L, ***% predicted FEV1/FVC ratio: ***% Interpretation: {Blank single:19197::Spirometry consistent with mild obstructive disease,Spirometry consistent with moderate obstructive disease,Spirometry consistent with severe obstructive disease,Spirometry consistent with possible restrictive disease,Spirometry consistent with mixed obstructive and restrictive disease,Spirometry uninterpretable due to technique,Spirometry consistent with normal pattern,No overt abnormalities noted given today's efforts}.  Please see scanned spirometry results for details.  Skin Testing: {Blank single:19197::Select foods,Environmental allergy panel,Environmental allergy panel and select foods,Food allergy panel,None,Deferred due to recent antihistamines use}. *** Results discussed with patient/family.   Past Medical History: Patient Active Problem List   Diagnosis Date Noted  . Colon cancer screening 04/09/2019  . Hx of colonic polyps 04/09/2019  . Diverticulosis 08/03/2018  . Hx of colonic polyp - last colonoscopy with Dr. Nathanial on 12/21/13 08/03/2018  . Vitamin D  deficiency 11/12/2016  . Recurrent major depressive episodes, moderate (HCC) 11/12/2016  . HLD (hyperlipidemia)   . ADHD   . ED (erectile dysfunction)   . Bipolar 2 disorder [facility])    Past Medical History:  Diagnosis Date  . ADHD   . Allergy   . Anxiety   . Bipolar 2 disorder (HCC)   . Depression   . Diverticulosis   . ED (erectile dysfunction)   . Encephalitis, Age 22   . HLD (hyperlipidemia)   . Hx of colonic polyp - last colonoscopy with Dr. Nathanial on 12/21/13 08/03/2018  . Vitamin D  deficiency    Past Surgical History: Past Surgical History:  Procedure Laterality Date  . BRAIN SURGERY    . COLONOSCOPY    . CYST  EXCISION Right 06/14/2021   Procedure: EXCISION RETINACULAR CYST RIGHT MIDDLE FINGER;  Surgeon: Murrell Drivers, MD;  Location: [facility];  Service: Orthopedics;  Laterality: Right;  30 MIN  . MOUTH SURGERY    . TOE SURGERY Left    L big toe  . TRIGGER FINGER RELEASE Right 06/14/2021   Procedure: A1 PULLEY RELEASE RIGHT MIDDLE FINGER;  Surgeon: Murrell Drivers, MD;  Location: [facility];  Service: Orthopedics;  Laterality: Right;   Medication List:  Current Outpatient Medications  Medication Sig Dispense Refill  . amphetamine -dextroamphetamine  (ADDERALL) 20 MG tablet Take 1 tablet (20 mg total) by mouth 2 (two) times daily. 60 tablet 0  . amphetamine -dextroamphetamine  (ADDERALL) 20 MG tablet Take 1 tablet (20 mg total) by mouth 2 (two) times daily. 60 tablet 0  . amphetamine -dextroamphetamine  (ADDERALL) 20 MG tablet Take 1 tablet (20 mg total) by mouth 2 (two) times daily. 60 tablet 0  . ARIPiprazole  (ABILIFY ) 5 MG tablet Take 1 tablet (5 mg total) by mouth at bedtime. 90 tablet 3  . aspirin  EC 81 MG tablet Take 1 tablet (81 mg total) by mouth daily. Swallow whole. 90 tablet 3  . Cholecalciferol (VITAMIN D ) 2000 units CAPS Take 2,000 Units by mouth daily.    . escitalopram  (LEXAPRO ) 20 MG tablet Take 1 tablet by mouth once daily 90 tablet 3  . fluticasone  (FLONASE ) 50 MCG/ACT nasal spray Place 2 sprays into both nostrils daily. 16 g 6  . latanoprost (XALATAN) 0.005 % ophthalmic solution INSTILL 1 DROP INTO EACH EYE AT BEDTIME    . levocetirizine (XYZAL  ALLERGY 24HR) 5 MG tablet Take 1 tablet (5 mg total) by mouth every evening. 30 tablet 3  . Omega-3 Fatty Acids (FISH OIL) 1200 MG CAPS Take 1 capsule by mouth daily at 12 noon.    . rosuvastatin  (CRESTOR ) 10 MG tablet Take 1 tablet (10 mg total) by mouth 2 (two) times a week. 90 tablet 3  . tadalafil  (CIALIS ) 5 MG tablet 1-3 tabs po daily as needed. 30 tablet 5  . tamsulosin  (FLOMAX ) 0.4 MG CAPS capsule Take 1 capsule (0.4  mg total) by mouth daily. 90 capsule 3   No current facility-administered medications for this visit.   Allergies: Allergies  Allergen Reactions  . Wellbutrin [Bupropion] Other (See Comments)    Severe headache.  . Tetanus Toxoid Other (See Comments)    Reaction: Fever (intolerance); old horse serum preparation  . Tetanus Toxoids     old horse serum preparation   Social History: Social History   Socioeconomic History  . Marital status: Married    Spouse name: Not on file  . Number of children: Not on file  . Years of education: Not on file  . Highest education level: Some college, no degree  Occupational History  . Not on file  Tobacco Use  . Smoking status: Former  . Smokeless tobacco: Never  . Tobacco comments:    Quit > 15 years ago, smoked 1-2 ppd for 30 years  Vaping Use  . Vaping status: Never Used  Substance and Sexual Activity  . Alcohol use: Yes    Comment: occasionally  . Drug use: No  . Sexual activity: Yes    Partners: Female    Birth control/protection: None  Other Topics Concern  . Not on file  Social History Narrative  . Not on file   Social Drivers of Health   Financial Resource Strain: Low Risk  (02/16/2023)   Overall Financial  Resource Strain (CARDIA)   . Difficulty of Paying Living Expenses: Not very hard  Food Insecurity: Low Risk  (03/25/2023)   Received from [facility]   Hunger Vital Sign   . Within the past 12 months, you worried that your food would run out before you got money to buy more: Never true   . Within the past 12 months, the food you bought just didn't last and you didn't have money to get more. : Never true  Transportation Needs: No Transportation Needs (03/25/2023)   Received from [facility]   . In the past 12 months, has lack of reliable transportation kept you from medical appointments, meetings, work or from getting things needed for daily living? : No  Physical Activity: Insufficiently Active  (02/16/2023)   Exercise Vital Sign   . Days of Exercise per Week: 3 days   . Minutes of Exercise per Session: 20 min  Stress: No Stress Concern Present (02/16/2023)   [employer] - Occupational Stress Questionnaire   . Feeling of Stress : Only a little  Social Connections: Socially Isolated (02/16/2023)   Social Connection and Isolation Panel   . Frequency of Communication with Friends and Family: Once a week   . Frequency of Social Gatherings with Friends and Family: Once a week   . Attends Religious Services: Never   . Active Member of Clubs or Organizations: No   . Attends Banker Meetings: Not on file   . Marital Status: Married   Lives in a ***. Smoking: *** Occupation: ***  Environmental HistorySurveyor, minerals in the house: Network engineer in the family room: {Blank single:19197::yes,no} Carpet in the bedroom: {Blank single:19197::yes,no} Heating: {Blank single:19197::electric,gas,heat pump} Cooling: {Blank single:19197::central,window,heat pump} Pet: {Blank single:19197::yes ***,no}  Family History: Family History  Problem Relation Age of Onset  . Dementia Mother   . Prostate cancer Father   . Bladder Cancer Father   . Diabetes Brother   . Breast cancer Maternal Aunt   . Esophageal cancer Neg Hx   . Stomach cancer Neg Hx   . Pancreatic cancer Neg Hx   . Inflammatory bowel disease Neg Hx   . Liver disease Neg Hx   . Rectal cancer Neg Hx   . Colon cancer Neg Hx    Problem                               Relation Asthma                                   *** Eczema                                *** Food allergy                          *** Allergic rhino conjunctivitis     ***  Review of Systems  Constitutional:  Negative for appetite change, chills, fever and unexpected weight change.  HENT:  Negative for congestion and rhinorrhea.   Eyes:  Negative for itching.   Respiratory:  Negative for cough, chest tightness, shortness of breath and wheezing.   Cardiovascular:  Negative for chest pain.  Gastrointestinal:  Negative for abdominal pain.  Genitourinary:  Negative for difficulty urinating.  Skin:  Negative for rash.  Neurological:  Negative for headaches.    Objective: There were no vitals taken for this visit. There is no height or weight on file to calculate BMI. Physical Exam Vitals and nursing note reviewed.  Constitutional:      Appearance: Normal appearance. He is well-developed.  HENT:     Head: Normocephalic and atraumatic.     Right Ear: Tympanic membrane and external ear normal.     Left Ear: Tympanic membrane and external ear normal.     Nose: Nose normal.     Mouth/Throat:     Mouth: Mucous membranes are moist.     Pharynx: Oropharynx is clear.  Eyes:     Conjunctiva/sclera: Conjunctivae normal.  Cardiovascular:     Rate and Rhythm: Normal rate and regular rhythm.     Heart sounds: Normal heart sounds. No murmur heard.    No friction rub. No gallop.  Pulmonary:     Effort: Pulmonary effort is normal.     Breath sounds: Normal breath sounds. No wheezing, rhonchi or rales.  Musculoskeletal:     Cervical back: Neck supple.  Skin:    General: Skin is warm.     Findings: No rash.  Neurological:     Mental Status: He is alert and oriented to person, place, and time.  Psychiatric:        Behavior: Behavior normal.   The plan was reviewed with the patient/family, and all questions/concerned were addressed.  It was my pleasure to see Michael Durham today and participate in his care. Please feel free to contact me with any questions or concerns.  Sincerely,  Orlan Cramp, DO Allergy & Immunology  [facility]  [facility] office: [phone] [facility] office: [phone]

## 2023-07-13 ENCOUNTER — Encounter: Payer: Self-pay | Admitting: Allergy

## 2023-07-13 ENCOUNTER — Other Ambulatory Visit: Payer: Self-pay

## 2023-07-13 ENCOUNTER — Ambulatory Visit (INDEPENDENT_AMBULATORY_CARE_PROVIDER_SITE_OTHER): Admitting: Allergy

## 2023-07-13 VITALS — BP 138/80 | HR 71 | Temp 98.2°F | Resp 18 | Ht 68.0 in | Wt 193.2 lb

## 2023-07-13 DIAGNOSIS — J3089 Other allergic rhinitis: Secondary | ICD-10-CM | POA: Diagnosis not present

## 2023-07-13 DIAGNOSIS — R21 Rash and other nonspecific skin eruption: Secondary | ICD-10-CM

## 2023-07-13 NOTE — Patient Instructions (Addendum)
 Rhinitis  Return for allergy skin testing. Will make additional recommendations based on results. Make sure you don't take any antihistamines for 3 days before the skin testing appointment. Don't put any lotion on the back and arms on the day of testing.  Must be in good health and not ill. No vaccines/injections/antibiotics within the past 7 days.  Plan on being here for 30-60 minutes. Use Flonase  (fluticasone ) nasal spray 1-2 sprays per nostril once a day as needed for nasal congestion.  Nasal saline spray (i.e., Simply Saline) or nasal saline lavage (i.e., NeilMed) is recommended as needed and prior to medicated nasal sprays.  Rash  Keep track of rashes and take pictures. Write down what you had done during flares.  See below for proper skin care. Use fragrance free and dye free products. No dryer sheets or fabric softener.   Moisturize your skin everyday!  Follow up for skin testing.    Skin care recommendations  Bath time: Always use lukewarm water. AVOID very hot or cold water. Keep bathing time to 5-10 minutes. Do NOT use bubble bath. Use a mild soap and use just enough to wash the dirty areas. Do NOT scrub skin vigorously.  After bathing, pat dry your skin with a towel. Do NOT rub or scrub the skin.  Moisturizers and prescriptions:  ALWAYS apply moisturizers immediately after bathing (within 3 minutes). This helps to lock-in moisture. Use the moisturizer several times a day over the whole body. Good summer moisturizers include: Aveeno, CeraVe, Cetaphil. Good winter moisturizers include: Aquaphor, Vaseline, Cerave, Cetaphil, Eucerin, Vanicream. When using moisturizers along with medications, the moisturizer should be applied about one hour after applying the medication to prevent diluting effect of the medication or moisturize around where you applied the medications. When not using medications, the moisturizer can be continued twice daily as maintenance.  Laundry and  clothing: Avoid laundry products with added color or perfumes. Use unscented hypo-allergenic laundry products such as Tide free, Cheer free & gentle, and All free and clear.  If the skin still seems dry or sensitive, you can try double-rinsing the clothes. Avoid tight or scratchy clothing such as wool. Do not use fabric softeners or dyer sheets.

## 2023-07-28 NOTE — Progress Notes (Deleted)
   Skin testing note  RE: [name] MRN: [id number] DOB: [birth year] Date of Office Visit: 07/29/2023  Referring provider: Mercer Clotilda SAUNDERS, MD Primary care provider: Mercer Clotilda SAUNDERS, MD  Chief Complaint: skin testing  History of Present Illness: I had the pleasure of seeing Michael Durham for a skin testing visit at the [facility] on 07/28/2023. He is a 68 y.o. male, who is being followed for allergic rhinitis and rash. His previous allergy office visit was on 07/13/2023 with Dr. Luke. Today is a skin testing visit.   Discussed the use of AI scribe software for clinical note transcription with the patient, who gave verbal consent to proceed.  History of Present Illness             *** Assessment and Plan: [name] is a 68 y.o. male with: Other allergic rhinitis Chronic allergic rhinitis with perennial symptoms. Partial relief with OTC antihistamines. Recent ENT evaluation showed no nasal polyps. Return for allergy skin testing. Will make additional recommendations based on results. Use Flonase  (fluticasone ) nasal spray 1-2 sprays per nostril once a day as needed for nasal congestion.  Nasal saline spray (i.e., Simply Saline) or nasal saline lavage (i.e., NeilMed) is recommended as needed and prior to medicated nasal sprays.   Rash and other nonspecific skin eruption Chronic rash on legs x 1 year which is pruritic at times, worsened with wool socks. Not moisturizing daily. Keep track of rashes and take pictures. Write down what you had done during flares.  See below for proper skin care. Use fragrance free and dye free products. No dryer sheets or fabric softener.   Moisturize your skin everyday!   Return for Skin testing.  Assessment and Plan              No follow-ups on file.  No orders of the defined types were placed in this encounter.  Lab Orders  No laboratory test(s) ordered today    Diagnostics: Skin Testing: Environmental  allergy panel. *** Results discussed with patient/family.   Previous notes and tests were reviewed. The plan was reviewed with the patient/family, and all questions/concerned were addressed.  It was my pleasure to see Michael Durham today and participate in his care. Please feel free to contact me with any questions or concerns.  Sincerely,  Orlan Luke, DO Allergy & Immunology  [facility]  [facility] office: [phone] [facility] office: [phone]

## 2023-07-29 ENCOUNTER — Ambulatory Visit: Admitting: Allergy

## 2023-07-29 DIAGNOSIS — R21 Rash and other nonspecific skin eruption: Secondary | ICD-10-CM

## 2023-07-29 DIAGNOSIS — J3089 Other allergic rhinitis: Secondary | ICD-10-CM

## 2023-08-11 NOTE — Progress Notes (Unsigned)
 Skin testing note  RE: Blessing Ozga MRN: [id number] DOB: 02-Jul-1955 Date of Office Visit: 08/12/2023  Referring provider: Mercer Clotilda SAUNDERS, MD Primary care provider: Mercer Clotilda SAUNDERS, MD  Chief Complaint: skin testing  History of Present Illness: I had the pleasure of seeing [name] for a skin testing visit at the [facility] on 08/12/2023. He is a 68 y.o. male, who is being followed for allergic rhinitis and rash. His previous allergy  office visit was on 07/13/2023 with Dr. Luke. Today is a skin testing visit.   Discussed the use of AI scribe software for clinical note transcription with the patient, who gave verbal consent to proceed.     He experiences symptoms such as congestion and itching, particularly when exposed to mold. Despite these symptoms, recent skin testing for mold, dust mites, pollen, and other common allergens returned negative results.  He has not undergone blood work for allergies in the past. He currently manages symptoms with a nasal spray as needed.     Assessment and Plan: Theon is a 68 y.o. male with: Chronic rhinitis Past history - Chronic allergic rhinitis with perennial symptoms. Partial relief with OTC antihistamines. Recent ENT evaluation showed no nasal polyps. Today's skin prick testing negative to indoor/outdoor allergens and common foods.  Use Flonase  (fluticasone ) nasal spray 1-2 sprays per nostril once a day as needed for nasal congestion.  Nasal saline spray (i.e., Simply Saline) or nasal saline lavage (i.e., NeilMed) is recommended as needed and prior to medicated nasal sprays. If symptoms worsen - consider getting bloodwork next.    Rash and other nonspecific skin eruption Past history - Chronic rash on legs x 1 year which is pruritic at times, worsened with wool socks. Not moisturizing daily. Keep track of rashes and take pictures. Write down what you had done during flares.  See below for proper skin care. Use  fragrance free and dye free products. No dryer sheets or fabric softener.   Moisturize your skin everyday!  Return if symptoms worsen or fail to improve.  No orders of the defined types were placed in this encounter.  Lab Orders  No laboratory test(s) ordered today    Diagnostics: Skin Testing: Environmental allergy  panel and select foods. Today's skin prick testing negative to indoor/outdoor allergens. Negative to common foods.  Results discussed with patient/family.  Airborne Adult Perc - 08/12/23 1346     Time Antigen Placed 0134    Allergen Manufacturer Jestine    Location Back    Number of Test 55    1. Control-Buffer 50% Glycerol Negative    2. Control-Histamine 3+    3. Bahia Negative    4. French Southern Territories Negative    5. Johnson Negative    6. Kentucky  Blue Negative    7. Meadow Fescue Negative    8. Perennial Rye Negative    9. Timothy Negative    10. Ragweed Mix Negative    11. Cocklebur Negative    12. Plantain,  English Negative    13. Baccharis Negative    14. Dog Fennel Negative    15. Russian Thistle Negative    16. Lamb's Quarters Negative    17. Sheep Sorrell Negative    18. Rough Pigweed Negative    19. Marsh Elder, Rough Negative    20. Mugwort, Common Negative    21. Box, Elder Negative    22. Cedar, red Negative    23. Sweet Gum Negative    24. Pecan  Pollen Negative    25. Pine Mix Negative    26. Walnut, Black Pollen Negative    27. Red Mulberry Negative    28. Ash Mix Negative    29. Birch Mix Negative    30. Beech American Negative    31. Cottonwood, Guinea-Bissau Negative    32. Hickory, White Negative    33. Maple Mix Negative    34. Oak, Guinea-Bissau Mix Negative    35. Sycamore Eastern Negative    36. Alternaria Alternata Negative    37. Cladosporium Herbarum Negative    38. Aspergillus Mix Negative    39. Penicillium Mix Negative    40. Bipolaris Sorokiniana (Helminthosporium) Negative    41. Drechslera Spicifera (Curvularia) Negative    42.  Mucor Plumbeus Negative    43. Fusarium Moniliforme Negative    44. Aureobasidium Pullulans (pullulara) Negative    45. Rhizopus Oryzae Negative    46. Botrytis Cinera Negative    47. Epicoccum Nigrum Negative    48. Phoma Betae Negative    49. Dust Mite Mix Negative    50. Cat Hair 10,000 BAU/ml Negative    51.  Dog Epithelia Negative    52. Mixed Feathers Negative    53. Horse Epithelia Negative    54. Cockroach, German Negative    55. Tobacco Leaf Negative          Food Adult Perc - 08/12/23 1300     Time Antigen Placed 0134    Allergen Manufacturer Jestine    Location Back    Number of allergen test 9    1. Peanut Negative    2. Soybean Negative    3. Wheat Negative    4. Sesame Negative    5. Milk, Cow Negative    6. Casein Negative    7. Egg White, Chicken Negative    8. Shellfish Mix Negative    9. Fish Mix Negative          Previous notes and tests were reviewed. The plan was reviewed with the patient/family, and all questions/concerned were addressed.  It was my pleasure to see Taden today and participate in his care. Please feel free to contact me with any questions or concerns.  Sincerely,  Orlan Cramp, DO Allergy  & Immunology  [facility] [place]  [place] office: [phone] [facility] office: [phone]

## 2023-08-12 ENCOUNTER — Encounter: Payer: Self-pay | Admitting: Allergy

## 2023-08-12 ENCOUNTER — Ambulatory Visit (INDEPENDENT_AMBULATORY_CARE_PROVIDER_SITE_OTHER): Admitting: Allergy

## 2023-08-12 DIAGNOSIS — J3089 Other allergic rhinitis: Secondary | ICD-10-CM

## 2023-08-12 DIAGNOSIS — J310 Chronic rhinitis: Secondary | ICD-10-CM | POA: Diagnosis not present

## 2023-08-12 DIAGNOSIS — R21 Rash and other nonspecific skin eruption: Secondary | ICD-10-CM | POA: Diagnosis not present

## 2023-08-12 NOTE — Patient Instructions (Addendum)
 Today's skin testing negative to indoor/outdoor allergens. Negative to common foods.   Results given.  Sinuses  Use Flonase  (fluticasone ) nasal spray 1-2 sprays per nostril once a day as needed for nasal congestion.  Nasal saline spray (i.e., Simply Saline) or nasal saline lavage (i.e., NeilMed) is recommended as needed and prior to medicated nasal sprays. If symptoms worsen - consider getting bloodwork next.   Rash  Keep track of rashes and take pictures. Write down what you had done during flares.  See below for proper skin care. Use fragrance free and dye free products. No dryer sheets or fabric softener.   Moisturize your skin everyday!  Follow up as needed.   Skin care recommendations  Bath time: Always use lukewarm water. AVOID very hot or cold water. Keep bathing time to 5-10 minutes. Do NOT use bubble bath. Use a mild soap and use just enough to wash the dirty areas. Do NOT scrub skin vigorously.  After bathing, pat dry your skin with a towel. Do NOT rub or scrub the skin.  Moisturizers and prescriptions:  ALWAYS apply moisturizers immediately after bathing (within 3 minutes). This helps to lock-in moisture. Use the moisturizer several times a day over the whole body. Good summer moisturizers include: Aveeno, CeraVe, Cetaphil. Good winter moisturizers include: Aquaphor, Vaseline, Cerave, Cetaphil, Eucerin, Vanicream. When using moisturizers along with medications, the moisturizer should be applied about one hour after applying the medication to prevent diluting effect of the medication or moisturize around where you applied the medications. When not using medications, the moisturizer can be continued twice daily as maintenance.  Laundry and clothing: Avoid laundry products with added color or perfumes. Use unscented hypo-allergenic laundry products such as Tide free, Cheer free & gentle, and All free and clear.  If the skin still seems dry or sensitive, you can try  double-rinsing the clothes. Avoid tight or scratchy clothing such as wool. Do not use fabric softeners or dyer sheets.

## 2023-08-17 ENCOUNTER — Encounter: Admitting: Family Medicine

## 2023-10-02 ENCOUNTER — Telehealth: Payer: Self-pay

## 2023-10-02 ENCOUNTER — Other Ambulatory Visit: Payer: Self-pay | Admitting: Family Medicine

## 2023-10-02 DIAGNOSIS — F902 Attention-deficit hyperactivity disorder, combined type: Secondary | ICD-10-CM

## 2023-10-02 NOTE — Telephone Encounter (Signed)
>>   Oct 02, 2023 11:19 AM Mia F wrote: Pt says he will be going out of town and need medication asap   Copied from CRM [id number]. Topic: Clinical - Medication Refill >> Oct 02, 2023 11:15 AM Mia F wrote: Medication: amphetamine -dextroamphetamine  (ADDERALL) 20 MG tablet   Has the patient contacted their pharmacy? Yes (Agent: If no, request that the patient contact the pharmacy for the refill. If patient does not wish to contact the pharmacy document the reason why and proceed with request.) (Agent: If yes, when and what did the pharmacy advise?)  This is the patient's preferred pharmacy:  [facility] [address] [place] - [address] [place] [address] Phone: [phone] Fax: [phone]  Is this the correct pharmacy for this prescription? Yes If no, delete pharmacy and type the correct one.   Has the prescription been filled recently? Yes  Is the patient out of the medication? No  Has the patient been seen for an appointment in the last year OR does the patient have an upcoming appointment? Yes  Can we respond through MyChart? Yes  Agent: Please be advised that Rx refills may take up to 3 business days. We ask that you follow-up with your pharmacy.

## 2023-10-02 NOTE — Telephone Encounter (Signed)
 Copied from CRM #[id number]. Topic: Clinical - Medication Refill >> Oct 02, 2023 11:15 AM Mia F wrote: Medication: amphetamine -dextroamphetamine  (ADDERALL) 20 MG tablet   Has the patient contacted their pharmacy? Yes (Agent: If no, request that the patient contact the pharmacy for the refill. If patient does not wish to contact the pharmacy document the reason why and proceed with request.) (Agent: If yes, when and what did the pharmacy advise?)  This is the patient's preferred pharmacy:  [facility] [address] [place] - [address] [place] [address] Phone: [phone] Fax: [phone]  Is this the correct pharmacy for this prescription? Yes If no, delete pharmacy and type the correct one.   Has the prescription been filled recently? Yes  Is the patient out of the medication? No  Has the patient been seen for an appointment in the last year OR does the patient have an upcoming appointment? Yes  Can we respond through MyChart? Yes  Agent: Please be advised that Rx refills may take up to 3 business days. We ask that you follow-up with your pharmacy.

## 2023-10-12 ENCOUNTER — Telehealth: Admitting: Family Medicine

## 2023-10-12 ENCOUNTER — Encounter: Payer: Self-pay | Admitting: Family Medicine

## 2023-10-12 DIAGNOSIS — F902 Attention-deficit hyperactivity disorder, combined type: Secondary | ICD-10-CM | POA: Diagnosis not present

## 2023-10-12 DIAGNOSIS — L304 Erythema intertrigo: Secondary | ICD-10-CM | POA: Diagnosis not present

## 2023-10-12 DIAGNOSIS — L578 Other skin changes due to chronic exposure to nonionizing radiation: Secondary | ICD-10-CM

## 2023-10-12 MED ORDER — AMPHETAMINE-DEXTROAMPHETAMINE 20 MG PO TABS
20.0000 mg | ORAL_TABLET | Freq: Two times a day (BID) | ORAL | 0 refills | Status: AC
Start: 2023-10-12 — End: ?

## 2023-10-12 MED ORDER — AMPHETAMINE-DEXTROAMPHETAMINE 20 MG PO TABS: 20.0000 mg | ORAL_TABLET | Freq: Two times a day (BID) | ORAL | 0 refills | Status: AC

## 2023-10-12 MED ORDER — NYSTATIN 100000 UNIT/GM EX CREA: 1.0000 | TOPICAL_CREAM | Freq: Two times a day (BID) | CUTANEOUS | 0 refills | Status: AC

## 2023-10-12 NOTE — Progress Notes (Signed)
 Patient was unable to self-report due to a lack of equipment at home via telehealth

## 2023-10-12 NOTE — Progress Notes (Signed)
 Virtual Visit via Video Note  I connected with Michael Durham on 10/12/23 at 11:00 AM EDT by a video enabled telemedicine application and verified that I am speaking with the correct person using two identifiers.  Location patient: home Location provider:work or home office Persons participating in the virtual visit: patient, provider  I discussed the limitations of evaluation and management by telemedicine and the availability of in person appointments. The patient expressed understanding and agreed to proceed. Chief Complaint  Patient presents with   Acute Visit    Patient has a rash bilateral, lower legs, spots, blisters,  rash also in groin area, started 1 month ago, itching and burning,      HPI: Pt is a 68 yo male seen for f/u and acute concern.  Pt requesting refill on ADHD med, Adderall 20 mg BID.  Stable on med.  No changes in appetite, sleep or mood.  Pt with a rash, more like scaring on b/l lower legs.  Red dots x 89yr.  Happened while at the beach in the sun. Rash is not present elswhere.  Does not itch or burn.    Also with a rash in groin. Peels when rubbed. Tried jock itch cream.  ROS: See pertinent positives and negatives per HPI.  Past Medical History:  Diagnosis Date   ADHD    Allergy     Anxiety    Bipolar 2 disorder (HCC)    Depression    Diverticulosis    ED (erectile dysfunction)    Encephalitis, Age 52    HLD (hyperlipidemia)    Hx of colonic polyp - last colonoscopy with Dr. Nathanial on 12/21/13 08/03/2018   Vitamin D  deficiency     Past Surgical History:  Procedure Laterality Date   BRAIN SURGERY     COLONOSCOPY     CYST EXCISION Right 06/14/2021   Procedure: EXCISION RETINACULAR CYST RIGHT MIDDLE FINGER;  Surgeon: Murrell Drivers, MD;  Location: [facility];  Service: Orthopedics;  Laterality: Right;  30 MIN   MOUTH SURGERY     TOE SURGERY Left    L big toe   TRIGGER FINGER RELEASE Right 06/14/2021   Procedure: A1 PULLEY RELEASE RIGHT MIDDLE  FINGER;  Surgeon: Murrell Drivers, MD;  Location: [facility];  Service: Orthopedics;  Laterality: Right;    Family History  Problem Relation Age of Onset   Dementia Mother    Prostate cancer Father    Bladder Cancer Father    Allergic rhinitis Brother    Diabetes Brother    Breast cancer Maternal Aunt    Esophageal cancer Neg Hx    Stomach cancer Neg Hx    Pancreatic cancer Neg Hx    Inflammatory bowel disease Neg Hx    Liver disease Neg Hx    Rectal cancer Neg Hx    Colon cancer Neg Hx     SOCIAL HX:   Current Outpatient Medications:    amphetamine -dextroamphetamine  (ADDERALL) 20 MG tablet, Take 1 tablet (20 mg total) by mouth 2 (two) times daily., Disp: 60 tablet, Rfl: 0   amphetamine -dextroamphetamine  (ADDERALL) 20 MG tablet, Take 1 tablet (20 mg total) by mouth 2 (two) times daily., Disp: 60 tablet, Rfl: 0   amphetamine -dextroamphetamine  (ADDERALL) 20 MG tablet, Take 1 tablet (20 mg total) by mouth 2 (two) times daily., Disp: 60 tablet, Rfl: 0   ARIPiprazole  (ABILIFY ) 5 MG tablet, Take 1 tablet (5 mg total) by mouth at bedtime., Disp: 90 tablet, Rfl: 3   aspirin   EC 81 MG tablet, Take 1 tablet (81 mg total) by mouth daily. Swallow whole., Disp: 90 tablet, Rfl: 3   Cholecalciferol (VITAMIN D ) 2000 units CAPS, Take 2,000 Units by mouth daily., Disp: , Rfl:    escitalopram  (LEXAPRO ) 20 MG tablet, Take 1 tablet by mouth once daily, Disp: 90 tablet, Rfl: 3   Omega-3 Fatty Acids (FISH OIL) 1200 MG CAPS, Take 1 capsule by mouth daily at 12 noon., Disp: , Rfl:    tadalafil  (CIALIS ) 5 MG tablet, 1-3 tabs po daily as needed., Disp: 30 tablet, Rfl: 5   tamsulosin  (FLOMAX ) 0.4 MG CAPS capsule, Take 1 capsule (0.4 mg total) by mouth daily., Disp: 90 capsule, Rfl: 3   fluticasone  (FLONASE ) 50 MCG/ACT nasal spray, Place 2 sprays into both nostrils daily. (Patient not taking: Reported on 07/13/2023), Disp: 16 g, Rfl: 6  EXAM:  VITALS per patient if applicable:  RR between 12-20  bpm  GENERAL: alert, oriented, appears well and in no acute distress  HEENT: atraumatic, conjunctiva clear, no obvious abnormalities on inspection of external nose and ears  NECK: normal movements of the head and neck  LUNGS: on inspection no signs of respiratory distress, breathing rate appears normal, no obvious gross SOB, gasping or wheezing  CV: no obvious cyanosis  MS: moves all visible extremities without noticeable abnormality  PSYCH/NEURO: pleasant and cooperative, no obvious depression or anxiety, speech and thought processing grossly intact  SKIN: b/l LE with erythematous lesions of b/l LEs  ASSESSMENT AND PLAN:  Discussed the following assessment and plan:  Dermatitis due to sun - Plan: Ambulatory referral to Dermatology  Attention deficit hyperactivity disorder (ADHD), combined type - Plan: amphetamine -dextroamphetamine  (ADDERALL) 20 MG tablet, amphetamine -dextroamphetamine  (ADDERALL) 20 MG tablet, amphetamine -dextroamphetamine  (ADDERALL) 20 MG tablet  Intertrigo - Plan: Ambulatory referral to Dermatology, nystatin cream (MYCOSTATIN)  Erythematous lesions on LEs after sun exposure.  Nonpruritic.  Difficult to assess via picture from video visit.  Consider psoriasis.  Referred to dermatology for further evaluation and treatment.  Possible intertrigo.  Start nystatin cream.  Follow-up with dermatology.  ADHD well-controlled on Adderall 20 mg twice daily.  PDMP reviewed and appropriate.  Continue current medications.  57-month refill sent.  Follow-up in 3 months for refills.   Follow-up as needed for continued or worsening symptoms. I discussed the assessment and treatment plan with the patient. The patient was provided an opportunity to ask questions and all were answered. The patient agreed with the plan and demonstrated an understanding of the instructions.   The patient was advised to call back or seek an in-person evaluation if the symptoms worsen or if the condition  fails to improve as anticipated.   Clotilda JONELLE Single, MD

## 2024-02-10 ENCOUNTER — Telehealth: Payer: Self-pay

## 2024-02-10 NOTE — Telephone Encounter (Signed)
 Copied from CRM [id number]. Topic: Clinical - Medication Refill >> Feb 10, 2024 10:50 AM Maisie C wrote: Medication: Adderall  Has the patient contacted their pharmacy? Yes Controlled substances  This is the patient's preferred pharmacy:  [facility] [address], [place] - [address] [place] [address] Phone: [phone] Fax: [phone]  Is this the correct pharmacy for this prescription? Yes If no, delete pharmacy and type the correct one.   Has the prescription been filled recently? No  Is the patient out of the medication? No  Has the patient been seen for an appointment in the last year OR does the patient have an upcoming appointment? Yes  Can we respond through MyChart? Yes  Agent: Please be advised that Rx refills may take up to 3 business days. We ask that you follow-up with your pharmacy.

## 2024-02-19 ENCOUNTER — Ambulatory Visit: Admitting: Family Medicine
